# Patient Record
Sex: Female | Born: 1955 | Race: White | Hispanic: No | Marital: Married | Smoking: Never smoker
Health system: Southern US, Community
[De-identification: ages and names within clinical notes are randomized; demographics above are authoritative.]

## PROBLEM LIST (undated history)

## (undated) DIAGNOSIS — R209 Unspecified disturbances of skin sensation: Principal | ICD-10-CM

## (undated) DIAGNOSIS — M25552 Pain in left hip: Secondary | ICD-10-CM

## (undated) DIAGNOSIS — M1990 Unspecified osteoarthritis, unspecified site: Secondary | ICD-10-CM

## (undated) DIAGNOSIS — T8859XA Other complications of anesthesia, initial encounter: Secondary | ICD-10-CM

## (undated) DIAGNOSIS — T4145XA Adverse effect of unspecified anesthetic, initial encounter: Secondary | ICD-10-CM

## (undated) DIAGNOSIS — F419 Anxiety disorder, unspecified: Secondary | ICD-10-CM

## (undated) DIAGNOSIS — E785 Hyperlipidemia, unspecified: Secondary | ICD-10-CM

## (undated) DIAGNOSIS — N200 Calculus of kidney: Secondary | ICD-10-CM

## (undated) DIAGNOSIS — I10 Essential (primary) hypertension: Secondary | ICD-10-CM

## (undated) DIAGNOSIS — R112 Nausea with vomiting, unspecified: Secondary | ICD-10-CM

## (undated) DIAGNOSIS — F32A Depression, unspecified: Secondary | ICD-10-CM

## (undated) DIAGNOSIS — Z87442 Personal history of urinary calculi: Secondary | ICD-10-CM

## (undated) DIAGNOSIS — Z9889 Other specified postprocedural states: Secondary | ICD-10-CM

## (undated) HISTORY — DX: Anxiety disorder, unspecified: F41.9

## (undated) HISTORY — PX: HIP SURGERY: SHX245

## (undated) HISTORY — PX: OVARIAN CYST REMOVAL: SHX89

## (undated) HISTORY — PX: TONSILLECTOMY: SUR1361

## (undated) HISTORY — PX: ABDOMINAL HYSTERECTOMY: SHX81

## (undated) HISTORY — DX: Unspecified disturbances of skin sensation: R20.9

---

## 1998-04-29 ENCOUNTER — Observation Stay (HOSPITAL_COMMUNITY): Admission: RE | Admit: 1998-04-29 | Discharge: 1998-04-30 | Payer: Self-pay | Admitting: Obstetrics & Gynecology

## 2000-05-17 ENCOUNTER — Other Ambulatory Visit: Admission: RE | Admit: 2000-05-17 | Discharge: 2000-05-17 | Payer: Self-pay | Admitting: Obstetrics & Gynecology

## 2001-11-21 ENCOUNTER — Other Ambulatory Visit: Admission: RE | Admit: 2001-11-21 | Discharge: 2001-11-21 | Payer: Self-pay | Admitting: Obstetrics & Gynecology

## 2002-11-27 ENCOUNTER — Other Ambulatory Visit: Admission: RE | Admit: 2002-11-27 | Discharge: 2002-11-27 | Payer: Self-pay | Admitting: Obstetrics & Gynecology

## 2003-12-24 ENCOUNTER — Other Ambulatory Visit: Admission: RE | Admit: 2003-12-24 | Discharge: 2003-12-24 | Payer: Self-pay | Admitting: Obstetrics & Gynecology

## 2005-03-17 ENCOUNTER — Other Ambulatory Visit: Admission: RE | Admit: 2005-03-17 | Discharge: 2005-03-17 | Payer: Self-pay | Admitting: Obstetrics & Gynecology

## 2006-05-04 ENCOUNTER — Encounter: Admission: RE | Admit: 2006-05-04 | Discharge: 2006-05-04 | Payer: Self-pay | Admitting: Family Medicine

## 2006-07-12 ENCOUNTER — Encounter: Admission: RE | Admit: 2006-07-12 | Discharge: 2006-07-12 | Payer: Self-pay | Admitting: Interventional Radiology

## 2006-07-21 ENCOUNTER — Encounter: Admission: RE | Admit: 2006-07-21 | Discharge: 2006-07-21 | Payer: Self-pay | Admitting: Interventional Radiology

## 2007-03-14 ENCOUNTER — Encounter: Admission: RE | Admit: 2007-03-14 | Discharge: 2007-03-14 | Payer: Self-pay | Admitting: Interventional Radiology

## 2010-07-26 ENCOUNTER — Encounter: Payer: Self-pay | Admitting: Interventional Radiology

## 2011-07-12 ENCOUNTER — Encounter (HOSPITAL_COMMUNITY): Payer: Self-pay | Admitting: Pharmacy Technician

## 2011-07-13 NOTE — H&P (Signed)
  Sheila Allen DOB: 1956-03-12  Chief Complaint: Right hip and leg pain   History of Present Illness The patient is a 56 year old female who presents today for follow up of their hip. The patient is being followed for their left hip pain and trochanteric bursitis. They are 7 week(s) out from since last injection. Symptoms reported today include: pain and pain at night. The patient feels that they are doing poorly and report their pain level to be moderate. Current treatment includes: physical therapy (only went to sessions). The patient has reported improvement of their symptoms with: Cortisone injections (helped for 2weeks). Patient states now having buttock pain with no radiation down the left leg  Problem List/Past Medical Pain in joint, pelvis/thigh (719.45). 04/16/2005 Enthesopathy, hip (726.5). 04/16/2005 Enthesopathy, hip (726.5). 11/02/2010   Allergies No Known Drug Allergies. 03/03/2011  Family History None. 03/03/2011  Social History Tobacco use. Never smoker. Alcohol use. Occasional alcohol use.  Medication History hormone patch Active. Hydrochlorothiazide (12.5MG  Capsule, Oral) Active. Aleve (220MG  Capsule, 1 Oral) Active.  Review of Systems General:Not Present- Chills, Fever, Night Sweats, Fatigue, Weight Gain, Weight Loss and Memory Loss. Skin:Not Present- Hives, Itching, Rash, Eczema and Lesions. HEENT:Not Present- Tinnitus, Headache, Double Vision, Visual Loss, Hearing Loss and Dentures. Respiratory:Not Present- Shortness of breath with exertion, Shortness of breath at rest, Allergies, Coughing up blood and Chronic Cough. Cardiovascular:Not Present- Chest Pain, Racing/skipping heartbeats, Difficulty Breathing Lying Down, Murmur, Swelling and Palpitations. Gastrointestinal:Not Present- Bloody Stool, Heartburn, Abdominal Pain, Vomiting, Nausea, Constipation, Diarrhea, Difficulty Swallowing, Jaundice and Loss of appetitie. Female  Genitourinary:Not Present- Blood in Urine, Urinary frequency, Weak urinary stream, Discharge, Flank Pain, Incontinence, Painful Urination, Urgency, Urinary Retention and Urinating at Night. Musculoskeletal:Present- Muscle Pain and Spasms. Not Present- Muscle Weakness, Joint Swelling, Joint Pain, Back Pain and Morning Stiffness. Neurological:Not Present- Tremor, Dizziness, Blackout spells, Paralysis, Difficulty with balance and Weakness. Psychiatric:Not Present- Insomnia.  Physical Exam  She is a well-developed, well-nourished 55 year old female. She is alert and oriented x 3. No acute distress. She is still having severe pain with palpation over that greater trochanteric bursa. She has mild pain with abduction of the left hip. No pain with flexion, internal or external rotation. Sensation and circulation intact. No motor deficits. Calves are soft and nontender.  Lungs clear to auscultation. Heart sounds normal. Regular rate and rhythm. No murmurs. Abdomen soft and nontender. Bowel sounds active. Neurologically grossly intact. Neck supple. No carotid bruit. EOM intact.   RADIOGRAPHS:  Dr. Darrelyn Hillock put a BB marker on it, took an x-ray. The pain is well below the greater trochanter where the insertion of the gluteus medius is. She had a previous MRI that shows a partial gluteus medius tear there.  Assessment & Plan Enthesopathy, hip (726.5)  Plans  She is really tired of the injections, they are not lasting long. The only other procedure would be surgery and we discussed that. Dr. Darrelyn Hillock told her it is a 50/50 chance and also there is a possibility of infection which is rare. We would have to put her in overnight observation. We will go ahead and schedule her for iliotibial band release and repair of the gluteus medius left hip. We will do it under general anesthesia.   Dimitri Ped, PA-C

## 2011-07-15 ENCOUNTER — Encounter (HOSPITAL_COMMUNITY): Payer: Self-pay

## 2011-07-15 ENCOUNTER — Ambulatory Visit (HOSPITAL_COMMUNITY)
Admission: RE | Admit: 2011-07-15 | Discharge: 2011-07-15 | Disposition: A | Discharge status: Home / Self Care | Payer: Federal, State, Local not specified - PPO | Source: Ambulatory Visit | Attending: Orthopedic Surgery | Admitting: Orthopedic Surgery

## 2011-07-15 ENCOUNTER — Encounter (HOSPITAL_COMMUNITY)
Admission: RE | Admit: 2011-07-15 | Discharge: 2011-07-15 | Disposition: A | Discharge status: Home / Self Care | Payer: Federal, State, Local not specified - PPO | Source: Ambulatory Visit | Attending: Orthopedic Surgery | Admitting: Orthopedic Surgery

## 2011-07-15 DIAGNOSIS — Z01818 Encounter for other preprocedural examination: Secondary | ICD-10-CM | POA: Insufficient documentation

## 2011-07-15 DIAGNOSIS — Z01812 Encounter for preprocedural laboratory examination: Secondary | ICD-10-CM | POA: Insufficient documentation

## 2011-07-15 HISTORY — DX: Pain in left hip: M25.552

## 2011-07-15 HISTORY — DX: Essential (primary) hypertension: I10

## 2011-07-15 LAB — DIFFERENTIAL
Basophils Absolute: 0 10*3/uL (ref 0.0–0.1)
Basophils Relative: 1 % (ref 0–1)
Eosinophils Absolute: 0.3 10*3/uL (ref 0.0–0.7)
Eosinophils Relative: 5 % (ref 0–5)
Lymphocytes Relative: 26 % (ref 12–46)
Lymphs Abs: 1.7 10*3/uL (ref 0.7–4.0)
Monocytes Absolute: 0.6 10*3/uL (ref 0.1–1.0)
Monocytes Relative: 9 % (ref 3–12)
Neutro Abs: 3.7 10*3/uL (ref 1.7–7.7)
Neutrophils Relative %: 59 % (ref 43–77)

## 2011-07-15 LAB — URINALYSIS, ROUTINE W REFLEX MICROSCOPIC
Bilirubin Urine: NEGATIVE
Glucose, UA: NEGATIVE mg/dL
Hgb urine dipstick: NEGATIVE
Ketones, ur: NEGATIVE mg/dL
Leukocytes, UA: NEGATIVE
Nitrite: NEGATIVE
Protein, ur: NEGATIVE mg/dL
Specific Gravity, Urine: 1.023 (ref 1.005–1.030)
Urobilinogen, UA: 1 mg/dL (ref 0.0–1.0)
pH: 7 (ref 5.0–8.0)

## 2011-07-15 LAB — CBC
HCT: 37.3 % (ref 36.0–46.0)
Hemoglobin: 12.6 g/dL (ref 12.0–15.0)
MCH: 29.1 pg (ref 26.0–34.0)
MCHC: 33.8 g/dL (ref 30.0–36.0)
MCV: 86.1 fL (ref 78.0–100.0)
Platelets: 396 10*3/uL (ref 150–400)
RBC: 4.33 MIL/uL (ref 3.87–5.11)
RDW: 12.7 % (ref 11.5–15.5)
WBC: 6.4 10*3/uL (ref 4.0–10.5)

## 2011-07-15 LAB — APTT: aPTT: 38 seconds — ABNORMAL HIGH (ref 24–37)

## 2011-07-15 LAB — COMPREHENSIVE METABOLIC PANEL
ALT: 25 U/L (ref 0–35)
AST: 23 U/L (ref 0–37)
Albumin: 3.7 g/dL (ref 3.5–5.2)
Alkaline Phosphatase: 63 U/L (ref 39–117)
BUN: 16 mg/dL (ref 6–23)
CO2: 32 mEq/L (ref 19–32)
Calcium: 9.5 mg/dL (ref 8.4–10.5)
Chloride: 102 mEq/L (ref 96–112)
Creatinine, Ser: 0.88 mg/dL (ref 0.50–1.10)
GFR calc Af Amer: 84 mL/min — ABNORMAL LOW (ref >90)
GFR calc non Af Amer: 72 mL/min — ABNORMAL LOW (ref >90)
Glucose, Bld: 96 mg/dL (ref 70–99)
Potassium: 3.5 mEq/L (ref 3.5–5.1)
Sodium: 141 mEq/L (ref 135–145)
Total Bilirubin: 0.6 mg/dL (ref 0.3–1.2)
Total Protein: 7.1 g/dL (ref 6.0–8.3)

## 2011-07-15 LAB — PROTIME-INR
INR: 0.97 (ref 0.00–1.49)
Prothrombin Time: 13.1 seconds (ref 11.6–15.2)

## 2011-07-15 NOTE — Patient Instructions (Addendum)
20 Sheila Allen  07/15/2011   Your procedure is scheduled on: 07/20/11  Report to [facility] at 10:00 AM.  Call this number if you have problems the morning of surgery: [phone]   Remember:   Do not eat food:After Midnight.  May have clear liquids: up to 4 Hours before arrival.(6:00 AM)  Clear liquids include soda, tea, black coffee, apple or grape juice, broth.  Take these medicines the morning of surgery with A SIP OF WATER: NONE   Do not wear jewelry, make-up or nail polish.  Do not wear lotions, powders, or perfumes. You may wear deodorant.  Do not shave 48 hours prior to surgery.  Do not bring valuables to the hospital.  Contacts, dentures or bridgework may not be worn into surgery.  Leave suitcase in the car. After surgery it may be brought to your room.  For patients admitted to the hospital, checkout time is 11:00 AM the day of discharge.   Patients discharged the day of surgery will not be allowed to drive home.  Name and phone number of your driver:   Special Instructions: CHG Shower Use Special Wash: 1/2 bottle night before surgery and 1/2 bottle morning of surgery.   Please read over the following fact sheets that you were given: MRSA Information

## 2011-07-20 ENCOUNTER — Encounter (HOSPITAL_COMMUNITY): Payer: Self-pay | Admitting: *Deleted

## 2011-07-20 ENCOUNTER — Encounter (HOSPITAL_COMMUNITY)
Admission: RE | Disposition: A | Discharge status: Home / Self Care | Payer: Self-pay | Source: Ambulatory Visit | Attending: Orthopedic Surgery

## 2011-07-20 ENCOUNTER — Ambulatory Visit (HOSPITAL_COMMUNITY): Payer: Federal, State, Local not specified - PPO | Admitting: *Deleted

## 2011-07-20 ENCOUNTER — Ambulatory Visit (HOSPITAL_COMMUNITY)
Admission: RE | Admit: 2011-07-20 | Discharge: 2011-07-21 | Disposition: A | Discharge status: Home / Self Care | Payer: Federal, State, Local not specified - PPO | Source: Ambulatory Visit | Attending: Orthopedic Surgery | Admitting: Orthopedic Surgery

## 2011-07-20 DIAGNOSIS — X58XXXA Exposure to other specified factors, initial encounter: Secondary | ICD-10-CM | POA: Insufficient documentation

## 2011-07-20 DIAGNOSIS — M25559 Pain in unspecified hip: Secondary | ICD-10-CM | POA: Insufficient documentation

## 2011-07-20 DIAGNOSIS — Z9889 Other specified postprocedural states: Secondary | ICD-10-CM

## 2011-07-20 DIAGNOSIS — Z90710 Acquired absence of both cervix and uterus: Secondary | ICD-10-CM | POA: Insufficient documentation

## 2011-07-20 DIAGNOSIS — M7072 Other bursitis of hip, left hip: Secondary | ICD-10-CM | POA: Diagnosis present

## 2011-07-20 DIAGNOSIS — R112 Nausea with vomiting, unspecified: Secondary | ICD-10-CM | POA: Insufficient documentation

## 2011-07-20 DIAGNOSIS — IMO0002 Reserved for concepts with insufficient information to code with codable children: Secondary | ICD-10-CM | POA: Insufficient documentation

## 2011-07-20 DIAGNOSIS — M76899 Other specified enthesopathies of unspecified lower limb, excluding foot: Secondary | ICD-10-CM | POA: Insufficient documentation

## 2011-07-20 LAB — TYPE AND SCREEN
ABO/RH(D): A NEG
Antibody Screen: NEGATIVE

## 2011-07-20 LAB — ABO/RH: ABO/RH(D): A NEG

## 2011-07-20 SURGERY — EXCISION BURSA WITH ILIAL-TIBIAL BAND RELEASE
Anesthesia: General | Site: Hip | Laterality: Left | Wound class: Clean

## 2011-07-20 MED ORDER — HYDROMORPHONE HCL PF 1 MG/ML IJ SOLN
0.2500 mg | INTRAMUSCULAR | Status: DC | PRN
Start: 2011-07-20 — End: 2011-07-20
  Administered 2011-07-20 (×4): 0.5 mg via INTRAVENOUS

## 2011-07-20 MED ORDER — PROPOFOL 10 MG/ML IV EMUL
INTRAVENOUS | Status: DC | PRN
  Administered 2011-07-20: 170 mg via INTRAVENOUS

## 2011-07-20 MED ORDER — CEFAZOLIN SODIUM 1-5 GM-% IV SOLN
1.0000 g | Freq: Four times a day (QID) | INTRAVENOUS | Status: AC
  Administered 2011-07-20 – 2011-07-21 (×3): 1 g via INTRAVENOUS
  Filled 2011-07-20 (×3): qty 50

## 2011-07-20 MED ORDER — METHOCARBAMOL 100 MG/ML IJ SOLN
500.0000 mg | Freq: Four times a day (QID) | INTRAVENOUS | Status: DC | PRN
  Administered 2011-07-20: 500 mg via INTRAVENOUS
  Filled 2011-07-20: qty 5

## 2011-07-20 MED ORDER — LACTATED RINGERS IV SOLN
INTRAVENOUS | Status: DC
  Administered 2011-07-20: 1000 mL via INTRAVENOUS
  Administered 2011-07-20: 14:00:00 via INTRAVENOUS

## 2011-07-20 MED ORDER — HYDROCODONE-ACETAMINOPHEN 5-325 MG PO TABS: 1.0000 | ORAL_TABLET | ORAL | Status: DC | PRN

## 2011-07-20 MED ORDER — PROMETHAZINE HCL 25 MG/ML IJ SOLN
12.5000 mg | Freq: Four times a day (QID) | INTRAMUSCULAR | Status: DC | PRN
  Administered 2011-07-20: 12.5 mg via INTRAVENOUS
  Filled 2011-07-20: qty 1

## 2011-07-20 MED ORDER — POLYETHYLENE GLYCOL 3350 17 G PO PACK
17.0000 g | PACK | Freq: Every day | ORAL | Status: DC | PRN
  Filled 2011-07-20: qty 1

## 2011-07-20 MED ORDER — FENTANYL CITRATE 0.05 MG/ML IJ SOLN
INTRAMUSCULAR | Status: DC | PRN
  Administered 2011-07-20: 50 ug via INTRAVENOUS
  Administered 2011-07-20: 100 ug via INTRAVENOUS
  Administered 2011-07-20: 25 ug via INTRAVENOUS

## 2011-07-20 MED ORDER — PHENOL 1.4 % MT LIQD
1.0000 | OROMUCOSAL | Status: DC | PRN
  Filled 2011-07-20: qty 177

## 2011-07-20 MED ORDER — ONDANSETRON HCL 4 MG PO TABS: 4.0000 mg | ORAL_TABLET | Freq: Four times a day (QID) | ORAL | Status: DC | PRN

## 2011-07-20 MED ORDER — HYDROCHLOROTHIAZIDE 25 MG PO TABS
25.0000 mg | ORAL_TABLET | Freq: Every day | ORAL | Status: DC
  Administered 2011-07-21: 25 mg via ORAL
  Filled 2011-07-20 (×3): qty 1

## 2011-07-20 MED ORDER — HYDROMORPHONE HCL PF 1 MG/ML IJ SOLN
0.5000 mg | INTRAMUSCULAR | Status: DC | PRN
  Administered 2011-07-20 – 2011-07-21 (×4): 1 mg via INTRAVENOUS
  Filled 2011-07-20 (×4): qty 1

## 2011-07-20 MED ORDER — CEFAZOLIN SODIUM 1-5 GM-% IV SOLN
1.0000 g | INTRAVENOUS | Status: AC
  Administered 2011-07-20: 1 g via INTRAVENOUS

## 2011-07-20 MED ORDER — METHOCARBAMOL 500 MG PO TABS: 500.0000 mg | ORAL_TABLET | Freq: Four times a day (QID) | ORAL | Status: DC | PRN

## 2011-07-20 MED ORDER — PROMETHAZINE HCL 25 MG/ML IJ SOLN: 6.2500 mg | INTRAMUSCULAR | Status: DC | PRN

## 2011-07-20 MED ORDER — ACETAMINOPHEN 10 MG/ML IV SOLN
INTRAVENOUS | Status: DC | PRN
  Administered 2011-07-20: 1000 mg via INTRAVENOUS

## 2011-07-20 MED ORDER — SODIUM CHLORIDE 0.9 % IR SOLN
Status: DC | PRN
  Administered 2011-07-20: 13:00:00

## 2011-07-20 MED ORDER — MENTHOL 3 MG MT LOZG
1.0000 | LOZENGE | OROMUCOSAL | Status: DC | PRN
  Filled 2011-07-20: qty 9

## 2011-07-20 MED ORDER — HYDROMORPHONE HCL PF 1 MG/ML IJ SOLN
INTRAMUSCULAR | Status: AC
  Filled 2011-07-20: qty 1

## 2011-07-20 MED ORDER — LACTATED RINGERS IV SOLN
INTRAVENOUS | Status: DC
  Administered 2011-07-20 – 2011-07-21 (×3): via INTRAVENOUS

## 2011-07-20 MED ORDER — SUCCINYLCHOLINE CHLORIDE 20 MG/ML IJ SOLN
INTRAMUSCULAR | Status: DC | PRN
  Administered 2011-07-20: 100 mg via INTRAVENOUS

## 2011-07-20 MED ORDER — MIDAZOLAM HCL 5 MG/5ML IJ SOLN
INTRAMUSCULAR | Status: DC | PRN
  Administered 2011-07-20: 2 mg via INTRAVENOUS

## 2011-07-20 MED ORDER — BISACODYL 10 MG RE SUPP: 10.0000 mg | Freq: Every day | RECTAL | Status: DC | PRN

## 2011-07-20 MED ORDER — FLEET ENEMA 7-19 GM/118ML RE ENEM: 1.0000 | ENEMA | Freq: Once | RECTAL | Status: AC | PRN

## 2011-07-20 MED ORDER — ASPIRIN 325 MG PO TABS
325.0000 mg | ORAL_TABLET | Freq: Two times a day (BID) | ORAL | Status: DC
  Administered 2011-07-20: 325 mg via ORAL
  Filled 2011-07-20 (×5): qty 1

## 2011-07-20 MED ORDER — OXYCODONE-ACETAMINOPHEN 5-325 MG PO TABS
1.0000 | ORAL_TABLET | ORAL | Status: DC | PRN
  Administered 2011-07-21: 2 via ORAL
  Filled 2011-07-20: qty 2

## 2011-07-20 MED ORDER — ONDANSETRON HCL 4 MG/2ML IJ SOLN
4.0000 mg | Freq: Four times a day (QID) | INTRAMUSCULAR | Status: DC | PRN
  Administered 2011-07-20 – 2011-07-21 (×2): 4 mg via INTRAVENOUS
  Filled 2011-07-20 (×2): qty 2

## 2011-07-20 SURGICAL SUPPLY — 28 items
BAG SPEC THK2 15X12 ZIP CLS (MISCELLANEOUS) ×1
BAG ZIPLOCK 12X15 (MISCELLANEOUS) ×2 IMPLANT
CLOSURE STERI STRIP 1/2 X4 (GAUZE/BANDAGES/DRESSINGS) ×2 IMPLANT
CLOTH BEACON ORANGE TIMEOUT ST (SAFETY) ×2 IMPLANT
DRAPE ORTHO SPLIT 77X108 STRL (DRAPES) ×2
DRAPE SURG ORHT 6 SPLT 77X108 (DRAPES) ×1 IMPLANT
DRAPE U-SHAPE 47X51 STRL (DRAPES) ×1 IMPLANT
DRSG MEPILEX BORDER 4X8 (GAUZE/BANDAGES/DRESSINGS) ×2 IMPLANT
DURAPREP 26ML APPLICATOR (WOUND CARE) ×2 IMPLANT
ELECT BLADE TIP CTD 4 INCH (ELECTRODE) ×1 IMPLANT
ELECT REM PT RETURN 9FT ADLT (ELECTROSURGICAL) ×2
ELECTRODE REM PT RTRN 9FT ADLT (ELECTROSURGICAL) ×1 IMPLANT
GLOVE BIOGEL PI IND STRL 8.5 (GLOVE) ×1 IMPLANT
GLOVE BIOGEL PI INDICATOR 8.5 (GLOVE) ×1
GLOVE ECLIPSE 8.0 STRL XLNG CF (GLOVE) ×2 IMPLANT
GOWN PREVENTION PLUS LG XLONG (DISPOSABLE) ×3 IMPLANT
GOWN STRL REIN XL XLG (GOWN DISPOSABLE) ×3 IMPLANT
KIT BASIN OR (CUSTOM PROCEDURE TRAY) ×2 IMPLANT
MANIFOLD NEPTUNE II (INSTRUMENTS) ×2 IMPLANT
NS IRRIG 1000ML POUR BTL (IV SOLUTION) IMPLANT
PACK TOTAL JOINT (CUSTOM PROCEDURE TRAY) ×2 IMPLANT
POSITIONER SURGICAL ARM (MISCELLANEOUS) ×2 IMPLANT
SPONGE GAUZE 4X4 12PLY (GAUZE/BANDAGES/DRESSINGS) ×2 IMPLANT
SUT VIC AB 0 CT1 36 (SUTURE) ×2 IMPLANT
SUT VIC AB 1 CT1 27 (SUTURE) ×6
SUT VIC AB 1 CT1 27XBRD ANTBC (SUTURE) ×3 IMPLANT
TOWEL OR 17X26 10 PK STRL BLUE (TOWEL DISPOSABLE) ×4 IMPLANT
WATER STERILE IRR 1500ML POUR (IV SOLUTION) IMPLANT

## 2011-07-20 NOTE — Op Note (Signed)
Sheila Allen, Sheila Allen NO.:  [id number]  MEDICAL RECORD NO.:  [id number]  LOCATION:  1435                         FACILITY:  [facility]  PHYSICIAN:  Georges Lynch. Lynard Postlewait, M.D.DATE OF BIRTH:  1956-04-10  DATE OF PROCEDURE:  07/20/2011 DATE OF DISCHARGE:                              OPERATIVE REPORT   SURGEON:  Georges Lynch. Darrelyn Hillock, MD  ASSISTANT:  Dimitri Ped, PA  PREOPERATIVE DIAGNOSES: 1. Chronic greater trochanteric bursitis, left hip. 2. Partial tear of the gluteus medius, left hip.  POSTOPERATIVE DIAGNOSES: 1. Chronic greater trochanteric bursitis, left hip. 2. Partial tear of the gluteus medius, left hip.  OPERATION DONE: 1. Repair of a small tear of the gluteus medius, left hip. 2. An elliptical excision of a portion of the iliotibial band. 3. Bursectomy of the greater trochanteric bursa.  PROCEDURE IN DETAIL:  Under general anesthesia, routine orthopedic prepping and draping of the left hip was carried out with the right hip down and left side up.  The appropriate time-out was carried out prior to the surgery.  I also marked the appropriate left hip in the holding area.  The patient had 1 g of IV Ancef.  At this time, an incision was made over the lateral aspect of the left hip.  Bleeders identified and cauterized.  I carefully dissected the subcutaneous tissue down off the iliotibial band.  I then took an elliptical excision of a portion of the iliotibial band.  Note, this was under a severe amount of tension.  Once this was done, I went down and identified the erythematous bursa, I removed the erythematous greater trochanteric bursa.  There was a small tear in the gluteus medius that I repaired it well.  I thoroughly irrigated out the area.  I left the remaining part of the iliotibial band open.  I then closed the subcutaneous with 0 Vicryl and continued to close the remaining part of the wound in usual fashion.     ______________________________ Georges Lynch Darrelyn Hillock, M.D.     RAG/MEDQ  D:  07/20/2011  T:  07/20/2011  Job:  [id number]

## 2011-07-20 NOTE — Interval H&P Note (Signed)
History and Physical Interval Note:  07/20/2011 12:45 PM  Sheila Allen  has presented today for surgery, with the diagnosis of left hip pain  The various methods of treatment have been discussed with the patient and family. After consideration of risks, benefits and other options for treatment, the patient has consented to  Procedure(s): EXCISION BURSA WITH ILIAL-TIBIAL BAND RELEASE as a surgical intervention .  The patients' history has been reviewed, patient examined, no change in status, stable for surgery.  I have reviewed the patients' chart and labs.  Questions were answered to the patient's satisfaction.     Severa Jeremiah A

## 2011-07-20 NOTE — Progress Notes (Signed)
Pt c/o n/v. zofran not due until 1045. Pa shufford called orders received pt informed.

## 2011-07-20 NOTE — Brief Op Note (Signed)
07/20/2011  1:43 PM  PATIENT:  Sheila Allen  56 y.o. female  PRE-OPERATIVE DIAGNOSIS:  left hip pain  POST-OPERATIVE DIAGNOSIS:  left hip pain  PROCEDURE:  Procedure(s): EXCISION BURSA WITH ILIAL-TIBIAL BAND RELEASE  SURGEON:  Surgeon(s): Chanti Golubski A Consuelo Thayne  PHYSICIAN ASSISTANT:   ASSISTANTS: Pharmacist, community PA   ANESTHESIA:   general  EBL:   10cc  BLOOD ADMINISTERED:none  DRAINS: none   LOCAL MEDICATIONS USED:  NONE  SPECIMEN:  No Specimen  DISPOSITION OF SPECIMEN:  N/A  COUNTS:  YES  TOURNIQUET:  * No tourniquets in log *  DICTATION: .Other Dictation: Dictation Number [id number]  PLAN OF CARE: Admit for overnight observation

## 2011-07-20 NOTE — Anesthesia Preprocedure Evaluation (Addendum)
Anesthesia Evaluation  Patient identified by MRN, date of birth, ID band Patient awake    Reviewed: Allergy & Precautions, H&P , NPO status , Patient's Chart, lab work & pertinent test results, reviewed documented beta blocker date and time   Airway Mallampati: II  Neck ROM: Full    Dental  (+) Teeth Intact   Pulmonary neg pulmonary ROS,  clear to auscultation        Cardiovascular hypertension, Pt. on medications Regular Normal Denies cardiac dz   Neuro/Psych Negative Neurological ROS  Negative Psych ROS   GI/Hepatic negative GI ROS, Neg liver ROS,   Endo/Other  Negative Endocrine ROS  Renal/GU negative Renal ROS  Genitourinary negative   Musculoskeletal negative musculoskeletal ROS (+)   Abdominal   Peds negative pediatric ROS (+)  Hematology negative hematology ROS (+)   Anesthesia Other Findings   Reproductive/Obstetrics negative OB ROS                           Anesthesia Physical Anesthesia Plan  ASA: II  Anesthesia Plan: General   Post-op Pain Management:    Induction: Intravenous  Airway Management Planned: Oral ETT  Additional Equipment:   Intra-op Plan:   Post-operative Plan: Extubation in OR  Informed Consent: I have reviewed the patients History and Physical, chart, labs and discussed the procedure including the risks, benefits and alternatives for the proposed anesthesia with the patient or authorized representative who has indicated his/her understanding and acceptance.     Plan Discussed with: CRNA and Surgeon  Anesthesia Plan Comments:        Anesthesia Quick Evaluation

## 2011-07-20 NOTE — Transfer of Care (Signed)
Immediate Anesthesia Transfer of Care Note  Patient: Sheila Allen  Procedure(s) Performed:  EXCISION BURSA WITH ILIAL-TIBIAL BAND RELEASE - Release of Iliotibial band, left hip, bursectomy  Patient Location: PACU  Anesthesia Type: General  Level of Consciousness: sedated and patient cooperative  Airway & Oxygen Therapy: Patient Spontanous Breathing and Patient connected to face mask oxygen  Post-op Assessment: Report given to PACU RN and Post -op Vital signs reviewed and stable  Post vital signs: Reviewed and stable Filed Vitals:   07/20/11 0958  BP: 131/96  Pulse: 86  Temp: 36.4 C  Resp: 20    Complications: No apparent anesthesia complications

## 2011-07-20 NOTE — Anesthesia Postprocedure Evaluation (Signed)
  Anesthesia Post-op Note  Patient: Sheila Allen  Procedure(s) Performed:  EXCISION BURSA WITH ILIAL-TIBIAL BAND RELEASE - Release of Iliotibial band, left hip, bursectomy  Patient Location: PACU  Anesthesia Type: General  Level of Consciousness: oriented and sedated  Airway and Oxygen Therapy: Patient Spontanous Breathing and Patient connected to nasal cannula oxygen  Post-op Pain: mild  Post-op Assessment: Post-op Vital signs reviewed, Patient's Cardiovascular Status Stable, Respiratory Function Stable and Patent Airway  Post-op Vital Signs: stable  Complications: No apparent anesthesia complications

## 2011-07-21 MED ORDER — ASPIRIN EC 325 MG PO TBEC
325.0000 mg | DELAYED_RELEASE_TABLET | Freq: Two times a day (BID) | ORAL | Status: DC
  Administered 2011-07-21: 325 mg via ORAL
  Filled 2011-07-21 (×4): qty 1

## 2011-07-21 NOTE — Progress Notes (Signed)
Discussed with the patient and all questioned fully answered. Pt out of unit in w/c with family and tech to transport to car. Pt w/o any pain or distress.

## 2011-07-21 NOTE — Discharge Summary (Signed)
Physician Discharge Summary   Patient ID: Sheila Allen MRN: [id number] DOB/AGE: 1955/12/14 56 y.o.  Admit date: 07/20/2011 Discharge date: 07/21/2011  Primary Diagnosis: S/P Left Hip Bursectomy    Admission Diagnoses: Past Medical History  Diagnosis Date  . Hypertension   . Hip pain, left     Discharge Diagnoses:  Active Problems:  Bursitis of left hip S/P Left Hip Bursectomy   Procedure: Procedure(s) (LRB): EXCISION BURSA WITH ILIAL-TIBIAL BAND RELEASE (Left)   Consults: none  HPI:  Hip Bursitis Pain Patient has a history of left hip bursitis pain. Onset of the symptoms was several years ago. She had relief with cortisone injections for many years, but over the last several months, the patient has not been able to have relief with the injections. She was admitted for a left hip bursectomy with IT band release on Tuesday 07/20/2011.     Laboratory Data: Hospital Outpatient Visit on 07/15/2011  Component Date Value Range Status  . MRSA, PCR  07/15/2011 NEGATIVE  NEGATIVE Final  . Staphylococcus aureus  07/15/2011 NEGATIVE  NEGATIVE Final   Comment:                                 The Xpert SA Assay (FDA                          approved for NASAL specimens                          only), is one component of                          a comprehensive surveillance                          program.  It is not intended                          to diagnose infection nor to                          guide or monitor treatment.  Marland Kitchen aPTT (seconds) 07/15/2011 38* 24-37 Final   Comment:                                 IF BASELINE aPTT IS ELEVATED,                          SUGGEST PATIENT RISK ASSESSMENT                          BE USED TO DETERMINE APPROPRIATE                          ANTICOAGULANT THERAPY.  . WBC (K/uL) 07/15/2011 6.4  4.0-10.5 Final  . RBC (MIL/uL) 07/15/2011 4.33  3.87-5.11 Final  . Hemoglobin (g/dL) 95/62/1308 65.7  84.6-96.2 Final  . HCT (%) 07/15/2011  37.3  36.0-46.0 Final  . MCV (fL) 07/15/2011 86.1  78.0-100.0 Final  . MCH (pg) 07/15/2011 29.1  26.0-34.0 Final  . MCHC (g/dL) 95/28/4132 44.0  10.2-72.0  Final  . RDW (%) 07/15/2011 12.7  11.5-15.5 Final  . Platelets (K/uL) 07/15/2011 396  150-400 Final  . Sodium (mEq/L) 07/15/2011 141  135-145 Final  . Potassium (mEq/L) 07/15/2011 3.5  3.5-5.1 Final  . Chloride (mEq/L) 07/15/2011 102  96-112 Final  . CO2 (mEq/L) 07/15/2011 32  19-32 Final  . Glucose, Bld (mg/dL) 82/95/6213 96  08-65 Final  . BUN (mg/dL) 78/46/9629 16  5-28 Final  . Creatinine, Ser (mg/dL) 41/32/4401 0.27  2.53-6.64 Final  . Calcium (mg/dL) 40/34/7425 9.5  9.5-63.8 Final  . Total Protein (g/dL) 75/64/3329 7.1  5.1-8.8 Final  . Albumin (g/dL) 41/66/0630 3.7  1.6-0.1 Final  . AST (U/L) 07/15/2011 23  0-37 Final  . ALT (U/L) 07/15/2011 25  0-35 Final  . Alkaline Phosphatase (U/L) 07/15/2011 63  39-117 Final  . Total Bilirubin (mg/dL) 09/32/3557 0.6  3.2-2.0 Final  . GFR calc non Af Amer (mL/min) 07/15/2011 72* >90 Final  . GFR calc Af Amer (mL/min) 07/15/2011 84* >90 Final   Comment:                                 The eGFR has been calculated                          using the CKD EPI equation.                          This calculation has not been                          validated in all clinical                          situations.                          eGFR's persistently                          <90 mL/min signify                          possible Chronic Kidney Disease.  Marland Kitchen Neutrophils Relative (%) 07/15/2011 59  43-77 Final  . Neutro Abs (K/uL) 07/15/2011 3.7  1.7-7.7 Final  . Lymphocytes Relative (%) 07/15/2011 26  12-46 Final  . Lymphs Abs (K/uL) 07/15/2011 1.7  0.7-4.0 Final  . Monocytes Relative (%) 07/15/2011 9  3-12 Final  . Monocytes Absolute (K/uL) 07/15/2011 0.6  0.1-1.0 Final  . Eosinophils Relative (%) 07/15/2011 5  0-5 Final  . Eosinophils Absolute (K/uL) 07/15/2011 0.3  0.0-0.7 Final  .  Basophils Relative (%) 07/15/2011 1  0-1 Final  . Basophils Absolute (K/uL) 07/15/2011 0.0  0.0-0.1 Final  . Prothrombin Time (seconds) 07/15/2011 13.1  11.6-15.2 Final  . INR  07/15/2011 0.97  0.00-1.49 Final  . Color, Urine  07/15/2011 YELLOW  YELLOW Final  . APPearance  07/15/2011 CLOUDY* CLEAR Final  . Specific Gravity, Urine  07/15/2011 1.023  1.005-1.030 Final  . pH  07/15/2011 7.0  5.0-8.0 Final  . Glucose, UA (mg/dL) 25/42/7062 NEGATIVE  NEGATIVE Final  . Hgb urine dipstick  07/15/2011 NEGATIVE  NEGATIVE Final  . Bilirubin Urine  07/15/2011 NEGATIVE  NEGATIVE  Final  . Ketones, ur (mg/dL) 16/04/9603 NEGATIVE  NEGATIVE Final  . Protein, ur (mg/dL) 54/03/8118 NEGATIVE  NEGATIVE Final  . Urobilinogen, UA (mg/dL) 14/78/2956 1.0  2.1-3.0 Final  . Nitrite  07/15/2011 NEGATIVE  NEGATIVE Final  . Leukocytes, UA  07/15/2011 NEGATIVE  NEGATIVE Final   MICROSCOPIC NOT DONE ON URINES WITH NEGATIVE PROTEIN, BLOOD, LEUKOCYTES, NITRITE, OR GLUCOSE <1000 mg/dL.    X-Rays:Dg Chest 2 View  07/15/2011  *RADIOLOGY REPORT*  Clinical Data: Preop.  CHEST - 2 VIEW  Comparison: None.  Findings: Trachea is midline.  Heart size normal.  Lungs are clear. No pleural fluid.  IMPRESSION: Negative.  Original Report Authenticated By: Reyes Ivan, M.D.     Hospital Course: Patient was admitted to [facility] and taken to the OR and underwent the above state procedure without complications.  Patient tolerated the procedure well and was later transferred to the recovery room and then to the  floor for postoperative care.  They were given PO and IV analgesics for pain control following their surgery. Patient had a rough night on the evening of surgery. She had terrible nausea and vomiting.  Dressing was changed and the incision was clean, dry, no drainage, or erythema. Patient began to feel better throughout the day post-op day one. She spoke with Dr. Darrelyn Hillock and stated that she was no longer nauseated and  able to go home. Patient stated that she had hydrocodone at home and did not need another prescription.     Discharge Medications: Prior to Admission medications   Medication Sig Start Date End Date Taking? Authorizing Provider  estradiol (VIVELLE-DOT) 0.075 MG/24HR Place 1 patch onto the skin 2 (two) times a week. Monday and Thursday     Yes Historical Provider, MD  hydrochlorothiazide (HYDRODIURIL) 25 MG tablet Take 25 mg by mouth daily before breakfast.     Yes Historical Provider, MD  ibuprofen (ADVIL,MOTRIN) 200 MG tablet Take 600 mg by mouth every 6 (six) hours as needed. Pain      Historical Provider, MD  naproxen sodium (ANAPROX) 220 MG tablet Take 440 mg by mouth 2 (two) times daily as needed. Pain      Historical Provider, MD    Diet: heart healthy  Activity: WBAT  Follow-up:in 2 days with Dr. Darrelyn Hillock  Disposition: Improved  Discharged Condition: good   Discharge Orders    Future Orders Please Complete By Expires   Diet - low sodium heart healthy      Call MD / Call 911      Comments:   If you experience chest pain or shortness of breath, CALL 911 and be transported to the hospital emergency room.  If you develope a fever above 101 F, pus (white drainage) or increased drainage or redness at the wound, or calf pain, call your surgeon's office.   Constipation Prevention      Comments:   Drink plenty of fluids.  Prune juice may be helpful.  You may use a stool softener, such as Colace (over the counter) 100 mg twice a day.  Use MiraLax (over the counter) for constipation as needed.   Increase activity slowly as tolerated      Weight Bearing as taught in Physical Therapy      Comments:   Use a walker or crutches as instructed.   Discharge instructions      Comments:   Walk with your walker. Full weight bearing as tolerated. Use crutches as needed until balance is improved.  Change your dressing daily after Friday appointment. Shower only, no tub bath. Call if any  temperatures greater than 101 or any wound complications: [phone] during the day and ask for Dr. Jeannetta Ellis nurse, [facility] [place].   Driving restrictions      Comments:   No driving while on narcotic pain medication     Current Discharge Medication List    CONTINUE these medications which have NOT CHANGED   Details  estradiol (VIVELLE-DOT) 0.075 MG/24HR Place 1 patch onto the skin 2 (two) times a week. Monday and Thursday      hydrochlorothiazide (HYDRODIURIL) 25 MG tablet Take 25 mg by mouth daily before breakfast.      ibuprofen (ADVIL,MOTRIN) 200 MG tablet Take 600 mg by mouth every 6 (six) hours as needed. Pain      naproxen sodium (ANAPROX) 220 MG tablet Take 440 mg by mouth 2 (two) times daily as needed. Pain           Signed: Davinity Fanara LAUREN 07/21/2011, 6:45 PM

## 2011-07-21 NOTE — Progress Notes (Signed)
Post-Op Day One Left Hip Bursectomy  Subjective: Sheila Allen had a difficult night last night. She reports that she has been severely nauseated much of the night. She was vomiting this morning during rounds. She reports that she has not yet been able to eat since after surgery. She denies shortness of breath and chest pain. Voiding well. She reports that she is in moderate pain, but well-controlled with meds. The patient was seen in rounds with Dr. Darrelyn Hillock.     Objective: Vital signs in last 24 hours: Temp:  [97 F (36.1 C)-98.7 F (37.1 C)] 98.7 F (37.1 C) (01/16 0503) Pulse Rate:  [71-95] 94  (01/16 0503) Resp:  [10-21] 20  (01/16 0503) BP: (110-156)/(64-96) 110/64 mmHg (01/16 0503) SpO2:  [95 %-100 %] 97 % (01/16 0503) Weight:  [77.16 kg (170 lb 1.7 oz)] 77.16 kg (170 lb 1.7 oz) (01/15 1603)  Intake/Output from previous day: 01/15 0701 - 01/16 0700 In: 2418.3 [I.V.:2318.3; IV Piggyback:100] Out: 550 [Urine:550]  Physical Exam ABD soft Neurovascular intact Sensation intact distally Intact pulses distally Dorsiflexion/Plantar flexion intact No cellulitis present Compartment soft Incision clean, dry, no drainage or erythema  Assessment/Plan: We had planned to discharge Sheila Allen this morning, but due to her severe nausea we will keep her today. She is to continue to receive Zofran for her nausea. We will see her again this afternoon. If she is feeling 100% better in regards to the nausea and vomiting we may be able to let her go home later this evening. Dressing changed today.    Josearmando Kuhnert LAUREN 07/21/2011, 7:47 AM

## 2011-07-21 NOTE — Progress Notes (Signed)
Physical Therapy Evaluation Patient Details Name: Sheila Allen MRN: [id number] DOB: 04/22/1956 Today's Date: 07/21/2011 Time: 147-829 Charge: EVII  Problem List:  Patient Active Problem List  Diagnoses  . Bursitis of left hip    Past Medical History:  Past Medical History  Diagnosis Date  . Hypertension   . Hip pain, left    Past Surgical History:  Past Surgical History  Procedure Date  . Abdominal hysterectomy   . Ovarian cyst removal     PT Assessment/Plan/Recommendation PT Assessment Clinical Impression Statement: Pt s/p left hip bursectomy and glut med repair.  Pt declined gait training with cane or crutch although she does demonstrate antalgic gait pattern.  Pt would benefit from one more PT visit to ensure no DME needs and perform stairs if pt is ready.  Pt declined performing stairs but PT discussed technique with pt and pt verbalized understanding.  May see for one more visit if pt remains in hospital, but feel pt would be safe to d/c home with spouse. PT Recommendation/Assessment: Patient will need skilled PT in the acute care venue PT Problem List: Decreased mobility;Pain;Decreased knowledge of use of DME;Decreased strength PT Therapy Diagnosis : Abnormality of gait;Acute pain PT Plan PT Frequency:  (1 more visit) PT Treatment/Interventions: DME instruction;Gait training;Stair training;Functional mobility training;Therapeutic exercise;Therapeutic activities;Patient/family education PT Recommendation Follow Up Recommendations: No PT follow up Equipment Recommended: None recommended by PT (Pt declines DME at this time.) PT Goals  Acute Rehab PT Goals PT Goal Formulation: With patient Time For Goal Achievement: 3 days Pt will Ambulate: 51 - 150 feet;with supervision;with least restrictive assistive device PT Goal: Ambulate - Progress: Goal set today Pt will Go Up / Down Stairs: 1-2 stairs;with supervision;with rail(s);with least restrictive assistive device PT  Goal: Up/Down Stairs - Progress: Goal set today  PT Evaluation Precautions/Restrictions    Prior Functioning  Home Living Lives With: Spouse Type of Home: House Home Layout: Two level;Able to live on main level with bedroom/bathroom (pt reports she can sleep on couch if needed) Home Access: Stairs to enter Entrance Stairs-Rails: Right Entrance Stairs-Number of Steps: 2 Home Adaptive Equipment: None Prior Function Level of Independence: Independent with basic ADLs;Independent with gait Cognition Cognition Arousal/Alertness: Awake/alert Overall Cognitive Status: Appears within functional limits for tasks assessed Sensation/Coordination   Extremity Assessment RLE Assessment RLE Assessment: Within Functional Limits LLE Assessment LLE Assessment: Within Functional Limits (upon observation, did not test hip 2* surgery) Mobility (including Balance) Bed Mobility Bed Mobility: Yes Supine to Sit: 6: Modified independent (Device/Increase time) Sit to Supine: 6: Modified independent (Device/Increase time) Transfers Transfers: Yes Sit to Stand: 5: Supervision;With upper extremity assist Sit to Stand Details (indicate cue type and reason): verbal cue for hand placement Stand to Sit: 5: Supervision;With upper extremity assist Ambulation/Gait Ambulation/Gait: Yes Ambulation/Gait Assistance: 4: Min assist Ambulation/Gait Assistance Details (indicate cue type and reason): min/guard to supervision, pt pushed IV pole and declined need for cane or crutch to assist with gait Ambulation Distance (Feet): 100 Feet Assistive device: None Gait Pattern: Step-to pattern;Antalgic;Decreased stance time - left    Exercise    End of Session PT - End of Session Activity Tolerance: Patient tolerated treatment well Patient left: in bed;with call bell in reach;with family/visitor present General Behavior During Session: [facility] for tasks performed Cognition: [facility] for tasks performed  Kunaal Walkins,KATHrine  E 07/21/2011, 9:49 AM Pager: [phone]

## 2012-09-14 ENCOUNTER — Ambulatory Visit
Admission: RE | Admit: 2012-09-14 | Discharge: 2012-09-14 | Disposition: A | Discharge status: Home / Self Care | Payer: Federal, State, Local not specified - PPO | Source: Ambulatory Visit | Attending: Family Medicine | Admitting: Family Medicine

## 2012-09-14 ENCOUNTER — Other Ambulatory Visit: Payer: Self-pay | Admitting: Family Medicine

## 2012-09-14 DIAGNOSIS — R1032 Left lower quadrant pain: Secondary | ICD-10-CM

## 2012-09-14 DIAGNOSIS — R319 Hematuria, unspecified: Secondary | ICD-10-CM

## 2012-09-14 MED ORDER — IOHEXOL 300 MG/ML  SOLN
100.0000 mL | Freq: Once | INTRAMUSCULAR | Status: AC | PRN
  Administered 2012-09-14: 100 mL via INTRAVENOUS

## 2012-09-21 ENCOUNTER — Other Ambulatory Visit: Payer: Self-pay | Admitting: Urology

## 2012-09-21 ENCOUNTER — Encounter (HOSPITAL_COMMUNITY): Payer: Self-pay | Admitting: *Deleted

## 2012-09-21 NOTE — Pre-Procedure Instructions (Signed)
Asked to bring blue folder the day of the procedure,insurance card,I.D. driver's license,wear comfortable clothing and have a driver for the day. Asked not to take Advil,Motrin,Ibuprofen,Aleve or any NSAIDS, Aspirin, or Toradol for 72 hours prior to procedure,  No vitamins or herbal medications 7 days prior to procedure. Instructed to take laxative per doctor's office instructions and eat a light dinner the evening before procedure.   To arrive at 1030 for lithotripsy procedure.  

## 2012-09-22 ENCOUNTER — Encounter (HOSPITAL_COMMUNITY): Payer: Self-pay | Admitting: Pharmacy Technician

## 2012-09-25 ENCOUNTER — Ambulatory Visit (HOSPITAL_COMMUNITY)
Admission: RE | Admit: 2012-09-25 | Discharge: 2012-09-25 | Disposition: A | Discharge status: Home / Self Care | Payer: Federal, State, Local not specified - PPO | Source: Ambulatory Visit | Attending: Urology | Admitting: Urology

## 2012-09-25 ENCOUNTER — Encounter (HOSPITAL_COMMUNITY)
Admission: RE | Disposition: A | Discharge status: Home / Self Care | Payer: Self-pay | Source: Ambulatory Visit | Attending: Urology

## 2012-09-25 ENCOUNTER — Ambulatory Visit (HOSPITAL_COMMUNITY): Payer: Federal, State, Local not specified - PPO

## 2012-09-25 ENCOUNTER — Encounter (HOSPITAL_COMMUNITY): Payer: Self-pay | Admitting: *Deleted

## 2012-09-25 DIAGNOSIS — N200 Calculus of kidney: Secondary | ICD-10-CM | POA: Insufficient documentation

## 2012-09-25 DIAGNOSIS — I10 Essential (primary) hypertension: Secondary | ICD-10-CM | POA: Insufficient documentation

## 2012-09-25 DIAGNOSIS — R82998 Other abnormal findings in urine: Secondary | ICD-10-CM | POA: Insufficient documentation

## 2012-09-25 SURGERY — LITHOTRIPSY, ESWL
Anesthesia: LOCAL | Laterality: Left

## 2012-09-25 MED ORDER — CIPROFLOXACIN HCL 500 MG PO TABS
500.0000 mg | ORAL_TABLET | ORAL | Status: AC
  Administered 2012-09-25: 500 mg via ORAL
  Filled 2012-09-25: qty 1

## 2012-09-25 MED ORDER — CIPROFLOXACIN HCL 250 MG PO TABS: 250.0000 mg | ORAL_TABLET | Freq: Two times a day (BID) | ORAL | Status: DC

## 2012-09-25 MED ORDER — OXYCODONE-ACETAMINOPHEN 5-325 MG PO TABS: 1.0000 | ORAL_TABLET | ORAL | Status: DC | PRN

## 2012-09-25 MED ORDER — DIPHENHYDRAMINE HCL 25 MG PO CAPS
25.0000 mg | ORAL_CAPSULE | ORAL | Status: AC
  Administered 2012-09-25: 25 mg via ORAL
  Filled 2012-09-25: qty 1

## 2012-09-25 MED ORDER — DIAZEPAM 5 MG PO TABS
10.0000 mg | ORAL_TABLET | ORAL | Status: AC
  Administered 2012-09-25: 10 mg via ORAL
  Filled 2012-09-25: qty 2

## 2012-09-25 MED ORDER — SODIUM CHLORIDE 0.9 % IV SOLN
INTRAVENOUS | Status: DC
  Administered 2012-09-25: 11:00:00 via INTRAVENOUS

## 2012-09-25 NOTE — H&P (Signed)
History of Present Illness           This is a new patient seen in consultation for nephrolithiasis referred by Dr. Ihor Dow March 2013. For more than a week the patient has had some left lower quadrant pain. She was treated with Flagyl and Cipro for presumed diverticulitis. She continued to have pain and some mild left flank pain. She also had hematuria. A CT scan of the abdomen and pelvis was done September 15, 2011 which revealed a 6 mm stone in the left renal pelvis with no obstruction. The stone was visible on the scout image. The There were no other stones. The mucosa of the left renal pelvis looks slightly prominent possible inflammation but the collecting system and renal pelvis was normal on delayed imaging.  Today, she has not passed a stone. She is having no dysuria frequency or urgency today.  This is her first stone episode. She has had no Urologic surgery.    Past Medical History Problems  1. History of  Arthritis V13.4 2. History of  Hypertension 401.9  Surgical History Problems  1. History of  Excision Of Ischial Bursa 2. History of  Hysterectomy V45.77  Current Meds 1. Hydrochlorothiazide 25 MG Oral Tablet; Therapy: (Recorded:19Mar2014) to 2. Vivelle-Dot 0.075 MG/24HR Transdermal Patch Biweekly; Therapy: (Recorded:19Mar2014) to  Allergies Medication  1. No Known Drug Allergies  Family History Problems  1. Maternal history of  Bladder Cancer V16.52 2. Family history of  Death In The Family Father 3. Family history of  Death In The Family Mother 4. Family history of  Family Health Status Children ___ Living Sons 2 5. Family history of  Heart Disease V17.49 6. Family history of  Nephrolithiasis 7. Family history of  Prostate Cancer V16.42  Social History Problems    Alcohol Use 1 glass of wine   Caffeine Use 1 glass of tea   Marital History - Currently Married   Never A Smoker   Retired From Work  Review of Systems Genitourinary, constitutional, skin,  eye, otolaryngeal, hematologic/lymphatic, cardiovascular, pulmonary, endocrine, musculoskeletal, gastrointestinal, neurological and psychiatric system(s) were reviewed and pertinent findings if present are noted.  Gastrointestinal: nausea and diarrhea.  Constitutional: feeling tired (fatigue).  Musculoskeletal: back pain.    Vitals Vital Signs [Data Includes: Last 1 Day]  19Mar2014 01:33PM  BMI Calculated: 25.41 BSA Calculated: 1.83 Height: 5 ft 6.5 in Weight: 160 lb  Blood Pressure: 151 / 94 Temperature: 97.4 F Heart Rate: 89  Physical Exam Constitutional: Well nourished and well developed . No acute distress.  ENT:. The ears and nose are normal in appearance.  Neck: The appearance of the neck is normal and no neck mass is present.  Pulmonary: No respiratory distress and normal respiratory rhythm and effort.  Cardiovascular: Heart rate and rhythm are normal . No peripheral edema.  Skin: Normal skin turgor, no visible rash and no visible skin lesions.  Neuro/Psych:. Mood and affect are appropriate.    Results/Data Urine [Data Includes: Last 1 Day]   19Mar2014  COLOR YELLOW   APPEARANCE CLOUDY   SPECIFIC GRAVITY 1.010   pH 6.5   GLUCOSE NEG mg/dL  BILIRUBIN NEG   KETONE NEG mg/dL  BLOOD TRACE   PROTEIN TRACE mg/dL  UROBILINOGEN 0.2 mg/dL  NITRITE NEG   LEUKOCYTE ESTERASE NEG   SQUAMOUS EPITHELIAL/HPF RARE   WBC 3-6 WBC/hpf  RBC 3-6 RBC/hpf  BACTERIA MODERATE   CRYSTALS NONE SEEN   CASTS NONE SEEN   Other MUCUS  Assessment Assessed  1. Asymptomatic Bacteruria 791.9 2. Nephrolithiasis 592.0  Plan Health Maintenance (V70.0)  1. UA With REFLEX  Done: 19Mar2014 01:23PM Nephrolithiasis (592.0)  2. Follow-up Schedule Surgery Office  Follow-up  Requested for: 19Mar2014  Discussion/Summary     We discussed the CT findings and I drew her a picture of the anatomy. We went over the understanding kidney stones booklet and the nature risks and benefits of continued  surveillance, endoscopic management plus or minus a ureteral stent, shockwave lithotripsy. All questions answered she would like to proceed with shockwave lithotripsy. We discussed risk of kidney bleeding, infection and failure to fragment the stone among others. We discussed on occasion the stone cannot be located the day of surgery.     Signatures Electronically signed by : Jerilee Field, M.D.; Sep 20 2012  2:23PM

## 2012-09-25 NOTE — Interval H&P Note (Signed)
History and Physical Interval Note:  09/25/2012 12:54 PM  Sheila Allen  has presented today for surgery, with the diagnosis of LEFT NEPHROLITHIASIS  The various methods of treatment have been discussed with the patient and family. After consideration of risks, benefits and other options for treatment, the patient has consented to  Procedure(s): EXTRACORPOREAL SHOCK WAVE LITHOTRIPSY (ESWL) (Left) as a surgical intervention .  The patient's history has been reviewed, patient examined, no change in status, stable for surgery.  I have reviewed the patient's chart and labs.  Questions were answered to the patient's satisfaction.  Stone lines up with a TP on KUB, but we can see it well on scout flouro on the truck moving with resp. Motion in the expected location of proximal ureter renal pelvis.    Antony Haste

## 2012-09-25 NOTE — Brief Op Note (Signed)
09/25/2012  1:20 PM  PATIENT:  Sheila Allen  57 y.o. female  PRE-OPERATIVE DIAGNOSIS:  LEFT NEPHROLITHIASIS  POST-OPERATIVE DIAGNOSIS: same   PROCEDURE:  Procedure(s): EXTRACORPOREAL SHOCK WAVE LITHOTRIPSY (ESWL) (Left)  SURGEON:  Surgeon(s) and Role:    * Antony Haste, MD - Primary   PLAN OF CARE: Discharge to home after PACU  PATIENT DISPOSITION:  PACU - hemodynamically stable.   Delay start of Pharmacological VTE agent (>24hrs) due to surgical blood loss or risk of bleeding: yes

## 2013-06-13 ENCOUNTER — Other Ambulatory Visit: Payer: Self-pay | Admitting: Obstetrics & Gynecology

## 2013-06-13 DIAGNOSIS — R928 Other abnormal and inconclusive findings on diagnostic imaging of breast: Secondary | ICD-10-CM

## 2013-06-19 ENCOUNTER — Ambulatory Visit
Admission: RE | Admit: 2013-06-19 | Discharge: 2013-06-19 | Disposition: A | Discharge status: Home / Self Care | Payer: Federal, State, Local not specified - PPO | Source: Ambulatory Visit | Attending: Obstetrics & Gynecology | Admitting: Obstetrics & Gynecology

## 2013-06-19 DIAGNOSIS — R928 Other abnormal and inconclusive findings on diagnostic imaging of breast: Secondary | ICD-10-CM

## 2013-07-07 ENCOUNTER — Encounter (HOSPITAL_COMMUNITY): Payer: Self-pay | Admitting: Emergency Medicine

## 2013-07-07 ENCOUNTER — Emergency Department (HOSPITAL_COMMUNITY): Payer: Federal, State, Local not specified - PPO

## 2013-07-07 ENCOUNTER — Observation Stay (HOSPITAL_COMMUNITY)
Admission: EM | Admit: 2013-07-07 | Discharge: 2013-07-07 | Disposition: A | Discharge status: Home / Self Care | Payer: Federal, State, Local not specified - PPO | Attending: Internal Medicine | Admitting: Internal Medicine

## 2013-07-07 DIAGNOSIS — N189 Chronic kidney disease, unspecified: Secondary | ICD-10-CM | POA: Insufficient documentation

## 2013-07-07 DIAGNOSIS — Z7982 Long term (current) use of aspirin: Secondary | ICD-10-CM | POA: Insufficient documentation

## 2013-07-07 DIAGNOSIS — I129 Hypertensive chronic kidney disease with stage 1 through stage 4 chronic kidney disease, or unspecified chronic kidney disease: Secondary | ICD-10-CM | POA: Insufficient documentation

## 2013-07-07 DIAGNOSIS — Z79899 Other long term (current) drug therapy: Secondary | ICD-10-CM | POA: Insufficient documentation

## 2013-07-07 DIAGNOSIS — R110 Nausea: Secondary | ICD-10-CM | POA: Insufficient documentation

## 2013-07-07 DIAGNOSIS — R079 Chest pain, unspecified: Principal | ICD-10-CM | POA: Diagnosis present

## 2013-07-07 DIAGNOSIS — K5732 Diverticulitis of large intestine without perforation or abscess without bleeding: Secondary | ICD-10-CM | POA: Insufficient documentation

## 2013-07-07 DIAGNOSIS — R0602 Shortness of breath: Secondary | ICD-10-CM | POA: Insufficient documentation

## 2013-07-07 DIAGNOSIS — M25559 Pain in unspecified hip: Secondary | ICD-10-CM | POA: Insufficient documentation

## 2013-07-07 DIAGNOSIS — I10 Essential (primary) hypertension: Secondary | ICD-10-CM | POA: Diagnosis present

## 2013-07-07 DIAGNOSIS — K5900 Constipation, unspecified: Secondary | ICD-10-CM | POA: Insufficient documentation

## 2013-07-07 DIAGNOSIS — R209 Unspecified disturbances of skin sensation: Secondary | ICD-10-CM | POA: Insufficient documentation

## 2013-07-07 DIAGNOSIS — R197 Diarrhea, unspecified: Secondary | ICD-10-CM | POA: Insufficient documentation

## 2013-07-07 DIAGNOSIS — M7072 Other bursitis of hip, left hip: Secondary | ICD-10-CM

## 2013-07-07 DIAGNOSIS — E876 Hypokalemia: Secondary | ICD-10-CM | POA: Diagnosis present

## 2013-07-07 DIAGNOSIS — R0789 Other chest pain: Secondary | ICD-10-CM | POA: Insufficient documentation

## 2013-07-07 HISTORY — DX: Calculus of kidney: N20.0

## 2013-07-07 LAB — TROPONIN I: Troponin I: <0.3 ng/mL (ref <0.30)

## 2013-07-07 LAB — LIPID PANEL
CHOL/HDL RATIO: 3.8 ratio
Cholesterol: 184 mg/dL (ref 0–200)
HDL: 49 mg/dL (ref >39)
LDL Cholesterol: 110 mg/dL — ABNORMAL HIGH (ref 0–99)
Triglycerides: 126 mg/dL (ref <150)
VLDL: 25 mg/dL (ref 0–40)

## 2013-07-07 LAB — CBC
HEMATOCRIT: 38.7 % (ref 36.0–46.0)
Hemoglobin: 12.8 g/dL (ref 12.0–15.0)
MCH: 29 pg (ref 26.0–34.0)
MCHC: 33.1 g/dL (ref 30.0–36.0)
MCV: 87.6 fL (ref 78.0–100.0)
Platelets: 366 10*3/uL (ref 150–400)
RBC: 4.42 MIL/uL (ref 3.87–5.11)
RDW: 13.1 % (ref 11.5–15.5)
WBC: 9.2 10*3/uL (ref 4.0–10.5)

## 2013-07-07 LAB — BASIC METABOLIC PANEL
BUN: 17 mg/dL (ref 6–23)
CHLORIDE: 99 meq/L (ref 96–112)
CO2: 30 mEq/L (ref 19–32)
Calcium: 9.1 mg/dL (ref 8.4–10.5)
Creatinine, Ser: 0.79 mg/dL (ref 0.50–1.10)
GFR calc Af Amer: >90 mL/min (ref >90)
GLUCOSE: 121 mg/dL — AB (ref 70–99)
Potassium: 3.1 mEq/L — ABNORMAL LOW (ref 3.7–5.3)
Sodium: 140 mEq/L (ref 137–147)

## 2013-07-07 LAB — CREATININE, SERUM
Creatinine, Ser: 0.65 mg/dL (ref 0.50–1.10)
GFR calc Af Amer: >90 mL/min (ref >90)
GFR calc non Af Amer: >90 mL/min (ref >90)

## 2013-07-07 LAB — PRO B NATRIURETIC PEPTIDE: Pro B Natriuretic peptide (BNP): 8.8 pg/mL (ref 0–125)

## 2013-07-07 LAB — POCT I-STAT TROPONIN I: Troponin i, poc: 0 ng/mL (ref 0.00–0.08)

## 2013-07-07 MED ORDER — METOPROLOL TARTRATE 12.5 MG HALF TABLET: 12.5000 mg | ORAL_TABLET | Freq: Two times a day (BID) | ORAL | Status: DC

## 2013-07-07 MED ORDER — ATORVASTATIN CALCIUM 20 MG PO TABS
20.0000 mg | ORAL_TABLET | Freq: Every day | ORAL | Status: DC
  Filled 2013-07-07: qty 1

## 2013-07-07 MED ORDER — ENOXAPARIN SODIUM 40 MG/0.4ML ~~LOC~~ SOLN
40.0000 mg | SUBCUTANEOUS | Status: DC
  Administered 2013-07-07: 40 mg via SUBCUTANEOUS
  Filled 2013-07-07 (×2): qty 0.4

## 2013-07-07 MED ORDER — PANTOPRAZOLE SODIUM 40 MG PO TBEC
40.0000 mg | DELAYED_RELEASE_TABLET | Freq: Every day | ORAL | Status: DC
  Administered 2013-07-07: 40 mg via ORAL
  Filled 2013-07-07: qty 1

## 2013-07-07 MED ORDER — ACETAMINOPHEN 650 MG RE SUPP: 650.0000 mg | Freq: Four times a day (QID) | RECTAL | Status: DC | PRN

## 2013-07-07 MED ORDER — ASPIRIN 81 MG PO TBEC: 81.0000 mg | DELAYED_RELEASE_TABLET | Freq: Every day | ORAL | Status: DC

## 2013-07-07 MED ORDER — SODIUM CHLORIDE 0.9 % IJ SOLN: 3.0000 mL | INTRAMUSCULAR | Status: DC | PRN

## 2013-07-07 MED ORDER — ASPIRIN 81 MG PO CHEW
324.0000 mg | CHEWABLE_TABLET | Freq: Once | ORAL | Status: AC
  Administered 2013-07-07: 324 mg via ORAL
  Filled 2013-07-07: qty 4

## 2013-07-07 MED ORDER — ONDANSETRON HCL 4 MG/2ML IJ SOLN
4.0000 mg | Freq: Four times a day (QID) | INTRAMUSCULAR | Status: DC | PRN
Start: 2013-07-07 — End: 2013-07-07

## 2013-07-07 MED ORDER — SODIUM CHLORIDE 0.9 % IJ SOLN
3.0000 mL | Freq: Two times a day (BID) | INTRAMUSCULAR | Status: DC
  Administered 2013-07-07: 3 mL via INTRAVENOUS

## 2013-07-07 MED ORDER — POTASSIUM CHLORIDE CRYS ER 20 MEQ PO TBCR
40.0000 meq | EXTENDED_RELEASE_TABLET | Freq: Once | ORAL | Status: AC
  Administered 2013-07-07: 40 meq via ORAL
  Filled 2013-07-07: qty 2

## 2013-07-07 MED ORDER — SODIUM CHLORIDE 0.9 % IJ SOLN: 3.0000 mL | Freq: Two times a day (BID) | INTRAMUSCULAR | Status: DC

## 2013-07-07 MED ORDER — HYDROCODONE-ACETAMINOPHEN 5-325 MG PO TABS: 1.0000 | ORAL_TABLET | ORAL | Status: DC | PRN

## 2013-07-07 MED ORDER — SODIUM CHLORIDE 0.9 % IV SOLN: 250.0000 mL | INTRAVENOUS | Status: DC | PRN

## 2013-07-07 MED ORDER — ATORVASTATIN CALCIUM 20 MG PO TABS: 20.0000 mg | ORAL_TABLET | Freq: Every day | ORAL | Status: DC

## 2013-07-07 MED ORDER — POTASSIUM CHLORIDE 10 MEQ/100ML IV SOLN
10.0000 meq | INTRAVENOUS | Status: AC
Start: 2013-07-07 — End: 2013-07-07
  Administered 2013-07-07 (×2): 10 meq via INTRAVENOUS
  Filled 2013-07-07 (×3): qty 100

## 2013-07-07 MED ORDER — ONDANSETRON HCL 4 MG PO TABS: 4.0000 mg | ORAL_TABLET | Freq: Four times a day (QID) | ORAL | Status: DC | PRN

## 2013-07-07 MED ORDER — MORPHINE SULFATE 2 MG/ML IJ SOLN: 2.0000 mg | INTRAMUSCULAR | Status: DC | PRN

## 2013-07-07 MED ORDER — ZOLPIDEM TARTRATE 5 MG PO TABS: 5.0000 mg | ORAL_TABLET | Freq: Every evening | ORAL | Status: DC | PRN

## 2013-07-07 MED ORDER — DOCUSATE SODIUM 100 MG PO CAPS
100.0000 mg | ORAL_CAPSULE | Freq: Two times a day (BID) | ORAL | Status: DC
  Administered 2013-07-07: 100 mg via ORAL
  Filled 2013-07-07 (×2): qty 1

## 2013-07-07 MED ORDER — NITROGLYCERIN 0.4 MG SL SUBL: 0.4000 mg | SUBLINGUAL_TABLET | SUBLINGUAL | Status: DC | PRN

## 2013-07-07 MED ORDER — METOPROLOL TARTRATE 12.5 MG HALF TABLET
12.5000 mg | ORAL_TABLET | Freq: Two times a day (BID) | ORAL | Status: DC
  Administered 2013-07-07: 12.5 mg via ORAL
  Filled 2013-07-07 (×2): qty 1

## 2013-07-07 MED ORDER — ZOLPIDEM TARTRATE 10 MG PO TABS: 10.0000 mg | ORAL_TABLET | Freq: Every evening | ORAL | Status: DC | PRN

## 2013-07-07 MED ORDER — ACETAMINOPHEN 325 MG PO TABS: 650.0000 mg | ORAL_TABLET | Freq: Four times a day (QID) | ORAL | Status: DC | PRN

## 2013-07-07 MED ORDER — ASPIRIN EC 81 MG PO TBEC
81.0000 mg | DELAYED_RELEASE_TABLET | Freq: Every day | ORAL | Status: DC
  Administered 2013-07-07: 81 mg via ORAL
  Filled 2013-07-07 (×2): qty 1

## 2013-07-07 NOTE — ED Provider Notes (Signed)
CSN: [id number]     Arrival date & time 07/07/13  4098 History   First MD Initiated Contact with Patient 07/07/13 0327     Chief Complaint  Patient presents with  . Chest Pain  . Shortness of Breath   (Consider location/radiation/quality/duration/timing/severity/associated sxs/prior Treatment) HPI Comments: Patient is a 58 year old female with a past medical history of hypertension who presents with chest pain that started around 2:00am. Patient reports laying in bed and having sudden onset of central chest pain that was severe and did not radiate. The pain is described at pressure. She reports associated SOB and left arm tingling. Symptoms lasted about 45 minutes before spontaneously resolving. Patient is not a smoker. She reports strong family history of heart disease and has seen Cardiologist Dr. Anne Fu in the past for follow up due to family history. Patient denies any other symptoms. She is currently pain free. She denies any previous episodes of these symptoms.   Patient is a 58 y.o. female presenting with chest pain and shortness of breath.  Chest Pain Associated symptoms: shortness of breath   Associated symptoms: no abdominal pain, no dizziness, no dysphagia, no fatigue, no fever, no nausea, no palpitations, not vomiting and no weakness   Shortness of Breath Associated symptoms: chest pain   Associated symptoms: no abdominal pain, no fever, no neck pain and no vomiting     Past Medical History  Diagnosis Date  . Hypertension   . Hip pain, left   . Chronic kidney disease     kidney stone   Past Surgical History  Procedure Laterality Date  . Abdominal hysterectomy    . Ovarian cyst removal     No family history on file. History  Substance Use Topics  . Smoking status: Never Smoker   . Smokeless tobacco: Never Used  . Alcohol Use: Yes     Comment: OCASIONAL   OB History   Grav Para Term Preterm Abortions TAB SAB Ect Mult Living                 Review of Systems   Constitutional: Negative for fever, chills and fatigue.  HENT: Negative for trouble swallowing.   Eyes: Negative for visual disturbance.  Respiratory: Positive for shortness of breath.   Cardiovascular: Positive for chest pain. Negative for palpitations.  Gastrointestinal: Negative for nausea, vomiting, abdominal pain and diarrhea.  Genitourinary: Negative for dysuria and difficulty urinating.  Musculoskeletal: Negative for arthralgias and neck pain.  Skin: Negative for color change.  Neurological: Negative for dizziness and weakness.  Psychiatric/Behavioral: Negative for dysphoric mood.    Allergies  Review of patient's allergies indicates no known allergies.  Home Medications   Current Outpatient Rx  Name  Route  Sig  Dispense  Refill  . estradiol (VIVELLE-DOT) 0.075 MG/24HR   Transdermal   Place 1 patch onto the skin 2 (two) times a week. Monday and Thursday           . hydrochlorothiazide (HYDRODIURIL) 25 MG tablet   Oral   Take 25 mg by mouth daily before breakfast.           . ibuprofen (ADVIL,MOTRIN) 200 MG tablet   Oral   Take 600 mg by mouth every 6 (six) hours as needed for moderate pain. Pain          . oxyCODONE-acetaminophen (PERCOCET/ROXICET) 5-325 MG per tablet   Oral   Take 1 tablet by mouth every 4 (four) hours as needed for moderate pain.  BP 162/85  Pulse 72  Temp(Src) 97.4 F (36.3 C) (Oral)  Resp 13  SpO2 99% Physical Exam  Nursing note and vitals reviewed. Constitutional: She is oriented to person, place, and time. She appears well-developed and well-nourished. No distress.  HENT:  Head: Normocephalic and atraumatic.  Eyes: Conjunctivae and EOM are normal.  Neck: Normal range of motion.  Cardiovascular: Normal rate and regular rhythm.  Exam reveals no gallop and no friction rub.   No murmur heard. Pulmonary/Chest: Effort normal and breath sounds normal. She has no wheezes. She has no rales. She exhibits no tenderness.   Abdominal: Soft. She exhibits no distension. There is no tenderness. There is no rebound and no guarding.  Musculoskeletal: Normal range of motion.  Neurological: She is alert and oriented to person, place, and time. Coordination normal.  Speech is goal-oriented. Moves limbs without ataxia.   Skin: Skin is warm and dry.  Psychiatric: She has a normal mood and affect. Her behavior is normal.    ED Course  Procedures (including critical care time) Labs Review Labs Reviewed  BASIC METABOLIC PANEL - Abnormal; Notable for the following:    Potassium 3.1 (*)    Glucose, Bld 121 (*)    All other components within normal limits  CBC  PRO B NATRIURETIC PEPTIDE  POCT I-STAT TROPONIN I   Imaging Review Dg Chest 2 View  07/07/2013   CLINICAL DATA:  Chest pain and shortness of breath  EXAM: CHEST  2 VIEW  COMPARISON:  07/15/2011  FINDINGS: The heart size and mediastinal contours are within normal limits. Both lungs are clear. The visualized skeletal structures are unremarkable.  IMPRESSION: No active cardiopulmonary disease.   Electronically Signed   By: Tiburcio PeaJonathan  Watts M.D.   On: 07/07/2013 04:28    EKG Interpretation    Date/Time:  Saturday July 07 2013 03:25:40 EST Ventricular Rate:  76 PR Interval:  174 QRS Duration: 89 QT Interval:  426 QTC Calculation: 479 R Axis:   48 Text Interpretation:  Sinus rhythm Baseline wander in lead(s) II No previous ECGs available Confirmed by CAMPOS  MD, KEVIN ([id number]) on 07/07/2013 5:13:41 AM            MDM   1. Chest pain     4:31 AM Patient given 324mg  ASA. Vitals stable and patient afebrile. Patient is pain free at this time. EKG and troponin negative. Patient has a strong family history of heart disease. I will consult hospitalist for admission for serial troponin and observation.   5:12 AM Dr. Adela Glimpseoutova will admit the patient.   Emilia BeckKaitlyn Mehul Rudin, [place] JerseyPA-C 07/07/13 [id number]

## 2013-07-07 NOTE — ED Notes (Signed)
Sudden onset of SOB and chest tightness onset 0200 at rest. SOB worst lying flat, Sat 98% RA, lungs are clear, pt did appear somewhat anxious. Chest tightness onset at the same time with tingling sensation on left arm. Denied nausea, dizziness, headache. NSR on monitor, S1 S2 heard. Pt alert, oriented, doesn't appear in distress.

## 2013-07-07 NOTE — ED Notes (Signed)
Attempted to call report to floor 

## 2013-07-07 NOTE — H&P (Signed)
PCP: [facility] Cardiology: Donato SchultzMark Skains  Chief Complaint:  Chest pain  HPI: Sheila AbbeRetta R Allen is a 58 y.o. female   has a past medical history of Hypertension; Hip pain, left; and Chronic kidney disease.   Presented with  Patient went to bed feeling well at 1:30 she developed sudden onset of chest pain, shortness of breath, left arm tingling and tightness in her chest. The shortness of breath would come and go. She have had a burning sensation in the center of her chest feels different from heart burn. The chest tightness now have resolved. She had some nausea associated with this. She never had this in the past. She have had a stress test done 2 years ago and was told she had no ischemia. She denies any hx of chest pain or tightness with activity. She did have hot flush when she was having this attack but no chills or fever. Denies any pleuritic chest pain. Currently chest pain free. CE neagtive so far, ECG non ischemic.Hospitalist called for admission.  Of note patient states she had a recent episode of diverticulitis he G. Hanson diarrhea intermittent followed by constipation. She is doing better from that standpoint. Her potassium was thought to be down to 3.1 in emerge department  Review of Systems:    Pertinent positives include:  chest pain, nausea, shortness of breath at rest.  Constitutional:  No weight loss, night sweats, Fevers, chills, fatigue, weight loss  HEENT:  No headaches, Difficulty swallowing,Tooth/dental problems,Sore throat,  No sneezing, itching, ear ache, nasal congestion, post nasal drip,  Cardio-vascular:  No Orthopnea, PND, anasarca, dizziness, palpitations.no Bilateral lower extremity swelling  GI:  No heartburn, indigestion, abdominal pain,  vomiting, diarrhea, change in bowel habits, loss of appetite, melena, blood in stool, hematemesis Resp:  no  No dyspnea on exertion, No excess mucus, no productive cough, No non-productive cough, No coughing up of blood.No change  in color of mucus.No wheezing. Skin:  no rash or lesions. No jaundice GU:  no dysuria, change in color of urine, no urgency or frequency. No straining to urinate.  No flank pain.  Musculoskeletal:  No joint pain or no joint swelling. No decreased range of motion. No back pain.  Psych:  No change in mood or affect. No depression or anxiety. No memory loss.  Neuro: no localizing neurological complaints, no tingling, no weakness, no double vision, no gait abnormality, no slurred speech, no confusion  Otherwise ROS are negative except for above, 10 systems were reviewed  Past Medical History: Past Medical History  Diagnosis Date  . Hypertension   . Hip pain, left   . Chronic kidney disease     kidney stone   Past Surgical History  Procedure Laterality Date  . Abdominal hysterectomy    . Ovarian cyst removal    . Hip surgery Left      Medications: Prior to Admission medications   Medication Sig Start Date End Date Taking? Authorizing Provider  estradiol (VIVELLE-DOT) 0.075 MG/24HR Place 1 patch onto the skin 2 (two) times a week. Monday and Thursday     Yes Historical Provider, MD  hydrochlorothiazide (HYDRODIURIL) 25 MG tablet Take 25 mg by mouth daily before breakfast.     Yes Historical Provider, MD  ibuprofen (ADVIL,MOTRIN) 200 MG tablet Take 600 mg by mouth every 6 (six) hours as needed for moderate pain. Pain    Yes Historical Provider, MD    Allergies:  No Known Allergies  Social History:  Ambulatory  independently  Lives at   Home with family   reports that she has never smoked. She has never used smokeless tobacco. She reports that she drinks alcohol. She reports that she does not use illicit drugs.   Family History: family history includes Bladder Cancer in her mother; Heart disease in her father and mother; Hypertension in her father and mother; Stroke in her father and mother.    Physical Exam: Patient Vitals for the past 24 hrs:  BP Temp Temp src Pulse  Resp SpO2  07/07/13 0326 162/85 mmHg 97.4 F (36.3 C) Oral 72 13 99 %    1. General:  in No Acute distress 2. Psychological: Alert and  Oriented 3. Head/ENT:   Moist  Mucous Membranes                          Head Non traumatic, neck supple                          Normal   Dentition 4. SKIN: normal Skin turgor,  Skin clean Dry and intact no rash 5. Heart: Regular rate and rhythm no Murmur, Rub or gallop 6. Lungs: Clear to auscultation bilaterally, no wheezes or crackles   7. Abdomen: Soft, non-tender, Non distended 8. Lower extremities: no clubbing, cyanosis, or edema 9. Neurologically Grossly intact, moving all 4 extremities equally 10. MSK: Normal range of motion  body mass index is unknown because there is no weight on file.   Labs on Admission:   Recent Labs  07/07/13 0337  NA 140  K 3.1*  CL 99  CO2 30  GLUCOSE 121*  BUN 17  CREATININE 0.79  CALCIUM 9.1   No results found for this basename: AST, ALT, ALKPHOS, BILITOT, PROT, ALBUMIN,  in the last 72 hours No results found for this basename: LIPASE, AMYLASE,  in the last 72 hours  Recent Labs  07/07/13 0337  WBC 9.2  HGB 12.8  HCT 38.7  MCV 87.6  PLT 366   No results found for this basename: CKTOTAL, CKMB, CKMBINDEX, TROPONINI,  in the last 72 hours No results found for this basename: TSH, T4TOTAL, FREET3, T3FREE, THYROIDAB,  in the last 72 hours No results found for this basename: VITAMINB12, FOLATE, FERRITIN, TIBC, IRON, RETICCTPCT,  in the last 72 hours No results found for this basename: HGBA1C    The CrCl is unknown because both a height and weight (above a minimum accepted value) are required for this calculation. ABG No results found for this basename: phart, pco2, po2, hco3, tco2, acidbasedef, o2sat     No results found for this basename: DDIMER     Other results:  I have pearsonaly reviewed this: ECG REPORT  Rate: 76  Rhythm: SR ST&T Change: no ischemic changes   Cultures: No results  found for this basename: sdes, specrequest, cult, reptstatus       Radiological Exams on Admission: Dg Chest 2 View  07/07/2013   CLINICAL DATA:  Chest pain and shortness of breath  EXAM: CHEST  2 VIEW  COMPARISON:  07/15/2011  FINDINGS: The heart size and mediastinal contours are within normal limits. Both lungs are clear. The visualized skeletal structures are unremarkable.  IMPRESSION: No active cardiopulmonary disease.   Electronically Signed   By: Tiburcio Pea M.D.   On: 07/07/2013 04:28    Chart has been reviewed  Assessment/Plan 58 year old female with history of hypertension and family history  of coronary artery disease presents with chest pain with onset while asleep  Present on Admission:  . Chest pain - somewhat worrisome features given associated shortness of breath and some nausea. So far cardiac enzymes are negative EKG is unremarkable. We'll admit for father observation to telemetry cycle cardiac enzymes obtain fasting lipid panel hemoglobin A1c. Make sure patient is on beta blocker and aspirin. Make n.p.o. in case she needs a procedure.  Patient had been seen by [facility] cardiology in the past . Hypokalemia - will replace and check magnesium  . Hypertension hold hydrochlorothiazide instead to switch to the metoprol   Prophylaxis:  Lovenox, Protonix  CODE STATUS: FULL CODE  Other plan as per orders.  I have spent a total of 55 min on this admission  Malea Swilling 07/07/2013, 5:44 AM

## 2013-07-07 NOTE — Discharge Summary (Signed)
Physician Discharge Summary  Sheila Allen VHQ:[id number] DOB: 06-Oct-1955 DOA: 07/07/2013  PCP: No primary provider on file.  Admit date: 07/07/2013 Discharge date: 07/07/2013  Recommendations for Outpatient Follow-up:  1. Follow up with cardiology within 1 week of discharge for further evaluation 2. Follow up with PCP as needed for diverticulitis. Repeat BMP to ensure potassium normal again.    Discharge Diagnoses:  Active Problems:   Chest pain   Hypokalemia   Hypertension   Chest pain at rest   Discharge Condition: stable, improved  Diet recommendation: healthy heart  Wt Readings from Last 3 Encounters:  07/07/13 73.891 kg (162 lb 14.4 oz)  07/20/11 77.16 kg (170 lb 1.7 oz)  07/20/11 77.16 kg (170 lb 1.7 oz)    History of present illness:  Sheila Allen is a 58 y.o. female  has a past medical history of Hypertension; Hip pain, left; and kidney stone Presented with  Patient went to bed feeling well at 1:30 she developed sudden onset of chest pain, shortness of breath, left arm tingling and tightness in her chest. The shortness of breath would come and go. She have had a burning sensation in the center of her chest feels different from heart burn. The chest tightness now have resolved. She had some nausea associated with this. She never had this in the past. She have had a stress test done 2 years ago and was told she had no ischemia. She denies any hx of chest pain or tightness with activity. She did have hot flush when she was having this attack but no chills or fever. Denies any pleuritic chest pain. Currently chest pain free. CE neagtive so far, ECG non ischemic.Hospitalist called for admission.  Of note patient states she had a recent episode of diverticulitis with diarrhea intermittent followed by constipation. She is doing better from that standpoint. Her potassium was thought to be down to 3.1 in [facility] department  Hospital Course:   Chest pain, unclear etiology:  Cardiac  risk score of 3 with moderately suspicious story, 1 risk factor (HTN), and age (31).  She had a negative stress test 2 years ago.  Her chest pain resolved and has not recurred.  Telemetry demonstrated NSR.  Troponins were negative x 3.  Her LDL is 110, HDL 49 and she was started on statin.  She was also started on ASA and beta blocker and given NTG prn pain.  Hemoglobin a1c is pending.  Patient is asking to go home because she feels better but she is advised that although the tests which were completed here for normal, that she may have had heart-related pain.  She should follow up with cardiology within 1 week for further evaluation.  ECHO has not been completed during this hospitalization.    HTN, HCTZ changed to metoprolol  Hypokalemia, treated with oral potassium.  Repeat BMP in 1 week to verify potassium has remained wnl.  Was likely low due to HCTZ which has been stopped.    Procedures:  CXR  Consultations:  None  Discharge Exam: Filed Vitals:   07/07/13 0847  BP: 138/73  Pulse: 65  Temp: 98 F (36.7 C)  Resp: 16   Filed Vitals:   07/07/13 0326 07/07/13 0711 07/07/13 0828 07/07/13 0847  BP: 162/85 145/78  138/73  Pulse: 72 71  65  Temp: 97.4 F (36.3 C) 97.4 F (36.3 C)  98 F (36.7 C)  TempSrc: Oral Oral  Oral  Resp: 13 12  16   Height:  5\' 6"  (1.676 m)  Weight:    73.891 kg (162 lb 14.4 oz)  SpO2: 99% 99% 97% 97%    General: CF, NAD HEENT:  NCAT, MMM Cardiovascular: RRR, no mrg, 2+ pulses Respiratory: CTAB, no increased WOB ABD:  NABS, soft, NT/ND MSK:  No LEE  Discharge Instructions      Discharge Orders   Future Orders Complete By Expires   Call MD for:  difficulty breathing, headache or visual disturbances  As directed    Call MD for:  extreme fatigue  As directed    Call MD for:  hives  As directed    Call MD for:  persistant dizziness or light-headedness  As directed    Call MD for:  persistant nausea and vomiting  As directed    Call MD for:   severe uncontrolled pain  As directed    Call MD for:  temperature >100.4  As directed    Diet - low sodium heart healthy  As directed    Discharge instructions  As directed    Comments:     You were hospitalized with chest pain.  You have not had any serious heart rhythm problems and your heart attack blood tests (troponins) have been repeatedly negative.  Your chest x-ray was normal.  Just because the tests completed today were normal does not exclude heart-related pain.  For this reason, please take the following medications:  Aspirin 81mg  daily, atorvastatin 20mg  in the evening, metoprolol 12.5mg  twice a day, and use nitroglycerin if you develop sudden chest tightness. Please make sure you follow up with Dr. Anne Fu in cardiology clinic next week.   Increase activity slowly  As directed        Medication List    STOP taking these medications       hydrochlorothiazide 25 MG tablet  Commonly known as:  HYDRODIURIL      TAKE these medications       aspirin 81 MG EC tablet  Take 1 tablet (81 mg total) by mouth daily.     atorvastatin 20 MG tablet  Commonly known as:  LIPITOR  Take 1 tablet (20 mg total) by mouth daily at 6 PM.  Start taking on:  07/08/2013     estradiol 0.075 MG/24HR  Commonly known as:  VIVELLE-DOT  - Place 1 patch onto the skin 2 (two) times a week. Monday and Thursday  -      ibuprofen 200 MG tablet  Commonly known as:  ADVIL,MOTRIN  - Take 600 mg by mouth every 6 (six) hours as needed for moderate pain. Pain  -      metoprolol tartrate 12.5 mg Tabs tablet  Commonly known as:  LOPRESSOR  Take 0.5 tablets (12.5 mg total) by mouth 2 (two) times daily.     nitroGLYCERIN 0.4 MG SL tablet  Commonly known as:  NITROSTAT  Place 1 tablet (0.4 mg total) under the tongue every 5 (five) minutes as needed for chest pain.     zolpidem 10 MG tablet  Commonly known as:  AMBIEN  Take 10 mg by mouth at bedtime as needed for sleep.       Follow-up Information    Follow up with Donato Schultz, MD. Schedule an appointment as soon as possible for a visit in 1 week.   Specialty:  Cardiology   Contact information:   [address] [place] [address] [phone]        The results of significant  diagnostics from this hospitalization (including imaging, microbiology, ancillary and laboratory) are listed below for reference.    Significant Diagnostic Studies: Dg Chest 2 View  07/07/2013   CLINICAL DATA:  Chest pain and shortness of breath  EXAM: CHEST  2 VIEW  COMPARISON:  07/15/2011  FINDINGS: The heart size and mediastinal contours are within normal limits. Both lungs are clear. The visualized skeletal structures are unremarkable.  IMPRESSION: No active cardiopulmonary disease.   Electronically Signed   By: Tiburcio PeaJonathan  Watts M.D.   On: 07/07/2013 04:28   [place] Breast Right  06/19/2013   CLINICAL DATA:  Screening recall for a right breast mass.  EXAM: DIGITAL DIAGNOSTIC  RIGHT MAMMOGRAM  ULTRASOUND RIGHT BREAST  COMPARISON:  Previous mammograms.  ACR Breast Density Category c: The breasts are heterogeneously dense, which may obscure small masses.  FINDINGS: There is a mass in the inner right breast with lobulated margins measuring approximately 8 mm, confirmed on the additional CC and MLO spot compression views.  Physical examination of the central and slightly inner right breast does not reveal any palpable abnormalities.  Targeted ultrasound of the right breast was performed demonstrating an oval hypoechoic mass at 3 o'clock 3 cm from the nipple measuring 0.5 x 0.2 x 0.5 cm, possibly a small cyst or intramammary lymph node. This may correspond with mammography findings. No suspicious masses or abnormalities are seen in the central or medial right breast. .  IMPRESSION: Probably benign right breast mass which may represent a small cyst or intramammary lymph node, and may correspond with mammography findings.  RECOMMENDATION: A six-month followup  right breast mammogram and ultrasound is recommended to demonstrate stability of the probably benign right breast mass.  I have discussed the findings and recommendations with the patient. Results were also provided in writing at the conclusion of the visit. If applicable, a reminder letter will be sent to the patient regarding the next appointment.  BI-RADS CATEGORY  3: Probably benign finding(s) - Nahum Sherrer interval follow-up suggested.   Electronically Signed   By: Edwin CapJennifer  Jarosz M.D.   On: 06/19/2013 14:50   Mm Digital Diagnostic Unilat R  06/19/2013   CLINICAL DATA:  Screening recall for a right breast mass.  EXAM: DIGITAL DIAGNOSTIC  RIGHT MAMMOGRAM  ULTRASOUND RIGHT BREAST  COMPARISON:  Previous mammograms.  ACR Breast Density Category c: The breasts are heterogeneously dense, which may obscure small masses.  FINDINGS: There is a mass in the inner right breast with lobulated margins measuring approximately 8 mm, confirmed on the additional CC and MLO spot compression views.  Physical examination of the central and slightly inner right breast does not reveal any palpable abnormalities.  Targeted ultrasound of the right breast was performed demonstrating an oval hypoechoic mass at 3 o'clock 3 cm from the nipple measuring 0.5 x 0.2 x 0.5 cm, possibly a small cyst or intramammary lymph node. This may correspond with mammography findings. No suspicious masses or abnormalities are seen in the central or medial right breast. .  IMPRESSION: Probably benign right breast mass which may represent a small cyst or intramammary lymph node, and may correspond with mammography findings.  RECOMMENDATION: A six-month followup right breast mammogram and ultrasound is recommended to demonstrate stability of the probably benign right breast mass.  I have discussed the findings and recommendations with the patient. Results were also provided in writing at the conclusion of the visit. If applicable, a reminder letter will be sent  to the patient regarding the next appointment.  BI-RADS CATEGORY  3: Probably benign finding(s) - Francella Barnett interval follow-up suggested.   Electronically Signed   By: Edwin Cap M.D.   On: 06/19/2013 14:50    Microbiology: No results found for this or any previous visit (from the past 240 hour(s)).   Labs: Basic Metabolic Panel:  Recent Labs Lab 07/07/13 0337 07/07/13 0941  NA 140  --   K 3.1*  --   CL 99  --   CO2 30  --   GLUCOSE 121*  --   BUN 17  --   CREATININE 0.79 0.65  CALCIUM 9.1  --    Liver Function Tests: No results found for this basename: AST, ALT, ALKPHOS, BILITOT, PROT, ALBUMIN,  in the last 168 hours No results found for this basename: LIPASE, AMYLASE,  in the last 168 hours No results found for this basename: AMMONIA,  in the last 168 hours CBC:  Recent Labs Lab 07/07/13 0337  WBC 9.2  HGB 12.8  HCT 38.7  MCV 87.6  PLT 366   Cardiac Enzymes:  Recent Labs Lab 07/07/13 0941 07/07/13 1441  TROPONINI <0.30 <0.30   BNP: BNP (last 3 results)  Recent Labs  07/07/13 0337  PROBNP 8.8   CBG: No results found for this basename: GLUCAP,  in the last 168 hours  Time coordinating discharge: 45 minutes  Signed:  Renae Fickle  [facility] 07/07/2013, 6:23 PM

## 2013-07-07 NOTE — Progress Notes (Signed)
Utilization Review completed.  

## 2013-07-07 NOTE — Progress Notes (Signed)
Pt discharged to home. DC instructions given. No concerns voiced. Pt encouraged to stop by pharmacy and pick up prescription. Voiced understanding. Left in good condition ambulating to checkout accompanied by spouse. Vwilliams,rn.

## 2013-07-07 NOTE — ED Provider Notes (Signed)
Medical screening examination/treatment/procedure(s) were performed by non-physician practitioner and as supervising physician I was immediately available for consultation/collaboration.  EKG Interpretation    Date/Time:  Saturday July 07 2013 03:25:40 EST Ventricular Rate:  76 PR Interval:  174 QRS Duration: 89 QT Interval:  426 QTC Calculation: 479 R Axis:   48 Text Interpretation:  Sinus rhythm Baseline wander in lead(s) II No previous ECGs available Confirmed by Tamsin Nader  MD, Cameshia Cressman ([id number]) on 07/07/2013 5:13:41 AM              Lyanne CoKevin M Darline Faith, MD 07/07/13 [phone]

## 2013-07-08 LAB — HEMOGLOBIN A1C
Hgb A1c MFr Bld: 5.6 % (ref <5.7)
Mean Plasma Glucose: 114 mg/dL (ref <117)

## 2013-07-09 NOTE — Progress Notes (Signed)
Discharge summary sent to payer through [facility]  

## 2013-07-10 ENCOUNTER — Encounter: Payer: Self-pay | Admitting: Cardiology

## 2013-07-10 ENCOUNTER — Ambulatory Visit (INDEPENDENT_AMBULATORY_CARE_PROVIDER_SITE_OTHER): Payer: Federal, State, Local not specified - PPO | Admitting: Cardiology

## 2013-07-10 VITALS — BP 128/76 | HR 70 | Ht 66.5 in | Wt 165.0 lb

## 2013-07-10 DIAGNOSIS — R0789 Other chest pain: Secondary | ICD-10-CM

## 2013-07-10 DIAGNOSIS — E785 Hyperlipidemia, unspecified: Secondary | ICD-10-CM

## 2013-07-10 DIAGNOSIS — I10 Essential (primary) hypertension: Secondary | ICD-10-CM

## 2013-07-10 DIAGNOSIS — E876 Hypokalemia: Secondary | ICD-10-CM

## 2013-07-10 DIAGNOSIS — R079 Chest pain, unspecified: Secondary | ICD-10-CM

## 2013-07-10 NOTE — Progress Notes (Signed)
[address] [place], [place]  [address] Phone: [phone] Fax:  [phone]  Date:  07/10/2013   ID:  Sheila AbbeRetta R Helbing, DOB 05-Sep-1955, MRN [id number]  PCP:  Gretel AcreNNODI, ADAKU, MD   History of Present Illness: Sheila Allen is a 58 y.o. female who was recently observed in the hospital with chest pain, discharge on 07/07/13 mean followup for diverticulitis by primary physician here for chest pain followup. She developed sudden onset of chest pain, shortness of breath, left arm tingling and tightness in her chest that was a burning sensation in the center but felt different from her heartburn. It had resolved. She did not feel this in the past. Heart flashes well. Cardiac biomarkers negative. EKG unremarkable. She's had recent episode of diverticulitis with diarrhea followed by intermittent constipation. Potassium slightly low at 3.1. Cardiac per square was 3. 1 respective hypertension and age of 58. Exercise treadmill test was low risk 2 years ago. LDL 110. Statin was started.  She's not had any further discomfort.  HCTZ was stopped, hypokalemia.    Wt Readings from Last 3 Encounters:  07/10/13 165 lb (74.844 kg)  07/07/13 162 lb 14.4 oz (73.891 kg)  07/20/11 170 lb 1.7 oz (77.16 kg)     Past Medical History  Diagnosis Date  . Hypertension   . Hip pain, left   . Kidney stone     Past Surgical History  Procedure Laterality Date  . Abdominal hysterectomy    . Ovarian cyst removal    . Hip surgery Left     Current Outpatient Prescriptions  Medication Sig Dispense Refill  . aspirin EC 81 MG EC tablet Take 1 tablet (81 mg total) by mouth daily.      Marland Kitchen. atorvastatin (LIPITOR) 20 MG tablet Take 1 tablet (20 mg total) by mouth daily at 6 PM.  30 tablet  0  . estradiol (VIVELLE-DOT) 0.075 MG/24HR Place 1 patch onto the skin 2 (two) times a week. Monday and Thursday        . ibuprofen (ADVIL,MOTRIN) 200 MG tablet Take 600 mg by mouth every 6 (six) hours as needed for  moderate pain. Pain       . metoprolol tartrate (LOPRESSOR) 12.5 mg TABS tablet Take 0.5 tablets (12.5 mg total) by mouth 2 (two) times daily.  60 tablet  0  . nitroGLYCERIN (NITROSTAT) 0.4 MG SL tablet Place 1 tablet (0.4 mg total) under the tongue every 5 (five) minutes as needed for chest pain.  30 tablet  0  . zolpidem (AMBIEN) 10 MG tablet Take 10 mg by mouth at bedtime as needed for sleep.       No current facility-administered medications for this visit.    Allergies:   No Known Allergies FHX: MI in 80's. Mother and Father.   Social History:  The patient  reports that she has never smoked. She has never used smokeless tobacco. She reports that she drinks alcohol. She reports that she does not use illicit drugs.   ROS:  Please see the history of present illness.   No syncope, no bleeding, no orthopnea.   PHYSICAL EXAM: VS:  BP 128/76  Pulse 70  Ht 5' 6.5" (1.689 m)  Wt 165 lb (74.844 kg)  BMI 26.24 kg/m2 Well nourished, well developed, in no acute distress HEENT: normal Neck: no JVD Cardiac:  normal S1, S2; RRR; no murmur Lungs:  clear to auscultation bilaterally, no wheezing, rhonchi or rales Abd:  soft, nontender, no hepatomegaly Ext: no edema Skin: warm and dry Neuro: no focal abnormalities noted  EKG:  Sinus rhythm, no ST segment changes 07/07/13  ASSESSMENT AND PLAN:  1. Atypical chest pain-no early family history of CAD, LDL was 110 not significantly elevated. We will go ahead and place her on treadmill test for further evaluation of potential ischemia. My suspicion is low. Continue with primary prevention strategies. Exercise. Aspirin is reasonable. 2. Hypertension-her hydrochlorothiazide was discontinued. Hypokalemia. 3. Hyperlipidemia-LDL greater than 100. It is not unreasonable to continue with this statin regimen for the time being. We will recheck lipid profile in 6 months. 4. Hypokalemia-hopefully this has resolved since discontinuation of hydrochlorothiazide.  This will be rechecked today by her primary physician Dr. Ihor Dow.  Signed, Donato Schultz, MD [facility]  07/10/2013 8:52 AM

## 2013-07-10 NOTE — Patient Instructions (Signed)
Your physician recommends that you continue on your current medications as directed. Please refer to the Current Medication list given to you today.  Your physician has requested that you have an exercise tolerance test. For further information please visit [email or web address]. Please also follow instruction sheet, as given.  Your physician recommends that you return for lab work in: 6 months , Lipid, ALT  Your physician wants you to follow-up in: 6 months follow-up with Dr. Anne FuSkains. You will receive a reminder letter in the mail two months in advance. If you don't receive a letter, please call our office to schedule the follow-up appointment.

## 2013-07-13 ENCOUNTER — Encounter (HOSPITAL_COMMUNITY): Payer: Federal, State, Local not specified - PPO

## 2013-07-19 ENCOUNTER — Telehealth: Payer: Self-pay | Admitting: Cardiology

## 2013-07-19 NOTE — Telephone Encounter (Signed)
New message   Micah FlesherWent to hospital for about 2 weeks ago for chest pain .   Had follow up with Dr. Anne FuSkains   Put on Lipitor by the hospital .   C/O tongue / face / arm   tingling.  Patient discontinue taken last night. Please advise.

## 2013-07-25 ENCOUNTER — Other Ambulatory Visit: Payer: Self-pay | Admitting: Cardiology

## 2013-07-25 MED ORDER — METOPROLOL TARTRATE 12.5 MG HALF TABLET: 12.5000 mg | ORAL_TABLET | Freq: Two times a day (BID) | ORAL | Status: DC

## 2013-07-25 NOTE — Telephone Encounter (Signed)
Please advised of alternative.

## 2013-07-25 NOTE — Telephone Encounter (Signed)
Dr. Anne FuSkains please recommend a alternative medications

## 2013-07-26 NOTE — Telephone Encounter (Signed)
Have her hold atorvastatin for 2 weeks. If symptoms go away restart atorvastatin at 10mg  PO QD (half prior dose) and take with Co-Q 10 100mg  PO QD suppliment.

## 2013-07-27 ENCOUNTER — Telehealth: Payer: Self-pay | Admitting: Cardiology

## 2013-07-27 MED ORDER — ATORVASTATIN CALCIUM 20 MG PO TABS: 10.0000 mg | ORAL_TABLET | Freq: Every day | ORAL | Status: DC

## 2013-07-27 NOTE — Telephone Encounter (Signed)
Spoke with patient advised to hold atorvastatin for 2 weeks. If symptoms go away restart atorvastatin at 10mg  PO QD (half prior dose) and take with Co-Q 10 100mg  PO QD suppliment.

## 2013-07-27 NOTE — Telephone Encounter (Signed)
Spoke with patient advised of medication changes. Patient verbalized understanding.Cut atorvastatin in half 20 mg once daily. Add Co-Q 10 100mg  once daily.

## 2013-08-07 ENCOUNTER — Other Ambulatory Visit (HOSPITAL_COMMUNITY): Payer: Self-pay | Admitting: Cardiology

## 2013-08-08 ENCOUNTER — Other Ambulatory Visit: Payer: Self-pay | Admitting: *Deleted

## 2013-08-08 MED ORDER — METOPROLOL TARTRATE 25 MG PO TABS: 12.5000 mg | ORAL_TABLET | Freq: Two times a day (BID) | ORAL | Status: DC

## 2013-08-14 ENCOUNTER — Ambulatory Visit (INDEPENDENT_AMBULATORY_CARE_PROVIDER_SITE_OTHER): Payer: Federal, State, Local not specified - PPO | Admitting: Cardiology

## 2013-08-14 DIAGNOSIS — R0789 Other chest pain: Secondary | ICD-10-CM

## 2013-08-14 NOTE — Progress Notes (Signed)
Exercise Treadmill Test  Pre-Exercise Testing Evaluation Rhythm: normal sinus  Rate: 66 bpm     Test  Exercise Tolerance Test Ordering MD: Donato SchultzMark Skains. MD  Interpreting MD: Donato SchultzMark Skains, MD  Unique Test No: 1  Treadmill:  1  Indication for ETT: chest pain - rule out ischemia  Contraindication to ETT: No   Stress Modality: exercise - treadmill  Cardiac Imaging Performed: non   Protocol: standard Bruce - maximal  Max BP:  197/87  Max MPHR (bpm):  162 85% MPR (bpm):  138  MPHR obtained (bpm):  164 % MPHR obtained:  101%  Reached 85% MPHR (min:sec):  6:30 Total Exercise Time (min-sec):  9 min  Workload in METS:  10.1 Borg Scale: 16  Reason ETT Terminated:  patient's desire to stop    ST Segment Analysis At Rest: normal ST segments - no evidence of significant ST depression With Exercise: no evidence of significant ST depression  Other Information Arrhythmia:  No Angina during ETT:  absent (0) Quality of ETT:  diagnostic  ETT Interpretation:  normal - no evidence of ischemia by ST analysis  Comments: Low risk study. Reassuring. Continue with aggressive risk factor modification.  Recommendations: She did state that she had some tingling on her tongue and her mouth after taking statin medication. She has been off of it for about a 2 weeks and feels better. I asked her to try once again at half the dose. She will let me know.  Donato SchultzSKAINS, MARK, MD

## 2013-10-01 ENCOUNTER — Other Ambulatory Visit: Payer: Self-pay | Admitting: Obstetrics & Gynecology

## 2013-10-01 DIAGNOSIS — N6310 Unspecified lump in the right breast, unspecified quadrant: Secondary | ICD-10-CM

## 2013-10-17 ENCOUNTER — Ambulatory Visit (INDEPENDENT_AMBULATORY_CARE_PROVIDER_SITE_OTHER): Payer: Federal, State, Local not specified - PPO | Admitting: Cardiology

## 2013-10-17 ENCOUNTER — Encounter: Payer: Self-pay | Admitting: Cardiology

## 2013-10-17 VITALS — BP 143/84 | HR 92 | Ht 63.0 in | Wt 163.1 lb

## 2013-10-17 DIAGNOSIS — R079 Chest pain, unspecified: Secondary | ICD-10-CM

## 2013-10-17 DIAGNOSIS — R202 Paresthesia of skin: Secondary | ICD-10-CM

## 2013-10-17 DIAGNOSIS — E7800 Pure hypercholesterolemia, unspecified: Secondary | ICD-10-CM

## 2013-10-17 DIAGNOSIS — R209 Unspecified disturbances of skin sensation: Secondary | ICD-10-CM

## 2013-10-17 MED ORDER — METOPROLOL TARTRATE 25 MG PO TABS: 25.0000 mg | ORAL_TABLET | Freq: Two times a day (BID) | ORAL | Status: DC

## 2013-10-17 NOTE — Patient Instructions (Addendum)
Your physician has recommended you make the following change in your medication:  1) STOP Atorvastatin 2) STOP CoQ10   Your physician wants you to follow-up in: 3 months You will receive a reminder letter in the mail two months in advance. If you don't receive a letter, please call our office to schedule the follow-up appointment.

## 2013-10-17 NOTE — Progress Notes (Signed)
[address] [place], [place]  [address] Phone: [phone] Fax:  [phone]  Date:  10/17/2013   ID:  Sheila AbbeRetta R Velie, DOB [birth year], MRN [id number]  PCP:  Gretel AcreNNODI, ADAKU, MD   History of Present Illness: Sheila Allen is a 58 y.o. female who was recently observed in the hospital with chest pain, discharge on 07/07/13 here for chest pain followup.   When taking whole pill of atorvastatin after taking 30 min later, face tingles. Took half and felt better. Had recent episode without medication. She developed sudden onset of chest pain, shortness of breath, left arm tingling and tightness in her chest that was a burning sensation in the center but felt different from her heartburn. It had resolved. She did not feel this in the past. Heart flashes well. Cardiac biomarkers negative. EKG unremarkable. She's had recent episode of diverticulitis with diarrhea followed by intermittent constipation. Potassium slightly low at 3.1. Cardiac per square was 3. 1 respective hypertension and age of 58. Exercise treadmill test was low risk.  She's not had any further discomfort.  HCTZ was stopped, hypokalemia.     Wt Readings from Last 3 Encounters:  10/17/13 163 lb 1.9 oz (73.991 kg)  07/10/13 165 lb (74.844 kg)  07/07/13 162 lb 14.4 oz (73.891 kg)     Past Medical History  Diagnosis Date  . Hypertension   . Hip pain, left   . Kidney stone     Past Surgical History  Procedure Laterality Date  . Abdominal hysterectomy    . Ovarian cyst removal    . Hip surgery Left     Current Outpatient Prescriptions  Medication Sig Dispense Refill  . ALPRAZolam (XANAX) 0.25 MG tablet Take 0.25 mg by mouth at bedtime as needed for anxiety.      Marland Kitchen. aspirin EC 81 MG EC tablet Take 1 tablet (81 mg total) by mouth daily.      Marland Kitchen. atorvastatin (LIPITOR) 20 MG tablet Take 0.5 tablets (10 mg total) by mouth daily at 6 PM.  30 tablet  0  . Coenzyme Q10 (CO Q 10) 100 MG CAPS Take 1 capsule by  mouth daily.      Marland Kitchen. estradiol (VIVELLE-DOT) 0.075 MG/24HR Place 1 patch onto the skin 2 (two) times a week. Monday and Thursday        . ibuprofen (ADVIL,MOTRIN) 200 MG tablet Take 600 mg by mouth every 6 (six) hours as needed for moderate pain. Pain       . metoprolol tartrate (LOPRESSOR) 12.5 mg TABS tablet Take 0.5 tablets (12.5 mg total) by mouth 2 (two) times daily.  60 tablet  5  . metoprolol tartrate (LOPRESSOR) 25 MG tablet Take 12.5 mg by mouth 2 (two) times daily.      . nitroGLYCERIN (NITROSTAT) 0.4 MG SL tablet Place 1 tablet (0.4 mg total) under the tongue every 5 (five) minutes as needed for chest pain.  30 tablet  0  . zolpidem (AMBIEN) 10 MG tablet Take 10 mg by mouth at bedtime as needed for sleep.       No current facility-administered medications for this visit.    Allergies:   No Known Allergies FHX: MI in 80's. Mother and Father.   Social History:  The patient  reports that she has never smoked. She has never used smokeless tobacco. She reports that she drinks alcohol. She reports that she does not use illicit drugs.   ROS:  Please see the history of present illness.   No syncope, no bleeding, no orthopnea.   PHYSICAL EXAM: VS:  BP 143/84  Pulse 92  Ht 5\' 3"  (1.6 m)  Wt 163 lb 1.9 oz (73.991 kg)  BMI 28.90 kg/m2 Well nourished, well developed, in no acute distress HEENT: normal Neck: no JVD Cardiac:  normal S1, S2; RRR; no murmur Lungs:  clear to auscultation bilaterally, no wheezing, rhonchi or rales Abd: soft, nontender, no hepatomegaly Ext: no edema Skin: warm and dry Neuro: no focal abnormalities noted, CN's intact  EKG:  Sinus rhythm, no ST segment changes 07/07/13 ETT: 08/14/13 - low risk, no evidence of ischemia.  ASSESSMENT AND PLAN:  1. Atypical chest pain-reassuring ETT. No early family history of CAD, LDL was 110 not significantly elevated.  My suspicion is low. Continue with primary prevention strategies. Exercise.  2. Hypertension-her  hydrochlorothiazide was discontinued. Hypokalemia. 3.   Hyperlipidemia-We tried atorvastatin however she had episodes of facial paresthesias with occasional arm paresthesia as well that she says occurred 30 minutes after taking full dose atorvastatin. Improved with half dose. Once occurred without statin intake. Worried her. My recommendation is so stop atorvastatin and Co-Q 10. This seems like a strange side effect of the medication. No focal weakness or changes in speech/balance during occurrence. If these episodes occur again, she would likely benefit from neurological evaluation. She has had issues with anxiety in the past and hyperventilation can result in paresthesia.   Will see her back in 3 months for update.   Signed, Donato SchultzMark Dorell Gatlin, MD [facility]  10/17/2013 4:53 PM

## 2013-10-24 ENCOUNTER — Telehealth: Payer: Self-pay | Admitting: Cardiology

## 2013-10-24 NOTE — Telephone Encounter (Signed)
New Message  Pt called states that she seen Dr. Anne FuSkains for numbness recently. She states that her numbness has gotten better, however she is still experiencing tingling and numbness in her left hand and left side of her tongue and lips continually. Pt requests a call back to discuss seeing neurologist. Please assist.

## 2013-10-24 NOTE — Telephone Encounter (Signed)
Spoke with her PCP, [facility]. She will be giving her a call and setting up neurology referral. Thanks.

## 2013-10-24 NOTE — Telephone Encounter (Signed)
Patient continues to experience the numbness to left hand, left side of tongue and lips. She is requesting a neurology referral from Dr. Anne FuSkains. Advised her that I would request this of Dr. Anne FuSkains and we would contact her with the name/information. She verbalized understanding and agreement.

## 2013-10-31 ENCOUNTER — Other Ambulatory Visit: Payer: Self-pay | Admitting: Family Medicine

## 2013-10-31 DIAGNOSIS — IMO0002 Reserved for concepts with insufficient information to code with codable children: Secondary | ICD-10-CM

## 2013-11-05 ENCOUNTER — Ambulatory Visit
Admission: RE | Admit: 2013-11-05 | Discharge: 2013-11-05 | Disposition: A | Discharge status: Home / Self Care | Payer: Federal, State, Local not specified - PPO | Source: Ambulatory Visit | Attending: Family Medicine | Admitting: Family Medicine

## 2013-11-05 DIAGNOSIS — IMO0002 Reserved for concepts with insufficient information to code with codable children: Secondary | ICD-10-CM

## 2013-11-07 ENCOUNTER — Ambulatory Visit (INDEPENDENT_AMBULATORY_CARE_PROVIDER_SITE_OTHER): Payer: Federal, State, Local not specified - PPO | Admitting: Neurology

## 2013-11-07 ENCOUNTER — Telehealth: Payer: Self-pay | Admitting: Neurology

## 2013-11-07 ENCOUNTER — Encounter: Payer: Self-pay | Admitting: Neurology

## 2013-11-07 ENCOUNTER — Ambulatory Visit (INDEPENDENT_AMBULATORY_CARE_PROVIDER_SITE_OTHER): Payer: Federal, State, Local not specified - PPO | Admitting: Radiology

## 2013-11-07 VITALS — BP 149/88 | HR 69 | Ht 66.0 in | Wt 162.0 lb

## 2013-11-07 DIAGNOSIS — R209 Unspecified disturbances of skin sensation: Secondary | ICD-10-CM

## 2013-11-07 HISTORY — DX: Unspecified disturbances of skin sensation: R20.9

## 2013-11-07 NOTE — Procedures (Signed)
    History:  [name] is a 58 year old patient with episodes of numbness beginning on the left side of the tongue, going to the left mouth, left face, then to the left hand and foot. The patient is being evaluated for possible sensory type seizure events.  This is a routine EEG. No skull defects are noted. Medications include alprazolam, aspirin, estradiol, ibuprofen, metoprolol, nitroglycerin, and Ambien  EEG classification: Normal awake  Description of the recording: The background rhythms of this recording consists of a fairly well modulated medium amplitude alpha rhythm of 10 Hz that is reactive to eye opening and closure. As the record progresses, the patient appears to remain in the waking state throughout the recording. Photic stimulation was performed, resulting in a bilateral and symmetric photic driving response. Hyperventilation was also performed, resulting in a minimal buildup of the background rhythm activities without significant slowing seen. At no time during the recording does there appear to be evidence of spike or spike wave discharges or evidence of focal slowing. EKG monitor shows no evidence of cardiac rhythm abnormalities with a heart rate of 66.  Impression: This is a normal EEG recording in the waking state. No evidence of ictal or interictal discharges are seen.

## 2013-11-07 NOTE — Telephone Encounter (Signed)
I called the paitent. EEG study was done, was normal. I discussed this with the patient.

## 2013-11-07 NOTE — Progress Notes (Signed)
Reason for visit: Sensation alteration  Sheila Allen is a 58 y.o. female  History of present illness:  Sheila Allen is a 58 year old right-handed white female with a history of hypertension. She indicates that beginning in January of 2015, she was placed on atorvastatin. She would note that within several minutes after taking her dose, she began having tingling and numbness of the lips on the left side, also including the tongue. She indicates that the episodes were occurring daily after dosing with her medication within 15 minutes. She was told to cut the tablet in half, and this seemed to help. Taking one half tablet, she has done fairly well until about 3 weeks ago. The patient began having similar events of left facial and tongue numbness and tingling, sometimes spreading to the face and into the left hand and a very occasionally into the left foot. The episodes would last up to 2 hours, and they would not be associated with medication dosing. The patient was told to go off of her cholesterol medications including her omega-3 oil, and she seems to have improved over the last several days. She's been set up for an evaluation that includes a carotid Doppler study and MRI evaluation of the brain that were unremarkable. The patient indicates that with the sensory events there is a gradual spread over 10 minutes that will begin with the tongue, go to the lips, sometimes to the face and hand, and very occasionally to the left foot. There is no associated weakness, clumsiness, slurred speech, visual changes, or confusion. Occasionally, she may get a mild left occipital headache with the events. She is on low-dose aspirin. She denies any balance problems or troubles controlling the bowels or the bladder. She does have low back pain. She is sent to this office for an evaluation.  Past Medical History  Diagnosis Date  . Hypertension   . Hip pain, left   . Kidney stone     lithotripsy  . Disturbance of  skin sensation 11/07/2013    Past Surgical History  Procedure Laterality Date  . Abdominal hysterectomy    . Ovarian cyst removal    . Hip surgery Left     bursectomy  . Tonsillectomy      Family History  Problem Relation Age of Onset  . Heart disease Mother   . Hypertension Mother   . Bladder Cancer Mother   . Stroke Mother   . Heart disease Father   . Hypertension Father   . Stroke Father   . Hypertension Sister   . Hypertension Son   . Hypertension Brother   . Migraines Neg Hx   . Seizures Neg Hx   . Hypertension Sister   . Hypertension Sister   . Hypertension Brother     Social history:  reports that she has never smoked. She has never used smokeless tobacco. She reports that she drinks alcohol. She reports that she does not use illicit drugs.  Medications:  Current Outpatient Prescriptions on File Prior to Visit  Medication Sig Dispense Refill  . ALPRAZolam (XANAX) 0.25 MG tablet Take 0.25 mg by mouth at bedtime as needed for anxiety.      Marland Kitchen aspirin EC 81 MG EC tablet Take 1 tablet (81 mg total) by mouth daily.      Marland Kitchen estradiol (VIVELLE-DOT) 0.075 MG/24HR Place 1 patch onto the skin 2 (two) times a week. Monday and Thursday        . ibuprofen (ADVIL,MOTRIN) 200 MG  tablet Take 600 mg by mouth every 6 (six) hours as needed for moderate pain. Pain       . metoprolol tartrate (LOPRESSOR) 12.5 mg TABS tablet Take 0.5 tablets (12.5 mg total) by mouth 2 (two) times daily.  60 tablet  5  . nitroGLYCERIN (NITROSTAT) 0.4 MG SL tablet Place 1 tablet (0.4 mg total) under the tongue every 5 (five) minutes as needed for chest pain.  30 tablet  0  . zolpidem (AMBIEN) 10 MG tablet Take 10 mg by mouth at bedtime as needed for sleep.       No current facility-administered medications on file prior to visit.     No Known Allergies  ROS:  Out of a complete 14 system review of symptoms, the patient complains only of the following symptoms, and all other reviewed systems are  negative.  Numbness Low back pain  Blood pressure 149/88, pulse 69, height 5\' 6"  (1.676 m), weight 162 lb (73.483 kg).  Physical Exam  General: The patient is alert and cooperative at the time of the examination.  Eyes: Pupils are equal, round, and reactive to light. Discs are flat bilaterally.  Neck: The neck is supple, no carotid bruits are noted.  Respiratory: The respiratory examination is clear.  Cardiovascular: The cardiovascular examination reveals a regular rate and rhythm, no obvious murmurs or rubs are noted.  Neuromuscular: Range of movement of the cervical spine is full.  Skin: Extremities are without significant edema.  Neurologic Exam  Mental status: The patient is alert and oriented x 3 at the time of the examination. The patient has apparent normal recent and remote memory, with an apparently normal attention span and concentration ability.  Cranial nerves: Facial symmetry is present. There is good sensation of the face to pinprick and soft touch bilaterally. The strength of the facial muscles and the muscles to head turning and shoulder shrug are normal bilaterally. Speech is well enunciated, no aphasia or dysarthria is noted. Extraocular movements are full. Visual fields are full. The tongue is midline, and the patient has symmetric elevation of the soft palate. No obvious hearing deficits are noted.  Motor: The motor testing reveals 5 over 5 strength of all 4 extremities. Good symmetric motor tone is noted throughout.  Sensory: Sensory testing is intact to pinprick, soft touch, vibration sensation, and position sense on all 4 extremities. No evidence of extinction is noted.  Coordination: Cerebellar testing reveals good finger-nose-finger and heel-to-shin bilaterally.  Gait and station: Gait is normal. Tandem gait is normal. Romberg is negative. No drift is seen.  Reflexes: Deep tendon reflexes are symmetric and normal bilaterally. Toes are downgoing  bilaterally.   MRI brain 11/05/2013:  IMPRESSION:  Negative MRI of the brain.   Carotid Doppler study results 11/05/2013:  IMPRESSION:  1. No significant plaque or stenosis.    Assessment/Plan:  1. Left-sided sensory alteration  This patient describes a sensory "Jacksonian march" beginning in the tongue, and then spreading to the lips, sometimes to the face, sometimes to the hand and foot on the left side. This is unlikely to be a medication side effect. The patient will occasionally have a mild headache associated with these events. TIA events are unlikely to be associated with a gradual spread, and are unlikely to occur on a daily basis. We will however, check MRA evaluation of the head. The patient will undergo an EEG study. Sensory seizures and migraine equivalent are within the differential diagnosis for these events. The patient will remain on  aspirin. She will followup through this office if needed. These sensory episodes are likely to be of benign origin.  Marlan Palau. Keith Cedar Ditullio MD 11/07/2013 9:23 AM  [facility] [address] [place], [place] [id number]  Phone [phone] Fax [phone]

## 2013-11-14 ENCOUNTER — Ambulatory Visit (INDEPENDENT_AMBULATORY_CARE_PROVIDER_SITE_OTHER): Payer: Federal, State, Local not specified - PPO

## 2013-11-14 DIAGNOSIS — R209 Unspecified disturbances of skin sensation: Secondary | ICD-10-CM

## 2013-11-15 ENCOUNTER — Telehealth: Payer: Self-pay | Admitting: Neurology

## 2013-11-15 NOTE — Telephone Encounter (Signed)
I called the patient. The MRA of the head was unremarkable. The patient indicates that she already got the results from the carotid Doppler and that was unremarkable. EEG study was normal. If the sensory events, frequent, medications may be indicated.

## 2013-12-04 ENCOUNTER — Telehealth: Payer: Self-pay | Admitting: *Deleted

## 2013-12-04 ENCOUNTER — Telehealth: Payer: Self-pay | Admitting: Cardiology

## 2013-12-04 NOTE — Telephone Encounter (Signed)
Next steps for numbness 1) Please discuss again with primary MD 2) If they feel it is appropriate, they can refer to neurology for further evaluation.  Thanks.  Donato Schultz, MD

## 2013-12-04 NOTE — Telephone Encounter (Signed)
BP at 12:15 pm is 156/90.

## 2013-12-04 NOTE — Telephone Encounter (Signed)
New Message:  Pt is c/o of numbness on her left side. States it is overall the left side of her body.. She states she has seen Dr. Anne Fu in the past for this same reason.. Pt wants to know if she can be worked in tomorrow to see Dr. Anne Fu. Pt is also requesting a call back from his nurse.

## 2013-12-04 NOTE — Telephone Encounter (Signed)
Patient states that Dr. Anne Fu has been trying to determine the cause of the patient's continued left side numbness (body/lips/tongue). She has been worked up and ruled out for CVA/stroke and Neurological causes, per vast testing, including MRI. Patient would like to speak with Dr. Anne Fu to discuss next steps.  Patient's BP this morning is 137/96. Nurse will call back after noon for BP recheck by patient.  Routed to Dr. Anne Fu.

## 2013-12-04 NOTE — Telephone Encounter (Signed)
Spoke to patient again at 2:28 pm. She states BP is continuing to decrease and is much improved over earlier reading today.

## 2013-12-04 NOTE — Telephone Encounter (Signed)
Patient has a call in to Dr. Luiz Ochoa [facility] to be worked in for an appointment as soon as available. She will keep [place] posted on what they find.

## 2013-12-05 NOTE — Telephone Encounter (Signed)
Spoke with patient and she would like an appt. Symptoms still continuing as of today

## 2013-12-05 NOTE — Telephone Encounter (Signed)
I called the patient. The MRI the brain was unremarkable, and the patient describes what appears to be a "jacksonian march" associated with her episodic sensory complaints. This is not consistent with demyelinating disease, but the possibility of sensory seizures or migraine-type events should be considered. We may consider an empiric trial on Topamax, and she is amenable to this, she is to contact our office.  The patient will be seen by NP tomorrow.

## 2013-12-06 ENCOUNTER — Encounter (INDEPENDENT_AMBULATORY_CARE_PROVIDER_SITE_OTHER): Payer: Self-pay

## 2013-12-06 ENCOUNTER — Ambulatory Visit (INDEPENDENT_AMBULATORY_CARE_PROVIDER_SITE_OTHER): Payer: Federal, State, Local not specified - PPO | Admitting: Adult Health

## 2013-12-06 ENCOUNTER — Encounter: Payer: Self-pay | Admitting: Adult Health

## 2013-12-06 VITALS — BP 152/80 | HR 71 | Temp 97.4°F | Ht 66.5 in | Wt 162.0 lb

## 2013-12-06 DIAGNOSIS — R209 Unspecified disturbances of skin sensation: Secondary | ICD-10-CM

## 2013-12-06 MED ORDER — TOPIRAMATE 25 MG PO TABS: ORAL_TABLET | ORAL | Status: DC

## 2013-12-06 NOTE — Patient Instructions (Addendum)
Take Topamax 1 tablet (25 mg) for 1 week, if tolerating then increase to 2 tablets daily at bedtime.     Seizure, Adult A seizure is abnormal electrical activity in the brain. Seizures usually last from 30 seconds to 2 minutes. There are various types of seizures. Before a seizure, you may have a warning sensation (aura) that a seizure is about to occur. An aura may include the following symptoms:   Fear or anxiety.  Nausea.  Feeling like the room is spinning (vertigo).  Vision changes, such as seeing flashing lights or spots. Common symptoms during a seizure include:  A change in attention or behavior (altered mental status).  Convulsions with rhythmic jerking movements.  Drooling.  Rapid eye movements.  Grunting.  Loss of bladder and bowel control.  Bitter taste in the mouth.  Tongue biting. After a seizure, you may feel confused and sleepy. You may also have an injury resulting from convulsions during the seizure. HOME CARE INSTRUCTIONS   If you are given medicines, take them exactly as prescribed by your health care provider.  Keep all follow-up appointments as directed by your health care provider.  Do not swim or drive or engage in risky activity during which a seizure could cause further injury to you or others until your health care provider says it is OK.  Get adequate rest.  Teach friends and family what to do if you have a seizure. They should:  Lay you on the ground to prevent a fall.  Put a cushion under your head.  Loosen any tight clothing around your neck.  Turn you on your side. If vomiting occurs, this helps keep your airway clear.  Stay with you until you recover.  Know whether or not you need emergency care. SEEK IMMEDIATE MEDICAL CARE IF:  The seizure lasts longer than 5 minutes.  The seizure is severe or you do not wake up immediately after the seizure.  You have an altered mental status after the seizure.  You are having more  frequent or worsening seizures. Someone should drive you to the emergency department or call local emergency services (911 in [place] MAKE SURE YOU:  Understand these instructions.  Will watch your condition.  Will get help right away if you are not doing well or get worse. Document Released: 06/18/2000 Document Revised: 04/11/2013 Document Reviewed: 01/31/2013 [facility] Patient Information 2014 [employer], [place]. Topiramate tablets What is this medicine? TOPIRAMATE (toe PYRE a mate) is used to treat seizures in adults or children with epilepsy. It is also used for the prevention of migraine headaches. This medicine may be used for other purposes; ask your health care provider or pharmacist if you have questions. COMMON BRAND NAME(S): Topamax, Topiragen  What should I tell my health care provider before I take this medicine? They need to know if you have any of these conditions: -cirrhosis of the liver or liver disease -diarrhea -glaucoma -kidney stones or kidney disease -lung disease like asthma, obstructive pulmonary disease, emphysema -metabolic acidosis -on a ketogenic diet -schedule for surgery or a procedure -suicidal thoughts, plans, or attempt; a previous suicide attempt by you or a family member -an unusual or allergic reaction to topiramate, other medicines, foods, dyes, or preservatives -pregnant or trying to get pregnant -breast-feeding How should I use this medicine? Take this medicine by mouth with a glass of water. Follow the directions on the prescription label. Do not crush or chew. You may take this medicine with meals. Take your medicine at regular  intervals. Do not take it more often than directed. Talk to your pediatrician regarding the use of this medicine in children. Special care may be needed. While this drug may be prescribed for children as young as 51 years of age for selected conditions, precautions do apply. Overdosage: If you think you have taken too much of  this medicine contact a poison control center or emergency room at once. NOTE: This medicine is only for you. Do not share this medicine with others. What if I miss a dose? If you miss a dose, take it as soon as you can. If your next dose is to be taken in less than 6 hours, then do not take the missed dose. Take the next dose at your regular time. Do not take double or extra doses. What may interact with this medicine? Do not take this medicine with any of the following medications: -probenecid This medicine may also interact with the following medications: -acetazolamide -alcohol -amitriptyline -birth control pills -digoxin -hydrochlorothiazide -lithium -medicines for pain, sleep, or muscle relaxation -metformin -methazolamide -other seizure or epilepsy medicines -pioglitazone -risperidone This list may not describe all possible interactions. Give your health care provider a list of all the medicines, herbs, non-prescription drugs, or dietary supplements you use. Also tell them if you smoke, drink alcohol, or use illegal drugs. Some items may interact with your medicine. What should I watch for while using this medicine? Visit your doctor or health care professional for regular checks on your progress. Do not stop taking this medicine suddenly. This increases the risk of seizures if you are using this medicine to control epilepsy. Wear a medical identification bracelet or chain to say you have epilepsy or seizures, and carry a card that lists all your medicines. This medicine can decrease sweating and increase your body temperature. Watch for signs of deceased sweating or fever, especially in children. Avoid extreme heat, hot baths, and saunas. Be careful about exercising, especially in hot weather. Contact your health care provider right away if you notice a fever or decrease in sweating. You should drink plenty of fluids while taking this medicine. If you have had kidney stones in the  past, this will help to reduce your chances of forming kidney stones. If you have stomach pain, with nausea or vomiting and yellowing of your eyes or skin, call your doctor immediately. You may get drowsy, dizzy, or have blurred vision. Do not drive, use machinery, or do anything that needs mental alertness until you know how this medicine affects you. To reduce dizziness, do not sit or stand up quickly, especially if you are an older patient. Alcohol can increase drowsiness and dizziness. Avoid alcoholic drinks. If you notice blurred vision, eye pain, or other eye problems, seek medical attention at once for an eye exam. The use of this medicine may increase the chance of suicidal thoughts or actions. Pay special attention to how you are responding while on this medicine. Any worsening of mood, or thoughts of suicide or dying should be reported to your health care professional right away. This medicine may increase the chance of developing metabolic acidosis. If left untreated, this can cause kidney stones, bone disease, or slowed growth in children. Symptoms include breathing fast, fatigue, loss of appetite, irregular heartbeat, or loss of consciousness. Call your doctor immediately if you experience any of these side effects. Also, tell your doctor about any surgery you plan on having while taking this medicine since this may increase your risk for metabolic  acidosis. Birth control pills may not work properly while you are taking this medicine. Talk to your doctor about using an extra method of birth control. Women who become pregnant while using this medicine may enroll in the [place] Antiepileptic Drug Pregnancy Registry by calling [phone]. This registry collects information about the safety of antiepileptic drug use during pregnancy. What side effects may I notice from receiving this medicine? Side effects that you should report to your doctor or health care professional as soon as  possible: -allergic reactions like skin rash, itching or hives, swelling of the face, lips, or tongue -decreased sweating and/or rise in body temperature -depression -difficulty breathing, fast or irregular breathing patterns -difficulty speaking -difficulty walking or controlling muscle movements -hearing impairment -redness, blistering, peeling or loosening of the skin, including inside the mouth -tingling, pain or numbness in the hands or feet -unusually weak or tired -worsening of mood, thoughts or actions of suicide or dying Side effects that usually do not require medical attention (report to your doctor or health care professional if they continue or are bothersome): -altered taste -back pain, joint or muscle aches and pains -diarrhea, or constipation -headache -loss of appetite -nausea -stomach upset, indigestion -tremors This list may not describe all possible side effects. Call your doctor for medical advice about side effects. You may report side effects to FDA at 1-800-FDA-1088. Where should I keep my medicine? Keep out of the reach of children. Store at room temperature between 15 and 30 degrees C (59 and 86 degrees F) in a tightly closed container. Protect from moisture. Throw away any unused medicine after the expiration date. NOTE: This sheet is a summary. It may not cover all possible information. If you have questions about this medicine, talk to your doctor, pharmacist, or health care provider.  2014, Elsevier/Gold Standard. (2012-10-05 14:39:51)

## 2013-12-06 NOTE — Progress Notes (Signed)
I have read the note, and I agree with the clinical assessment and plan.  Charles K Willis   

## 2013-12-06 NOTE — Progress Notes (Signed)
PATIENT: Sheila Allen DOB: 10/21/1955  REASON FOR VISIT: follow up HISTORY FROM: patient  HISTORY OF PRESENT ILLNESS: Sheila Allen is a 58 year old female with a history of sensory skin disturbance. The patient continues to have daily sensory changes that starts in her tongue usually but can start in other places.States that it normally is only on the left side. The numbness can be in her lip, the side of her face, the left arm, the left feet on the bottom and it usually spreads. She has started having numbness on the bottom of the right foot as well. No changes in her taste. Denies burning just tingling. Denies weakness and slurred speech. Work-up has been unremarkable up to this point. It is thought that these events could be sensory seizure or migraine. No new medical issues since the last visit.     REVIEW OF SYSTEMS: Full 14 system review of systems performed and notable only for:  Constitutional: N/A  Eyes: N/A Ear/Nose/Throat: N/A  Skin: N/A  Cardiovascular: N/A  Respiratory: N/A  Gastrointestinal: N/A  Genitourinary: N/A Hematology/Lymphatic: N/A  Endocrine: N/A Musculoskeletal:N/A  Allergy/Immunology: N/A  Neurological: numbness Psychiatric: N/A Sleep: N/A   ALLERGIES: No Known Allergies  HOME MEDICATIONS: Outpatient Prescriptions Prior to Visit  Medication Sig Dispense Refill  . ALPRAZolam (XANAX) 0.25 MG tablet Take 0.25 mg by mouth at bedtime as needed for anxiety.      Marland Kitchen aspirin EC 81 MG EC tablet Take 1 tablet (81 mg total) by mouth daily.      Marland Kitchen estradiol (VIVELLE-DOT) 0.075 MG/24HR Place 1 patch onto the skin 2 (two) times a week. Monday and Thursday        . ibuprofen (ADVIL,MOTRIN) 200 MG tablet Take 600 mg by mouth every 6 (six) hours as needed for moderate pain. Pain       . metoprolol tartrate (LOPRESSOR) 12.5 mg TABS tablet Take 0.5 tablets (12.5 mg total) by mouth 2 (two) times daily.  60 tablet  5  . nitroGLYCERIN (NITROSTAT) 0.4 MG SL tablet  Place 1 tablet (0.4 mg total) under the tongue every 5 (five) minutes as needed for chest pain.  30 tablet  0  . zolpidem (AMBIEN) 10 MG tablet Take 10 mg by mouth at bedtime as needed for sleep.       No facility-administered medications prior to visit.    PAST MEDICAL HISTORY: Past Medical History  Diagnosis Date  . Hypertension   . Hip pain, left   . Kidney stone     lithotripsy  . Disturbance of skin sensation 11/07/2013    PAST SURGICAL HISTORY: Past Surgical History  Procedure Laterality Date  . Abdominal hysterectomy    . Ovarian cyst removal    . Hip surgery Left     bursectomy  . Tonsillectomy      FAMILY HISTORY: Family History  Problem Relation Age of Onset  . Heart disease Mother   . Hypertension Mother   . Bladder Cancer Mother   . Stroke Mother   . Heart disease Father   . Hypertension Father   . Stroke Father   . Hypertension Sister   . Hypertension Son   . Hypertension Brother   . Migraines Neg Hx   . Seizures Neg Hx   . Hypertension Sister   . Hypertension Sister   . Hypertension Brother     SOCIAL HISTORY: History   Social History  . Marital Status: Married    Spouse Name: N/A  Number of Children: 2  . Years of Education: 12   Occupational History  . Retired     Comptroller  . PT substitute teacher     [employer]   Social History Main Topics  . Smoking status: Never Smoker   . Smokeless tobacco: Never Used  . Alcohol Use: Yes     Comment: OCASIONAL  . Drug Use: No  . Sexual Activity: Not on file   Other Topics Concern  . Not on file   Social History Narrative  . No narrative on file      PHYSICAL EXAM  Filed Vitals:   12/06/13 1424  Temp: 97.4 F (36.3 C)  TempSrc: Oral  Height: 5' 6.5" (1.689 m)  Weight: 162 lb (73.483 kg)   Body mass index is 25.76 kg/(m^2).  Generalized: Well developed, in no acute distress   Neurological examination  Mentation: Alert oriented to time, place, history  taking. Follows all commands speech and language fluent Cranial nerve II-XII: Pupils were equal round reactive to light. Extraocular movements were full, visual field were full on confrontational test.  Motor: The motor testing reveals 5 over 5 strength of all 4 extremities. Good symmetric motor tone is noted throughout.  Sensory: Sensory testing is intact to soft touch on all 4 extremities. No evidence of extinction is noted.  Coordination: Cerebellar testing reveals good finger-nose-finger and heel-to-shin bilaterally.  Gait and station: Gait is normal. Tandem gait is normal. Romberg is negative. No drift is seen.  Reflexes: Deep tendon reflexes are symmetric and normal bilaterally.    DIAGNOSTIC DATA (LABS, IMAGING, TESTING) - I reviewed patient records, labs, notes, testing and imaging myself where available.  MRI of Brain 11/05/2013: IMPRESSION: Negative MRI of the brain. MRA of Brain 11/15/2013: IMPRESSION: Normal MRA head (without). US Carotid Bilateral 11/05/2013: 1. No significant plaque or stenosis.   Lab Results  Component Value Date   WBC 9.2 07/07/2013   HGB 12.8 07/07/2013   HCT 38.7 07/07/2013   MCV 87.6 07/07/2013   PLT 366 07/07/2013      Component Value Date/Time   NA 140 07/07/2013 0337   K 3.1* 07/07/2013 0337   CL 99 07/07/2013 0337   CO2 30 07/07/2013 0337   GLUCOSE 121* 07/07/2013 0337   BUN 17 07/07/2013 0337   CREATININE 0.65 07/07/2013 0941   CALCIUM 9.1 07/07/2013 0337   PROT 7.1 07/15/2011 1410   ALBUMIN 3.7 07/15/2011 1410   AST 23 07/15/2011 1410   ALT 25 07/15/2011 1410   ALKPHOS 63 07/15/2011 1410   BILITOT 0.6 07/15/2011 1410   GFRNONAA >90 07/07/2013 0941   GFRAA >90 07/07/2013 0941   Lab Results  Component Value Date   CHOL 184 07/07/2013   HDL 49 07/07/2013   LDLCALC 567* 07/07/2013   TRIG 126 07/07/2013   CHOLHDL 3.8 07/07/2013   Lab Results  Component Value Date   HGBA1C 5.6 07/07/2013    ASSESSMENT AND PLAN 58 y.o. year old female  has a past medical history of  Hypertension; Hip pain, left; Kidney stone; and Disturbance of skin sensation (11/07/2013). here with:  1. Disturbance of skin sensation  Patient continues to have numbness and tingling. Denies any discomfort. It was thought that these episodes could be sensory seizures or migraines. I will start Topamax 25 mg for 1 week then increase to 50 mg daily at bedtime. Patient is to let us know if she is unable to tolerate this medication and if it is  beneficial. MRI, Carotid Dopplers, EEG and lab work has all been unremarkable. Patient made mention that her blood pressure has been running a little higher than normal. She thinks its related to her stressing about these episodes of numbness. I advised the patient to continue checking her blood pressure and if it continues to run higher than normal she should contact her PCP. Patient takes a daily aspirin. Patient should follow up in 4 months.    Butch PennyMegan Davien Malone, MSN, NP-C 12/06/2013, 2:29 PM [facility] [address], [address] [place], [place] [address] [phone]  Note: This document was prepared with digital dictation and possible smart phrase technology. Any transcriptional errors that result from this process are unintentional.

## 2013-12-19 ENCOUNTER — Ambulatory Visit
Admission: RE | Admit: 2013-12-19 | Discharge: 2013-12-19 | Disposition: A | Discharge status: Home / Self Care | Payer: Federal, State, Local not specified - PPO | Source: Ambulatory Visit | Attending: Obstetrics & Gynecology | Admitting: Obstetrics & Gynecology

## 2013-12-19 DIAGNOSIS — N6310 Unspecified lump in the right breast, unspecified quadrant: Secondary | ICD-10-CM

## 2013-12-31 ENCOUNTER — Other Ambulatory Visit: Payer: Federal, State, Local not specified - PPO

## 2014-01-01 ENCOUNTER — Other Ambulatory Visit (INDEPENDENT_AMBULATORY_CARE_PROVIDER_SITE_OTHER): Payer: Federal, State, Local not specified - PPO

## 2014-01-01 DIAGNOSIS — E785 Hyperlipidemia, unspecified: Secondary | ICD-10-CM

## 2014-01-01 DIAGNOSIS — I10 Essential (primary) hypertension: Secondary | ICD-10-CM

## 2014-01-01 LAB — LIPID PANEL
CHOL/HDL RATIO: 4
CHOLESTEROL: 216 mg/dL — AB (ref 0–200)
HDL: 50 mg/dL (ref >39.00)
LDL CALC: 138 mg/dL — AB (ref 0–99)
NonHDL: 166
TRIGLYCERIDES: 142 mg/dL (ref 0.0–149.0)
VLDL: 28.4 mg/dL (ref 0.0–40.0)

## 2014-01-01 LAB — ALT: ALT: 16 U/L (ref 0–35)

## 2014-01-17 ENCOUNTER — Ambulatory Visit (INDEPENDENT_AMBULATORY_CARE_PROVIDER_SITE_OTHER): Payer: Federal, State, Local not specified - PPO | Admitting: Cardiology

## 2014-01-17 ENCOUNTER — Encounter: Payer: Self-pay | Admitting: Cardiology

## 2014-01-17 VITALS — BP 130/81 | HR 73 | Ht 66.0 in | Wt 154.0 lb

## 2014-01-17 DIAGNOSIS — R202 Paresthesia of skin: Secondary | ICD-10-CM

## 2014-01-17 DIAGNOSIS — I10 Essential (primary) hypertension: Secondary | ICD-10-CM

## 2014-01-17 DIAGNOSIS — R209 Unspecified disturbances of skin sensation: Secondary | ICD-10-CM

## 2014-01-17 DIAGNOSIS — E7800 Pure hypercholesterolemia, unspecified: Secondary | ICD-10-CM

## 2014-01-17 MED ORDER — METOPROLOL SUCCINATE ER 25 MG PO TB24: 25.0000 mg | ORAL_TABLET | Freq: Every day | ORAL | Status: DC

## 2014-01-17 NOTE — Progress Notes (Signed)
[address] [place], [place]  [address] Phone: [phone] Fax:  [phone]  Date:  01/17/2014   ID:  Sheila AbbeRetta R Sagan, DOB [birth year], MRN [id number]  PCP:  Gretel AcreNNODI, ADAKU, MD   History of Present Illness: Sheila Allen is a 58 y.o. female who was recently observed in the hospital with chest pain, discharge on 07/07/13 here for chest pain followup.   When taking whole pill of atorvastatin after taking 30 min later, face tingles. Took half and felt better. Had recent episode without medication. She developed sudden onset of chest pain, shortness of breath, left arm tingling and tightness in her chest that was a burning sensation in the center but felt different from her heartburn. It had resolved. She did not feel this in the past. Heart flashes well. Cardiac biomarkers negative. EKG unremarkable. She's had recent episode of diverticulitis with diarrhea followed by intermittent constipation. Potassium slightly low at 3.1. Cardiac per square was 3. 1 respective hypertension and age of 58. Exercise treadmill test was low risk.  She's not had any further discomfort.  HCTZ was stopped, hypokalemia.     Wt Readings from Last 3 Encounters:  01/17/14 154 lb (69.854 kg)  12/06/13 162 lb (73.483 kg)  11/07/13 162 lb (73.483 kg)     Past Medical History  Diagnosis Date  . Hypertension   . Hip pain, left   . Kidney stone     lithotripsy  . Disturbance of skin sensation 11/07/2013    Past Surgical History  Procedure Laterality Date  . Abdominal hysterectomy    . Ovarian cyst removal    . Hip surgery Left     bursectomy  . Tonsillectomy      Current Outpatient Prescriptions  Medication Sig Dispense Refill  . ALPRAZolam (XANAX) 0.25 MG tablet Take 0.25 mg by mouth at bedtime as needed for anxiety.      Marland Kitchen. aspirin EC 81 MG EC tablet Take 1 tablet (81 mg total) by mouth daily.      Marland Kitchen. estradiol (VIVELLE-DOT) 0.075 MG/24HR Place 1 patch onto the skin 2 (two) times a  week. Monday and Thursday        . ibuprofen (ADVIL,MOTRIN) 200 MG tablet Take 600 mg by mouth every 6 (six) hours as needed for moderate pain. Pain       . metoprolol succinate (TOPROL-XL) 25 MG 24 hr tablet Take 25 mg by mouth daily.      . nitroGLYCERIN (NITROSTAT) 0.4 MG SL tablet Place 1 tablet (0.4 mg total) under the tongue every 5 (five) minutes as needed for chest pain.  30 tablet  0  . topiramate (TOPAMAX) 25 MG tablet Take 1 tablet daily for 1 week then increase to 2 tablet daily at night  60 tablet  3  . zolpidem (AMBIEN) 10 MG tablet Take 10 mg by mouth at bedtime as needed for sleep.       No current facility-administered medications for this visit.    Allergies:   No Known Allergies FHX: MI in 80's. Mother and Father.   Social History:  The patient  reports that she has never smoked. She has never used smokeless tobacco. She reports that she drinks alcohol. She reports that she does not use illicit drugs.   ROS:  Please see the history of present illness.   No syncope, no bleeding, no orthopnea.   PHYSICAL EXAM: VS:  BP 130/81  Pulse 73  Ht  5\' 6"  (1.676 m)  Wt 154 lb (69.854 kg)  BMI 24.87 kg/m2 Well nourished, well developed, in no acute distress HEENT: normal Neck: no JVD Cardiac:  normal S1, S2; RRR; no murmur Lungs:  clear to auscultation bilaterally, no wheezing, rhonchi or rales Abd: soft, nontender, no hepatomegaly Ext: no edema Skin: warm and dry Neuro: no focal abnormalities noted, CN's intact  EKG:  Sinus rhythm, no ST segment changes 07/07/13 ETT: 08/14/13 - low risk, no evidence of ischemia.  ASSESSMENT AND PLAN:  1. Atypical chest pain-reassuring ETT. No early family history of CAD, LDL was 110 not significantly elevated.  My suspicion is low. Continue with primary prevention strategies. Exercise.  2. Hypertension-her hydrochlorothiazide was discontinued. Hypokalemia. We will go head and consolidate to metoprolol succinate 25 mg once a day. Dr. Ihor Dow  may continue with refills.  3.   Hyperlipidemia-quite mild. She is now off of statin therapy because of paresthesias. Her paresthesias have improved slightly however they were not completely alleviated by discontinuation of this class of medication. She is working closely with neurology on the problem. From a cardiac standpoint, it is not absolutely essential that she is on statin however it may be beneficial in the long term. I do not feel strongly that she absolutely needs this medication at this point. Continue with lifestyle modification.   We will see her back on as-needed basis.  Signed, Donato Schultz, MD [facility]  01/17/2014 9:18 AM

## 2014-01-17 NOTE — Patient Instructions (Addendum)
Please change from metoprolol tartrate to metoprolol succinate.  A new prescription has been sent to your pharmacy.  Further refills will need to come from your primary care doctor. Continue all other medications as listed.  Follow up as needed.

## 2014-02-18 ENCOUNTER — Telehealth: Payer: Self-pay | Admitting: Neurology

## 2014-02-18 NOTE — Telephone Encounter (Signed)
Patient calling to state that she would like sooner appointment than the one scheduled in October, wants to come in this week, states she is still having numbness and tingling on the left side and that it's getting worse. Please call patient and advise.

## 2014-02-18 NOTE — Telephone Encounter (Signed)
Spoke with patient she states that she has been having numbness and tingling in her left arm and hand and wanted to come in for an appointment, patient was scheduled with Dr. Anne HahnWillis on 02/20/14 at 2:30 pm.

## 2014-02-20 ENCOUNTER — Encounter: Payer: Self-pay | Admitting: Neurology

## 2014-02-20 ENCOUNTER — Ambulatory Visit (INDEPENDENT_AMBULATORY_CARE_PROVIDER_SITE_OTHER): Payer: Federal, State, Local not specified - PPO | Admitting: Neurology

## 2014-02-20 VITALS — BP 148/83 | HR 69 | Wt 154.0 lb

## 2014-02-20 DIAGNOSIS — R209 Unspecified disturbances of skin sensation: Secondary | ICD-10-CM

## 2014-02-20 NOTE — Progress Notes (Signed)
Reason for visit: Numbness  Sheila Allen is an 58 y.o. female  History of present illness:  [name] is a 58 year old right-handed white female with a history of primarily left-sided sensory alterations. The patient began noting episodes of tingling in the left thumb, going to the left arm and hand, and occasionally affecting the left foot with a "jacksonian march". The episodes initially were intermittent, but more recently, the patient has noted that the sensory symptoms are daily in nature, more persistent, and now affecting intermittently the right hand and right foot. She denies any weakness of the extremities, but she has noted some ongoing problems with soreness and achiness at the base of the left neck. The patient has undergone MRI evaluation of the brain that was unremarkable, and an EEG study was normal. She is on Topamax without benefit. She returns to this office for an evaluation.  Past Medical History  Diagnosis Date  . Hypertension   . Hip pain, left   . Kidney stone     lithotripsy  . Disturbance of skin sensation 11/07/2013    Past Surgical History  Procedure Laterality Date  . Abdominal hysterectomy    . Ovarian cyst removal    . Hip surgery Left     bursectomy  . Tonsillectomy      Family History  Problem Relation Age of Onset  . Heart disease Mother   . Hypertension Mother   . Bladder Cancer Mother   . Stroke Mother   . Heart disease Father   . Hypertension Father   . Stroke Father   . Hypertension Sister   . Hypertension Son   . Hypertension Brother   . Migraines Neg Hx   . Seizures Neg Hx   . Hypertension Sister   . Hypertension Sister   . Hypertension Brother     Social history:  reports that she has never smoked. She has never used smokeless tobacco. She reports that she drinks alcohol. She reports that she does not use illicit drugs.   No Known Allergies  Medications:  Current Outpatient Prescriptions on File Prior to Visit    Medication Sig Dispense Refill  . ALPRAZolam (XANAX) 0.25 MG tablet Take 0.25 mg by mouth at bedtime as needed for anxiety.      Marland Kitchen aspirin EC 81 MG EC tablet Take 1 tablet (81 mg total) by mouth daily.      Marland Kitchen estradiol (VIVELLE-DOT) 0.075 MG/24HR Place 1 patch onto the skin 2 (two) times a week. Monday and Thursday        . topiramate (TOPAMAX) 25 MG tablet Take 1 tablet daily for 1 week then increase to 2 tablet daily at night  60 tablet  3  . zolpidem (AMBIEN) 10 MG tablet Take 10 mg by mouth at bedtime as needed for sleep.       No current facility-administered medications on file prior to visit.    ROS:  Out of a complete 14 system review of symptoms, the patient complains only of the following symptoms, and all other reviewed systems are negative.  Numbness  Blood pressure 148/83, pulse 69, weight 154 lb (69.854 kg).  Physical Exam  General: The patient is alert and cooperative at the time of the examination.  Neuromuscular: Range of movement of the cervical spine is full.  Skin: No significant peripheral edema is noted.   Neurologic Exam  Mental status: The patient is oriented x 3.  Cranial nerves: Facial symmetry is present. Speech  is normal, no aphasia or dysarthria is noted. Extraocular movements are full. Visual fields are full.  Motor: The patient has good strength in all 4 extremities.  Sensory examination: Soft touch sensation is symmetric on the face, arms, and legs.  Coordination: The patient has good finger-nose-finger and heel-to-shin bilaterally.  Gait and station: The patient has a normal gait. Tandem gait is normal. Romberg is negative. No drift is seen.  Reflexes: Deep tendon reflexes are symmetric.   Assessment/Plan:  1. Sensory alteration, etiology unclear  The patient continues to have ongoing sensory changes primarily involving the left tongue and left body, but she is now having issues on the right side as well. The patient has left-sided  neck discomfort, and we will set her up for MRI evaluation of the cervical spine, and check blood work today. She will followup in October 2015.  Sheila Allen. Keith Willis MD 02/20/2014 8:20 PM  [facility] [address] [place], [place] [id number]  Phone [phone] Fax [phone]

## 2014-02-22 LAB — ANGIOTENSIN CONVERTING ENZYME: ANGIO CONVERT ENZYME: 31 U/L (ref 14–82)

## 2014-02-22 LAB — RHEUMATOID FACTOR: RHEUMATOID FACTOR: 8.5 [IU]/mL (ref 0.0–13.9)

## 2014-02-22 LAB — COPPER, SERUM: Copper: 106 ug/dL (ref 72–166)

## 2014-02-22 LAB — ANA W/REFLEX: Anti Nuclear Antibody(ANA): NEGATIVE

## 2014-02-22 LAB — SEDIMENTATION RATE: Sed Rate: 4 mm/hr (ref 0–40)

## 2014-02-22 LAB — VITAMIN B12: Vitamin B-12: 454 pg/mL (ref 211–946)

## 2014-02-22 LAB — LYME, TOTAL AB TEST/REFLEX: Lyme IgG/IgM Ab: <0.91 {ISR} (ref 0.00–0.90)

## 2014-02-27 ENCOUNTER — Ambulatory Visit (INDEPENDENT_AMBULATORY_CARE_PROVIDER_SITE_OTHER): Payer: Federal, State, Local not specified - PPO

## 2014-02-27 DIAGNOSIS — R209 Unspecified disturbances of skin sensation: Secondary | ICD-10-CM

## 2014-02-27 MED ORDER — GADOPENTETATE DIMEGLUMINE 469.01 MG/ML IV SOLN: 15.0000 mL | Freq: Once | INTRAVENOUS | Status: AC | PRN

## 2014-02-28 ENCOUNTER — Telehealth: Payer: Self-pay | Admitting: Neurology

## 2014-02-28 NOTE — Telephone Encounter (Signed)
I called patient. The MRI the cervical spine does not show spinal cord compression, but he does show multilevel spondylosis, and multilevel bilateral neuroforaminal stenosis. This could be an etiology of some left shoulder discomfort and some tingling down the left hand. The patient is seen by Dr. Ethelene Hal, and she is getting epidural steroid injections of the low back. The patient wonders whether or not this could be also done on her cervical spine. This may be of some help. It is of no benefit, we may consider EMG and nerve conduction study of the left arm and leg in the future.   MRI cervical spine 02/27/2014:  Impression   Abnormal MRI cervical spine (with and without) demonstrating: 1. At C5-6: uncovertebral joint hypertrophy and facet hypertrophy with  mild spinal stenosis and severe biforaminal stenosis; no cord signal  abnormalities.  2. At C6-7: uncovertebral joint hypertrophy and facet hypertrophy with  mild spinal stenosis, severe right and moderate-severe left foraminal  stenosis; no cord signal abnormalities. 3. At C4-5: uncovertebral joint hypertrophy and facet hypertrophy with  severe biforaminal stenosis. 4. At C3-4: uncovertebral joint hypertrophy and facet hypertrophy with  severe right and moderate-severe left foraminal stenosis. 5. At C7-T1: disc bulging with moderate right foraminal stenosis. 6. No intrinsic or abnormal enhancing spinal cord lesions.

## 2014-03-07 ENCOUNTER — Telehealth: Payer: Self-pay | Admitting: Neurology

## 2014-03-07 NOTE — Telephone Encounter (Signed)
I called patient. She is to contact Dr. Ethelene Hal to see if he will do the epidural steroid injections in the neck, if not, we can set this up through [facility] imaging.

## 2014-03-07 NOTE — Telephone Encounter (Signed)
Patient calling to state that she never received a call about setting up her neck injections at Dr. Ramos' office, [facility]. Please call and advise.  

## 2014-03-07 NOTE — Telephone Encounter (Signed)
Patient calling to state that she never received a call about setting up her neck injections at Dr. Ethelene Hal' office, [facility]. Please call and advise.

## 2014-04-10 ENCOUNTER — Ambulatory Visit (INDEPENDENT_AMBULATORY_CARE_PROVIDER_SITE_OTHER): Payer: Federal, State, Local not specified - PPO | Admitting: Adult Health

## 2014-04-10 ENCOUNTER — Encounter: Payer: Self-pay | Admitting: Adult Health

## 2014-04-10 VITALS — BP 109/68 | HR 66 | Ht 65.5 in | Wt 147.0 lb

## 2014-04-10 DIAGNOSIS — R209 Unspecified disturbances of skin sensation: Secondary | ICD-10-CM

## 2014-04-10 MED ORDER — DULOXETINE HCL 30 MG PO CPEP: ORAL_CAPSULE | ORAL | Status: DC

## 2014-04-10 MED ORDER — TOPIRAMATE 25 MG PO TABS: 25.0000 mg | ORAL_TABLET | Freq: Every day | ORAL | Status: DC

## 2014-04-10 NOTE — Patient Instructions (Signed)
Duloxetine delayed-release capsules What is this medicine? DULOXETINE (doo LOX e teen) is used to treat depression, anxiety, and different types of chronic pain. This medicine may be used for other purposes; ask your health care provider or pharmacist if you have questions. COMMON BRAND NAME(S): Cymbalta What should I tell my health care provider before I take this medicine? They need to know if you have any of these conditions: -bipolar disorder or a family history of bipolar disorder -glaucoma -kidney disease -liver disease -suicidal thoughts or a previous suicide attempt -taken medicines called MAOIs like Carbex, Eldepryl, Marplan, Nardil, and Parnate within 14 days -an unusual reaction to duloxetine, other medicines, foods, dyes, or preservatives -pregnant or trying to get pregnant -breast-feeding How should I use this medicine? Take this medicine by mouth with a glass of water. Follow the directions on the prescription label. Do not cut, crush or chew this medicine. You can take this medicine with or without food. Take your medicine at regular intervals. Do not take your medicine more often than directed. Do not stop taking this medicine suddenly except upon the advice of your doctor. Stopping this medicine too quickly may cause serious side effects or your condition may worsen. A special MedGuide will be given to you by the pharmacist with each prescription and refill. Be sure to read this information carefully each time. Talk to your pediatrician regarding the use of this medicine in children. While this drug may be prescribed for children as young as 7 years of age for selected conditions, precautions do apply. Overdosage: If you think you have taken too much of this medicine contact a poison control center or emergency room at once. NOTE: This medicine is only for you. Do not share this medicine with others. What if I miss a dose? If you miss a dose, take it as soon as you can. If it  is almost time for your next dose, take only that dose. Do not take double or extra doses. What may interact with this medicine? Do not take this medicine with any of the following medications: -certain diet drugs like dexfenfluramine, fenfluramine -desvenlafaxine -linezolid -MAOIs like Azilect, Carbex, Eldepryl, Marplan, Nardil, and Parnate -methylene blue (intravenous) -milnacipran -thioridazine -venlafaxine This medicine may also interact with the following medications: -alcohol -aspirin and aspirin-like medicines -certain antibiotics like ciprofloxacin and enoxacin -certain medicines for blood pressure, heart disease, irregular heart beat -certain medicines for depression, anxiety, or psychotic disturbances -certain medicines for migraine headache like almotriptan, eletriptan, frovatriptan, naratriptan, rizatriptan, sumatriptan, zolmitriptan -certain medicines that treat or prevent blood clots like warfarin, enoxaparin, and dalteparin -cimetidine -fentanyl -lithium -NSAIDS, medicines for pain and inflammation, like ibuprofen or naproxen -phentermine -procarbazine -sibutramine -St. John's wort -theophylline -tramadol -tryptophan This list may not describe all possible interactions. Give your health care provider a list of all the medicines, herbs, non-prescription drugs, or dietary supplements you use. Also tell them if you smoke, drink alcohol, or use illegal drugs. Some items may interact with your medicine. What should I watch for while using this medicine? Tell your doctor if your symptoms do not get better or if they get worse. Visit your doctor or health care professional for regular checks on your progress. Because it may take several weeks to see the full effects of this medicine, it is important to continue your treatment as prescribed by your doctor. Patients and their families should watch out for new or worsening thoughts of suicide or depression. Also watch out for  sudden changes in   feelings such as feeling anxious, agitated, panicky, irritable, hostile, aggressive, impulsive, severely restless, overly excited and hyperactive, or not being able to sleep. If this happens, especially at the beginning of treatment or after a change in dose, call your health care professional. You may get drowsy or dizzy. Do not drive, use machinery, or do anything that needs mental alertness until you know how this medicine affects you. Do not stand or sit up quickly, especially if you are an older patient. This reduces the risk of dizzy or fainting spells. Alcohol may interfere with the effect of this medicine. Avoid alcoholic drinks. This medicine can cause an increase in blood pressure. This medicine can also cause a sudden drop in your blood pressure, which may make you feel faint and increase the chance of a fall. These effects are most common when you first start the medicine or when the dose is increased, or during use of other medicines that can cause a sudden drop in blood pressure. Check with your doctor for instructions on monitoring your blood pressure while taking this medicine. Your mouth may get dry. Chewing sugarless gum or sucking hard candy, and drinking plenty of water may help. Contact your doctor if the problem does not go away or is severe. What side effects may I notice from receiving this medicine? Side effects that you should report to your doctor or health care professional as soon as possible: -allergic reactions like skin rash, itching or hives, swelling of the face, lips, or tongue -changes in blood pressure -confusion -dark urine -dizziness -fast talking and excited feelings or actions that are out of control -fast, irregular heartbeat -fever -general ill feeling or flu-like symptoms -hallucination, loss of contact with reality -light-colored stools -loss of balance or coordination -redness, blistering, peeling or loosening of the skin, including  inside the mouth -right upper belly pain -seizures -suicidal thoughts or other mood changes -trouble concentrating -trouble passing urine or change in the amount of urine -unusual bleeding or bruising -unusually weak or tired -yellowing of the eyes or skin Side effects that usually do not require medical attention (report to your doctor or health care professional if they continue or are bothersome): -blurred vision -change in appetite -change in sex drive or performance -headache -increased sweating -nausea This list may not describe all possible side effects. Call your doctor for medical advice about side effects. You may report side effects to FDA at 1-800-FDA-1088. Where should I keep my medicine? Keep out of the reach of children. Store at room temperature between 15 and 30 degrees C (59 and 86 degrees F). Throw away any unused medicine after the expiration date. NOTE: This sheet is a summary. It may not cover all possible information. If you have questions about this medicine, talk to your doctor, pharmacist, or health care provider.  2015, Elsevier/Gold Standard. (2013-06-12 14:20:31)  

## 2014-04-10 NOTE — Progress Notes (Signed)
I have read the note, and I agree with the clinical assessment and plan.  Sheila Allen   

## 2014-04-10 NOTE — Progress Notes (Signed)
PATIENT: Sheila AbbeRetta R Allen DOB: 1956/01/29  REASON FOR VISIT: follow up HISTORY FROM: patient  HISTORY OF PRESENT ILLNESS: Sheila Allen is a 58 year old female with a history of left-sided sensory alterations that has evolved to the right side and neck pain. She returns today for follow-up. She continues to have the tingling on the left arm and face. She has had some tingling in the right hand and face but not as frequently as on the left side. She states that the tingling occurs daily but intermittent on the left side of the face and arm. She states that the numbness and tingling is not traveling up or down her arm. For the last month she has had pain in the back of her leg that occurs mostly at night. It does not get worse with walking. She states there is not a lesion or knot on the back of the knee. She had a good friend that died suddenly in March and she wonders if that may have something to do with all these new symptoms.   HISTORY (CW): 58 year old right-handed white female with a history of primarily left-sided sensory alterations. The patient began noting episodes of tingling in the left thumb, going to the left arm and hand, and occasionally affecting the left foot with a "jacksonian march". The episodes initially were intermittent, but more recently, the patient has noted that the sensory symptoms are daily in nature, more persistent, and now affecting intermittently the right hand and right foot. She denies any weakness of the extremities, but she has noted some ongoing problems with soreness and achiness at the base of the left neck. The patient has undergone MRI evaluation of the brain that was unremarkable, and an EEG study was normal. She is on Topamax without benefit. She returns to this office for an evaluation.  REVIEW OF SYSTEMS: Full 14 system review of systems performed and notable only for:  Constitutional: N/A  Eyes: N/A Ear/Nose/Throat: N/A  Skin: N/A  Cardiovascular: N/A    Respiratory: N/A  Gastrointestinal: N/A  Genitourinary: N/A Hematology/Lymphatic: N/A  Endocrine: N/A Musculoskeletal:N/A  Allergy/Immunology: N/A  Neurological: Numbness and tingling in the left arm and and face Psychiatric: N/A Sleep: N/A   ALLERGIES: No Known Allergies  HOME MEDICATIONS: Outpatient Prescriptions Prior to Visit  Medication Sig Dispense Refill  . ALPRAZolam (XANAX) 0.25 MG tablet Take 0.25 mg by mouth at bedtime as needed for anxiety.      Marland Kitchen. aspirin EC 81 MG EC tablet Take 1 tablet (81 mg total) by mouth daily.      Marland Kitchen. estradiol (VIVELLE-DOT) 0.075 MG/24HR Place 1 patch onto the skin 2 (two) times a week. Monday and Thursday        . metoprolol succinate (TOPROL-XL) 50 MG 24 hr tablet Take 50 mg by mouth daily. Take with or immediately following a meal.      . topiramate (TOPAMAX) 25 MG tablet Take 1 tablet daily for 1 week then increase to 2 tablet daily at night  60 tablet  3  . zolpidem (AMBIEN) 10 MG tablet Take 10 mg by mouth at bedtime as needed for sleep.       No facility-administered medications prior to visit.    PAST MEDICAL HISTORY: Past Medical History  Diagnosis Date  . Hypertension   . Hip pain, left   . Kidney stone     lithotripsy  . Disturbance of skin sensation 11/07/2013    PAST SURGICAL HISTORY: Past Surgical History  Procedure Laterality Date  . Abdominal hysterectomy    . Ovarian cyst removal    . Hip surgery Left     bursectomy  . Tonsillectomy      FAMILY HISTORY: Family History  Problem Relation Age of Onset  . Heart disease Mother   . Hypertension Mother   . Bladder Cancer Mother   . Stroke Mother   . Heart disease Father   . Hypertension Father   . Stroke Father   . Hypertension Sister   . Hypertension Son   . Hypertension Brother   . Migraines Neg Hx   . Seizures Neg Hx   . Hypertension Sister   . Hypertension Sister   . Hypertension Brother     SOCIAL HISTORY: History   Social History  . Marital  Status: Married    Spouse Name: Perlie Gold    Number of Children: 2  . Years of Education: 12   Occupational History  . Retired     Comptroller  . PT substitute teacher     [employer]   Social History Main Topics  . Smoking status: Never Smoker   . Smokeless tobacco: Never Used  . Alcohol Use: Yes     Comment: OCASIONAL  . Drug Use: No  . Sexual Activity: Not on file   Other Topics Concern  . Not on file   Social History Narrative   Patient lives at home with family.   Caffeine Use: 1 soda daily      PHYSICAL EXAM  Filed Vitals:   04/10/14 1131  BP: 109/68  Pulse: 66  Height: 5' 5.5" (1.664 m)  Weight: 147 lb (66.679 kg)   Body mass index is 24.08 kg/(m^2).  Generalized: Well developed, in no acute distress   Neurological examination  Mentation: Alert oriented to time, place, history taking. Follows all commands speech and language fluent Cranial nerve II-XII: Pupils were equal round reactive to light. Extraocular movements were full, visual field were full on confrontational test. Facial sensation and strength were normal. Uvula tongue midline. Head turning and shoulder shrug  were normal and symmetric. Motor: The motor testing reveals 5 over 5 strength of all 4 extremities. Good symmetric motor tone is noted throughout.  Sensory: Sensory testing is intact to soft touch and pinprick on all 4 extremities. No evidence of extinction is noted.  Coordination: Cerebellar testing reveals good finger-nose-finger and heel-to-shin bilaterally.  Gait and station: Gait is normal. Tandem gait is normal. Romberg is negative. No drift is seen.  Reflexes: Deep tendon reflexes are symmetric and normal bilaterally.    DIAGNOSTIC DATA (LABS, IMAGING, TESTING) - I reviewed patient records, labs, notes, testing and imaging myself where available.  Lab Results  Component Value Date   WBC 9.2 07/07/2013   HGB 12.8 07/07/2013   HCT 38.7 07/07/2013   MCV 87.6 07/07/2013   PLT  366 07/07/2013      Component Value Date/Time   NA 140 07/07/2013 0337   K 3.1* 07/07/2013 0337   CL 99 07/07/2013 0337   CO2 30 07/07/2013 0337   GLUCOSE 121* 07/07/2013 0337   BUN 17 07/07/2013 0337   CREATININE 0.65 07/07/2013 0941   CALCIUM 9.1 07/07/2013 0337   PROT 7.1 07/15/2011 1410   ALBUMIN 3.7 07/15/2011 1410   AST 23 07/15/2011 1410   ALT 16 01/01/2014 0739   ALKPHOS 63 07/15/2011 1410   BILITOT 0.6 07/15/2011 1410   GFRNONAA >90 07/07/2013 0941   GFRAA >90 07/07/2013  1610   Lab Results  Component Value Date   CHOL 216* 01/01/2014   HDL 50.00 01/01/2014   LDLCALC 138* 01/01/2014   TRIG 142.0 01/01/2014   CHOLHDL 4 01/01/2014   Lab Results  Component Value Date   HGBA1C 5.6 07/07/2013   Lab Results  Component Value Date   VITAMINB12 454 02/20/2014   No results found for this basename: TSH      ASSESSMENT AND PLAN 58 y.o. year old female  has a past medical history of Hypertension; Hip pain, left; Kidney stone; and Disturbance of skin sensation (11/07/2013). here with:  1. Disturbance of skin sensation  The patient continues to have numbness and tingling primarily on the left side. She states that the numbness and tingling does not start and travel up her arm or her face. She is currently on Topamax but has not had any benefit with this medication. We will wean the patient off of the Topamax. She will take 25 mg daily for one week then stop. The patient did have a friend that passed away in Oct 08, 2022 suddenly. She is unsure if that had any effect on her symptoms. I will start the patient on Cymbalta 30 mg daily for one week then increase to 30 mg twice a day. In the past the patient has had a thorough workup that has been unremarkable. If patient's symptoms worsen or she develops new symptoms she should let us know. Otherwise she should followup in 2 months or sooner if needed.   Butch Penny, MSN, NP-C 04/10/2014, 11:29 AM [facility] [address] [place], [place]  [address] [phone]  Note: This document was prepared with digital dictation and possible smart phrase technology. Any transcriptional errors that result from this process are unintentional.

## 2014-04-15 ENCOUNTER — Telehealth: Payer: Self-pay | Admitting: Adult Health

## 2014-04-15 ENCOUNTER — Other Ambulatory Visit: Payer: Self-pay | Admitting: Obstetrics & Gynecology

## 2014-04-15 DIAGNOSIS — N6001 Solitary cyst of right breast: Secondary | ICD-10-CM

## 2014-04-15 NOTE — Telephone Encounter (Signed)
Patient questioning DULoxetine (CYMBALTA) 30 MG capsule instructions for second week.  Was she instructed to take 1 tab in am or 1 tab at pm or both at night?  Please call and advise.

## 2014-04-15 NOTE — Telephone Encounter (Deleted)
Patient stated Sheila MilletMegan, NP wanted her

## 2014-04-15 NOTE — Telephone Encounter (Signed)
Per notes: I will start the patient on Cymbalta 30 mg daily for one week then increase to 30 mg twice a day. I called back.  She is aware and will call us back if anything further is needed.

## 2014-06-10 ENCOUNTER — Ambulatory Visit
Admission: RE | Admit: 2014-06-10 | Discharge: 2014-06-10 | Disposition: A | Discharge status: Home / Self Care | Payer: Federal, State, Local not specified - PPO | Source: Ambulatory Visit | Attending: Obstetrics & Gynecology | Admitting: Obstetrics & Gynecology

## 2014-06-10 DIAGNOSIS — N6001 Solitary cyst of right breast: Secondary | ICD-10-CM

## 2014-06-17 ENCOUNTER — Encounter: Payer: Self-pay | Admitting: Adult Health

## 2014-06-17 ENCOUNTER — Ambulatory Visit (INDEPENDENT_AMBULATORY_CARE_PROVIDER_SITE_OTHER): Payer: Federal, State, Local not specified - PPO | Admitting: Adult Health

## 2014-06-17 VITALS — BP 141/82 | HR 65 | Temp 97.4°F | Ht 65.5 in | Wt 155.0 lb

## 2014-06-17 DIAGNOSIS — R209 Unspecified disturbances of skin sensation: Secondary | ICD-10-CM

## 2014-06-17 MED ORDER — DULOXETINE HCL 30 MG PO CPEP: 30.0000 mg | ORAL_CAPSULE | Freq: Two times a day (BID) | ORAL | Status: DC

## 2014-06-17 NOTE — Progress Notes (Signed)
I have read the note, and I agree with the clinical assessment and plan.  WILLIS,CHARLES KEITH   

## 2014-06-17 NOTE — Patient Instructions (Signed)
Continue taking Cymbalta 30 mg twice a day.  If your symptoms return please let us know.   Duloxetine delayed-release capsules What is this medicine? DULOXETINE (doo LOX e teen) is used to treat depression, anxiety, and different types of chronic pain. This medicine may be used for other purposes; ask your health care provider or pharmacist if you have questions. COMMON BRAND NAME(S): Cymbalta What should I tell my health care provider before I take this medicine? They need to know if you have any of these conditions: -bipolar disorder or a family history of bipolar disorder -glaucoma -kidney disease -liver disease -suicidal thoughts or a previous suicide attempt -taken medicines called MAOIs like Carbex, Eldepryl, Marplan, Nardil, and Parnate within 14 days -an unusual reaction to duloxetine, other medicines, foods, dyes, or preservatives -pregnant or trying to get pregnant -breast-feeding How should I use this medicine? Take this medicine by mouth with a glass of water. Follow the directions on the prescription label. Do not cut, crush or chew this medicine. You can take this medicine with or without food. Take your medicine at regular intervals. Do not take your medicine more often than directed. Do not stop taking this medicine suddenly except upon the advice of your doctor. Stopping this medicine too quickly may cause serious side effects or your condition may worsen. A special MedGuide will be given to you by the pharmacist with each prescription and refill. Be sure to read this information carefully each time. Talk to your pediatrician regarding the use of this medicine in children. While this drug may be prescribed for children as young as 567 years of age for selected conditions, precautions do apply. Overdosage: If you think you have taken too much of this medicine contact a poison control center or emergency room at once. NOTE: This medicine is only for you. Do not share this  medicine with others. What if I miss a dose? If you miss a dose, take it as soon as you can. If it is almost time for your next dose, take only that dose. Do not take double or extra doses. What may interact with this medicine? Do not take this medicine with any of the following medications: -certain diet drugs like dexfenfluramine, fenfluramine -desvenlafaxine -linezolid -MAOIs like Azilect, Carbex, Eldepryl, Marplan, Nardil, and Parnate -methylene blue (intravenous) -milnacipran -thioridazine -venlafaxine This medicine may also interact with the following medications: -alcohol -aspirin and aspirin-like medicines -certain antibiotics like ciprofloxacin and enoxacin -certain medicines for blood pressure, heart disease, irregular heart beat -certain medicines for depression, anxiety, or psychotic disturbances -certain medicines for migraine headache like almotriptan, eletriptan, frovatriptan, naratriptan, rizatriptan, sumatriptan, zolmitriptan -certain medicines that treat or prevent blood clots like warfarin, enoxaparin, and dalteparin -cimetidine -fentanyl -lithium -NSAIDS, medicines for pain and inflammation, like ibuprofen or naproxen -phentermine -procarbazine -sibutramine -St. John's wort -theophylline -tramadol -tryptophan This list may not describe all possible interactions. Give your health care provider a list of all the medicines, herbs, non-prescription drugs, or dietary supplements you use. Also tell them if you smoke, drink alcohol, or use illegal drugs. Some items may interact with your medicine. What should I watch for while using this medicine? Tell your doctor if your symptoms do not get better or if they get worse. Visit your doctor or health care professional for regular checks on your progress. Because it may take several weeks to see the full effects of this medicine, it is important to continue your treatment as prescribed by your doctor. Patients and their  families  should watch out for new or worsening thoughts of suicide or depression. Also watch out for sudden changes in feelings such as feeling anxious, agitated, panicky, irritable, hostile, aggressive, impulsive, severely restless, overly excited and hyperactive, or not being able to sleep. If this happens, especially at the beginning of treatment or after a change in dose, call your health care professional. Dennis Bast may get drowsy or dizzy. Do not drive, use machinery, or do anything that needs mental alertness until you know how this medicine affects you. Do not stand or sit up quickly, especially if you are an older patient. This reduces the risk of dizzy or fainting spells. Alcohol may interfere with the effect of this medicine. Avoid alcoholic drinks. This medicine can cause an increase in blood pressure. This medicine can also cause a sudden drop in your blood pressure, which may make you feel faint and increase the chance of a fall. These effects are most common when you first start the medicine or when the dose is increased, or during use of other medicines that can cause a sudden drop in blood pressure. Check with your doctor for instructions on monitoring your blood pressure while taking this medicine. Your mouth may get dry. Chewing sugarless gum or sucking hard candy, and drinking plenty of water may help. Contact your doctor if the problem does not go away or is severe. What side effects may I notice from receiving this medicine? Side effects that you should report to your doctor or health care professional as soon as possible: -allergic reactions like skin rash, itching or hives, swelling of the face, lips, or tongue -changes in blood pressure -confusion -dark urine -dizziness -fast talking and excited feelings or actions that are out of control -fast, irregular heartbeat -fever -general ill feeling or flu-like symptoms -hallucination, loss of contact with reality -light-colored  stools -loss of balance or coordination -redness, blistering, peeling or loosening of the skin, including inside the mouth -right upper belly pain -seizures -suicidal thoughts or other mood changes -trouble concentrating -trouble passing urine or change in the amount of urine -unusual bleeding or bruising -unusually weak or tired -yellowing of the eyes or skin Side effects that usually do not require medical attention (report to your doctor or health care professional if they continue or are bothersome): -blurred vision -change in appetite -change in sex drive or performance -headache -increased sweating -nausea This list may not describe all possible side effects. Call your doctor for medical advice about side effects. You may report side effects to FDA at 1-800-FDA-1088. Where should I keep my medicine? Keep out of the reach of children. Store at room temperature between 15 and 30 degrees C (59 and 86 degrees F). Throw away any unused medicine after the expiration date. NOTE: This sheet is a summary. It may not cover all possible information. If you have questions about this medicine, talk to your doctor, pharmacist, or health care provider.  2015, Elsevier/Gold Standard. (2013-06-12 14:20:31)

## 2014-06-17 NOTE — Progress Notes (Signed)
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 She returns today for follow-up. She was started on Cymbalta 30 mg twice a day. She reports that since she started the Cymbalta all of her sensory changes have resolved.  She states that things are going well since starting this medication. She does feel that her stress and anxiety over losing a friend has improved as well.    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 She returns today for follow-up. She continues to have the tingling on the left arm and face. She has had some tingling in the right hand and face but not as frequently as on the left side. She states that the tingling occurs daily but intermittent on the left side of the face and arm. She states that the numbness and tingling is not traveling up or down her arm. For the last month she has had pain in the back of her leg that occurs mostly at night. It does not get worse with walking. She states there is not a lesion or knot on the back of the knee. She had a good friend that died suddenly in March and she wonders if that may have something to do with all these new symptoms.    REVIEW OF SYSTEMS: Out of a complete 14 system review of symptoms, the patient complains only of the following symptoms, and all other reviewed systems are negative.  ALLERGIES: No Known Allergies  HOME MEDICATIONS: Outpatient Prescriptions Prior to Visit  Medication Sig Dispense Refill  . ALPRAZolam (XANAX) 0.25 MG tablet Take 0.25 mg by mouth at bedtime as needed for anxiety.    Marland Kitchen. aspirin EC 81 MG EC tablet Take 1 tablet (81 mg total) by mouth daily.    . DULoxetine (CYMBALTA) 30 MG capsule Take 1 capsule by  mouth for 1 week then increase to 1 tablet twice a day thereafter. 60 capsule 3  . estradiol (VIVELLE-DOT) 0.075 MG/24HR Place 1 patch onto the skin 2 (two) times a week. Monday and Thursday      . metoprolol succinate (TOPROL-XL) 50 MG 24 hr tablet Take 50 mg by mouth daily. Take with or immediately following a meal.    . zolpidem (AMBIEN) 10 MG tablet Take 10 mg by mouth at bedtime as needed for sleep.    Marland Kitchen. topiramate (TOPAMAX) 25 MG tablet Take 1 tablet (25 mg total) by mouth daily. 7 tablet 0   No facility-administered medications prior to visit.    PAST MEDICAL HISTORY: Past Medical History  Diagnosis Date  . Hypertension   . Hip pain, left   . Kidney stone     lithotripsy  . Disturbance of skin sensation 11/07/2013    PAST SURGICAL HISTORY: Past Surgical History  Procedure Laterality Date  . Abdominal hysterectomy    . Ovarian cyst removal    . Hip surgery Left     bursectomy  . Tonsillectomy      FAMILY HISTORY: Family History  Problem Relation Age of Onset  . Heart disease Mother   . Hypertension Mother   . Bladder Cancer Mother   . Stroke Mother   . Heart disease Father   . Hypertension Father   .  Stroke Father   . Hypertension Sister   . Hypertension Son   . Hypertension Brother   . Migraines Neg Hx   . Seizures Neg Hx   . Hypertension Sister   . Hypertension Sister   . Hypertension Brother     SOCIAL HISTORY: History   Social History  . Marital Status: Married    Spouse Name: Perlie Gold    Number of Children: 2  . Years of Education: 12   Occupational History  . Retired     Comptroller  . PT substitute teacher     [employer]   Social History Main Topics  . Smoking status: Never Smoker   . Smokeless tobacco: Never Used  . Alcohol Use: Yes     Comment: OCASIONAL  . Drug Use: No  . Sexual Activity: Not on file   Other Topics Concern  . Not on file   Social History Narrative   Patient lives at home with family.   Caffeine  Use: 1 soda daily      PHYSICAL EXAM  Filed Vitals:   06/17/14 1347  BP: 141/82  Pulse: 65  Temp: 97.4 F (36.3 C)  TempSrc: Oral  Height: 5' 5.5" (1.664 m)  Weight: 155 lb (70.308 kg)   Body mass index is 25.39 kg/(m^2).  Generalized: Well developed, in no acute distress   Neurological examination  Mentation: Alert oriented to time, place, history taking. Follows all commands speech and language fluent Cranial nerve II-XII: Pupils were equal round reactive to light. Extraocular movements were full, visual field were full on confrontational test. Facial sensation and strength were normal. Uvula tongue midline. Head turning and shoulder shrug  were normal and symmetric. Motor: The motor testing reveals 5 over 5 strength of all 4 extremities. Good symmetric motor tone is noted throughout.  Sensory: Sensory testing is intact to soft touch on all 4 extremities. No evidence of extinction is noted.  Coordination: Cerebellar testing reveals good finger-nose-finger and heel-to-shin bilaterally.  Gait and station: Gait is normal. Tandem gait is normal. Romberg is negative. No drift is seen.  Reflexes: Deep tendon reflexes are symmetric and normal bilaterally.    DIAGNOSTIC DATA (LABS, IMAGING, TESTING) - I reviewed patient records, labs, notes, testing and imaging myself where available.  Lab Results  Component Value Date   WBC 9.2 07/07/2013   HGB 12.8 07/07/2013   HCT 38.7 07/07/2013   MCV 87.6 07/07/2013   PLT 366 07/07/2013      Component Value Date/Time   NA 140 07/07/2013 0337   K 3.1* 07/07/2013 0337   CL 99 07/07/2013 0337   CO2 30 07/07/2013 0337   GLUCOSE 121* 07/07/2013 0337   BUN 17 07/07/2013 0337   CREATININE 0.65 07/07/2013 0941   CALCIUM 9.1 07/07/2013 0337   PROT 7.1 07/15/2011 1410   ALBUMIN 3.7 07/15/2011 1410   AST 23 07/15/2011 1410   ALT 16 01/01/2014 0739   ALKPHOS 63 07/15/2011 1410   BILITOT 0.6 07/15/2011 1410   GFRNONAA >90 07/07/2013 0941     GFRAA >90 07/07/2013 0941   Lab Results  Component Value Date   CHOL 216* 01/01/2014   HDL 50.00 01/01/2014   LDLCALC 138* 01/01/2014   TRIG 142.0 01/01/2014   CHOLHDL 4 01/01/2014   Lab Results  Component Value Date   HGBA1C 5.6 07/07/2013   Lab Results  Component Value Date   VITAMINB12 454 02/20/2014   No results found for: TSH    ASSESSMENT  AND PLAN 58 y.o. year old female  has a past medical history of Hypertension; Hip pain, left; Kidney stone; and Disturbance of skin sensation (11/07/2013). here with:  1. Disturbance of skin sensation  Overall the patient is doing much better. Her sensory changes have resolved with Cymbalta. She'll continue taking Cymbalta 30 mg twice a day. I will refill today. If her symptoms worsen or she develops new symptoms she should let us know.  Butch PennyMegan Yara Tomkinson, MSN, NP-C 06/17/2014, 2:15 PM [facility] [address], [address] [place], [place] [phone]  Note: This document was prepared with digital dictation and possible smart phrase technology. Any transcriptional errors that result from this process are unintentional.

## 2014-08-14 ENCOUNTER — Other Ambulatory Visit: Payer: Self-pay | Admitting: Adult Health

## 2014-08-27 IMAGING — CR DG CHEST 2V
2 series · 2 of 2 positions shown · non-contrast
Comparison: 07/15/2011

CLINICAL DATA: Chest pain and shortness of breath

EXAM:
CHEST  2 VIEW

[w chest pa]
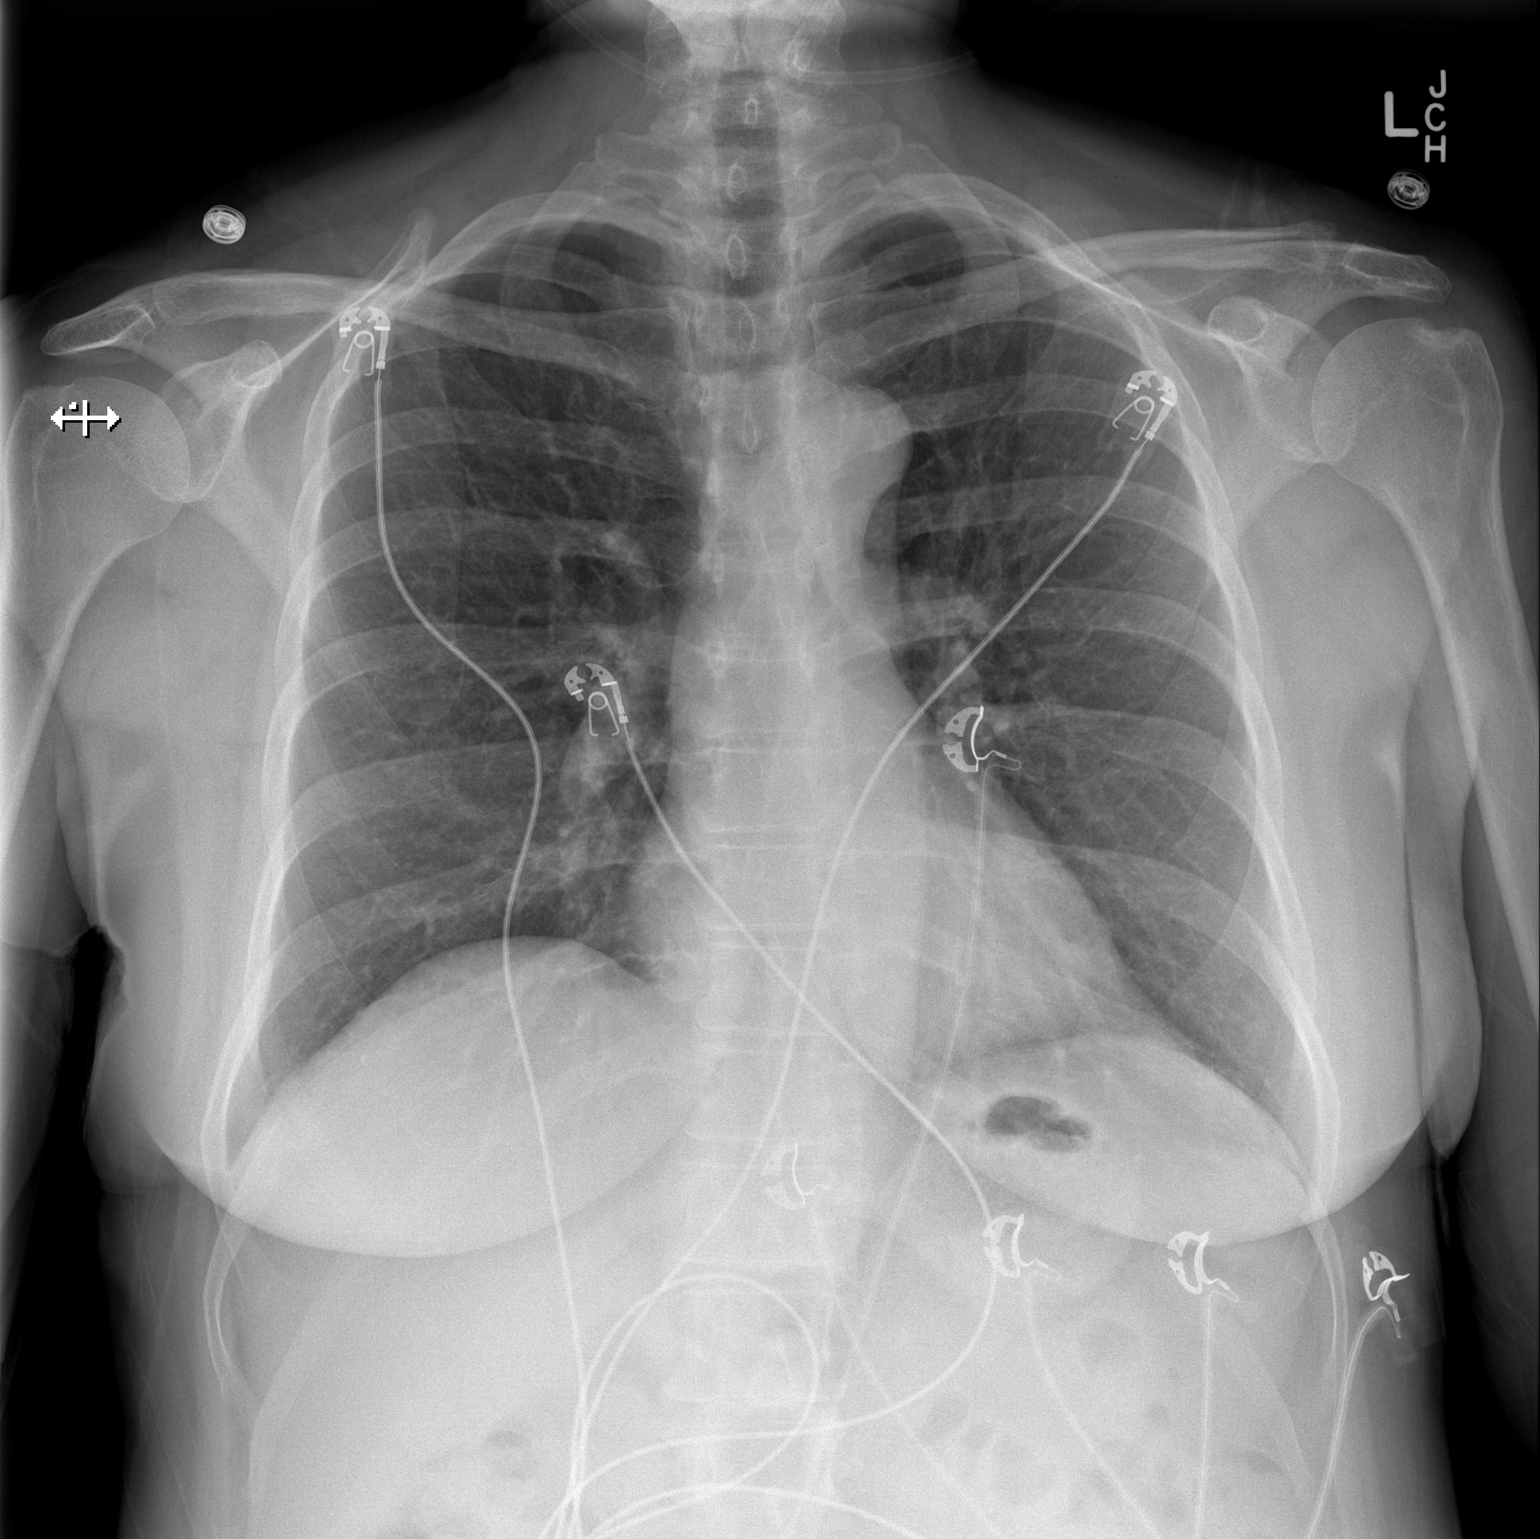

[w chest lat]
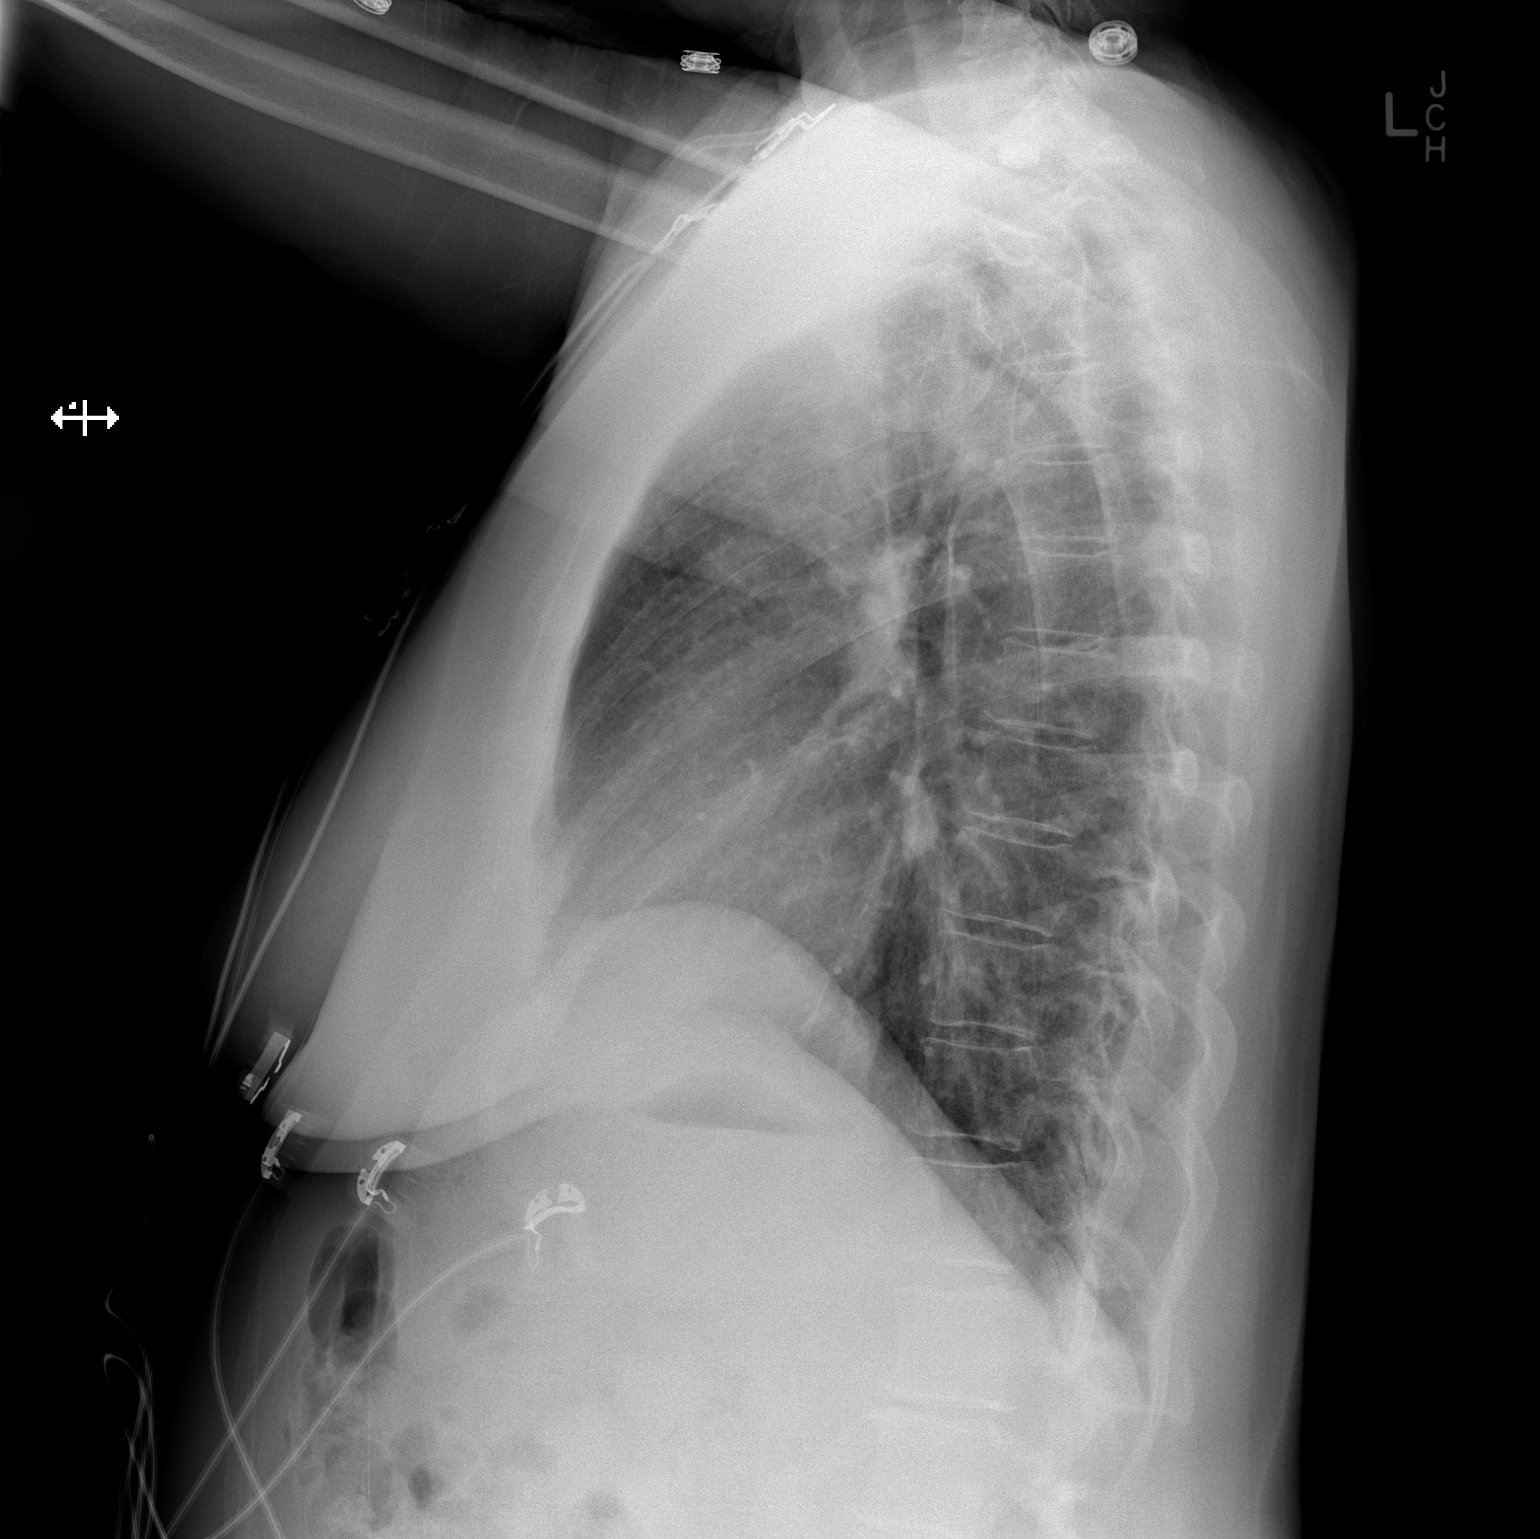

[2 of 2 positions shown; findings below may reference images not displayed]

FINDINGS: The heart size and mediastinal contours are within normal limits.
Both lungs are clear. The visualized skeletal structures are
unremarkable.
IMPRESSION: No active cardiopulmonary disease.

## 2014-12-31 ENCOUNTER — Encounter: Payer: Self-pay | Admitting: Adult Health

## 2014-12-31 ENCOUNTER — Ambulatory Visit (INDEPENDENT_AMBULATORY_CARE_PROVIDER_SITE_OTHER): Payer: Federal, State, Local not specified - PPO | Admitting: Adult Health

## 2014-12-31 VITALS — BP 134/79 | HR 66 | Ht 65.0 in | Wt 167.0 lb

## 2014-12-31 DIAGNOSIS — R209 Unspecified disturbances of skin sensation: Secondary | ICD-10-CM

## 2014-12-31 MED ORDER — DULOXETINE HCL 30 MG PO CPEP: 30.0000 mg | ORAL_CAPSULE | Freq: Two times a day (BID) | ORAL | Status: DC

## 2014-12-31 NOTE — Progress Notes (Signed)
PATIENT: Sheila Allen DOB: Jan 05, 1956  REASON FOR VISIT: follow up- left-sided sensory alterations HISTORY FROM: patient  HISTORY OF PRESENT ILLNESS: [name] is a 59 year old female with a history of left-sided sensory alterations. She returns today for follow-up. She continues to take Cymbalta 30 mg twice a day. She reports that since she started the Cymbalta she no longer has any of her symptoms. She states that this medication is working very well for her. She denies any new medical issues. She returns today for medication refill.  HISTOY 06/17/14: 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 She returns today for follow-up. She was started on Cymbalta 30 mg twice a day. She reports that since she started the Cymbalta all of her sensory changes have resolved. She states that things are going well since starting this medication. She does feel that her stress and anxiety over losing a friend has improved as well.   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 She returns today for follow-up. She continues to have the tingling on the left arm and face. She has had some tingling in the right hand and face but not as frequently as on the left side. She states that the tingling occurs daily but intermittent on the left side of the face and arm. She states that the numbness and tingling is not traveling up or down her arm. For the last month she has had pain in the back of her leg that occurs mostly at night. It does not get worse with walking. She states there is not a lesion or knot on the back of the knee. She had a good friend that died suddenly in March and she wonders if that may have something to do with all these new symptoms.     REVIEW OF SYSTEMS: Out of a complete 14 system review of symptoms, the patient  complains only of the following symptoms, and all other reviewed systems are negative.  See history of present illness  ALLERGIES: No Known Allergies  HOME MEDICATIONS: Outpatient Prescriptions Prior to Visit  Medication Sig Dispense Refill  . ALPRAZolam (XANAX) 0.25 MG tablet Take 0.25 mg by mouth at bedtime as needed for anxiety.    Marland Kitchen. aspirin EC 81 MG EC tablet Take 1 tablet (81 mg total) by mouth daily.    . DULoxetine (CYMBALTA) 30 MG capsule Take 1 capsule (30 mg total) by mouth 2 (two) times daily. 180 capsule 0  . estradiol (VIVELLE-DOT) 0.075 MG/24HR Place 1 patch onto the skin 2 (two) times a week. Monday and Thursday      . metoprolol succinate (TOPROL-XL) 50 MG 24 hr tablet Take 50 mg by mouth daily. Take with or immediately following a meal.    . zolpidem (AMBIEN) 10 MG tablet Take 10 mg by mouth at bedtime as needed for sleep.    . DULoxetine (CYMBALTA) 30 MG capsule Take 1 capsule (30 mg total) by mouth 2 (two) times daily. (Patient not taking: Reported on 12/31/2014) 60 capsule 5   No facility-administered medications prior to visit.    PAST MEDICAL HISTORY: Past Medical History  Diagnosis Date  . Hypertension   . Hip pain, left   . Kidney stone     lithotripsy  . Disturbance of skin sensation 11/07/2013    PAST  SURGICAL HISTORY: Past Surgical History  Procedure Laterality Date  . Abdominal hysterectomy    . Ovarian cyst removal    . Hip surgery Left     bursectomy  . Tonsillectomy      FAMILY HISTORY: Family History  Problem Relation Age of Onset  . Heart disease Mother   . Hypertension Mother   . Bladder Cancer Mother   . Stroke Mother   . Heart disease Father   . Hypertension Father   . Stroke Father   . Hypertension Sister   . Hypertension Son   . Hypertension Brother   . Migraines Neg Hx   . Seizures Neg Hx   . Hypertension Sister   . Hypertension Sister   . Hypertension Brother     SOCIAL HISTORY: History   Social History  . Marital  Status: Married    Spouse Name: Perlie Gold  . Number of Children: 2  . Years of Education: 12   Occupational History  . Retired     Comptroller  . PT substitute teacher     [employer]   Social History Main Topics  . Smoking status: Never Smoker   . Smokeless tobacco: Never Used  . Alcohol Use: 0.0 oz/week    0 Standard drinks or equivalent per week     Comment: OCASIONAL  . Drug Use: No  . Sexual Activity: Not on file   Other Topics Concern  . Not on file   Social History Narrative   Patient lives at home with family.. 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   Right handed.   Education 12 th grade.   Caffeine none          PHYSICAL EXAM  Filed Vitals:   12/31/14 0939  BP: 134/79  Pulse: 66  Height: 5\' 5"  (1.651 m)  Weight: 167 lb (75.751 kg)   Body mass index is 27.79 kg/(m^2).   Generalized: Well developed, in no acute distress   Neurological examination  Mentation: Alert oriented to time, place, history taking. Follows all commands speech and language fluent Cranial nerve II-XII: Pupils were equal round reactive to light. Extraocular movements were full, visual field were full on confrontational test. Facial sensation and strength were normal. Uvula tongue midline. Head turning and shoulder shrug  were normal and symmetric. Motor: The motor testing reveals 5 over 5 strength of all 4 extremities. Good symmetric motor tone is noted throughout.  Sensory: Sensory testing is intact to soft touch on all 4 extremities. No evidence of extinction is noted.  Coordination: Cerebellar testing reveals good finger-nose-finger and heel-to-shin bilaterally.  Gait and station: Gait is normal. Tandem gait is normal. Romberg is negative. No drift is seen.  Reflexes: Deep tendon reflexes are symmetric and normal bilaterally.    DIAGNOSTIC DATA (LABS, IMAGING, TESTING) - I reviewed patient records, labs, notes, testing and  imaging myself where available.   ASSESSMENT AND PLAN 59 y.o. year old female  has a past medical history of Hypertension; Hip pain, left; Kidney stone; and Disturbance of skin sensation (11/07/2013). here with:  1. Sensory alterations on the left side  Patient symptoms have resolved on Cymbalta. She will continue Cymbalta 30 mg twice a day. If her symptoms worsen or she develops new symptoms she should let us know.  follow-up in one year or sooner if needed.     Butch Penny, MSN, NP-C 12/31/2014, 9:55 AM [facility] [address], [address] [place], [place] [address] [phone]  Note: This document was prepared with digital dictation and possible smart phrase technology. Any transcriptional errors that result from this process are unintentional.

## 2014-12-31 NOTE — Patient Instructions (Signed)
Continue Cymbalta. Refill sent. If symptoms worsen please let us know.

## 2014-12-31 NOTE — Progress Notes (Signed)
I have read the note, and I agree with the clinical assessment and plan.  Tanveer Dobberstein KEITH   

## 2015-06-17 ENCOUNTER — Other Ambulatory Visit: Payer: Self-pay | Admitting: Obstetrics & Gynecology

## 2015-06-17 DIAGNOSIS — N6001 Solitary cyst of right breast: Secondary | ICD-10-CM

## 2015-06-23 ENCOUNTER — Ambulatory Visit
Admission: RE | Admit: 2015-06-23 | Discharge: 2015-06-23 | Disposition: A | Discharge status: Home / Self Care | Payer: Federal, State, Local not specified - PPO | Source: Ambulatory Visit | Attending: Obstetrics & Gynecology | Admitting: Obstetrics & Gynecology

## 2015-06-23 DIAGNOSIS — N6001 Solitary cyst of right breast: Secondary | ICD-10-CM

## 2015-06-24 ENCOUNTER — Other Ambulatory Visit: Payer: Federal, State, Local not specified - PPO

## 2015-08-29 IMAGING — MG MM DIAG BREAST TOMO BILATERAL
8 series · 8 of 24 positions shown · non-contrast
Comparison: With priors.

CLINICAL DATA: Short-term interval followup of probable benign cyst
in the right breast.

EXAM:
DIGITAL DIAGNOSTIC BILATERAL MAMMOGRAM WITH 3D TOMOSYNTHESIS WITH
CAD
ULTRASOUND RIGHT BREAST

[R CC]
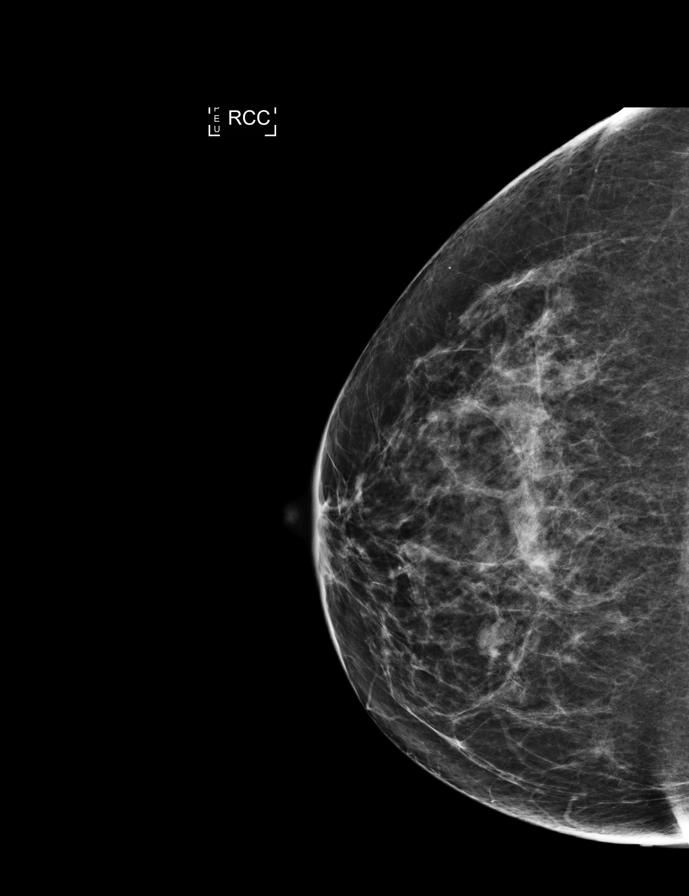

[L CC]
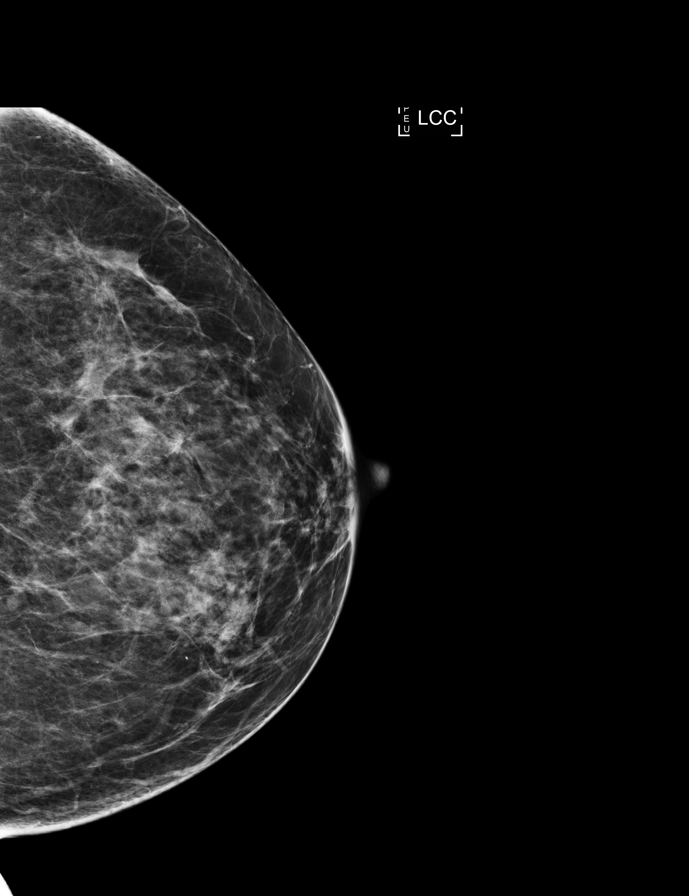

[R MLO]
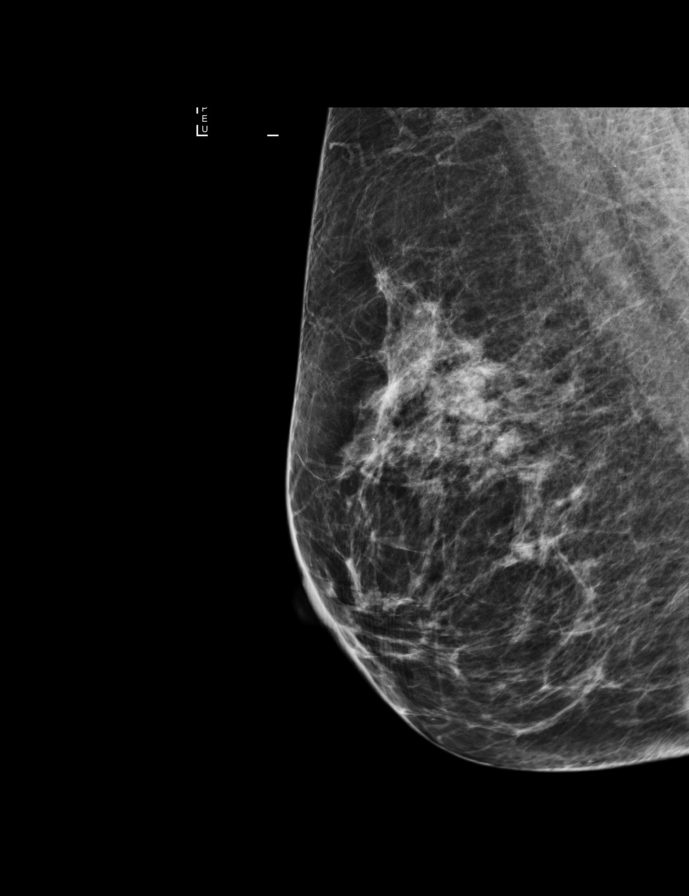

[L MLO]
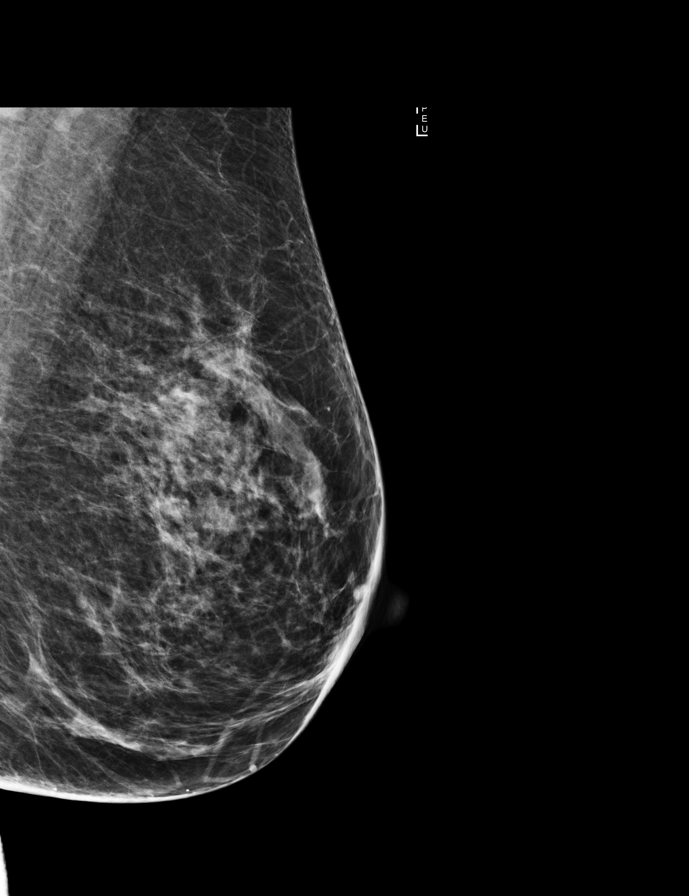

[L MLO tomo · tomo slice 35/69.0]
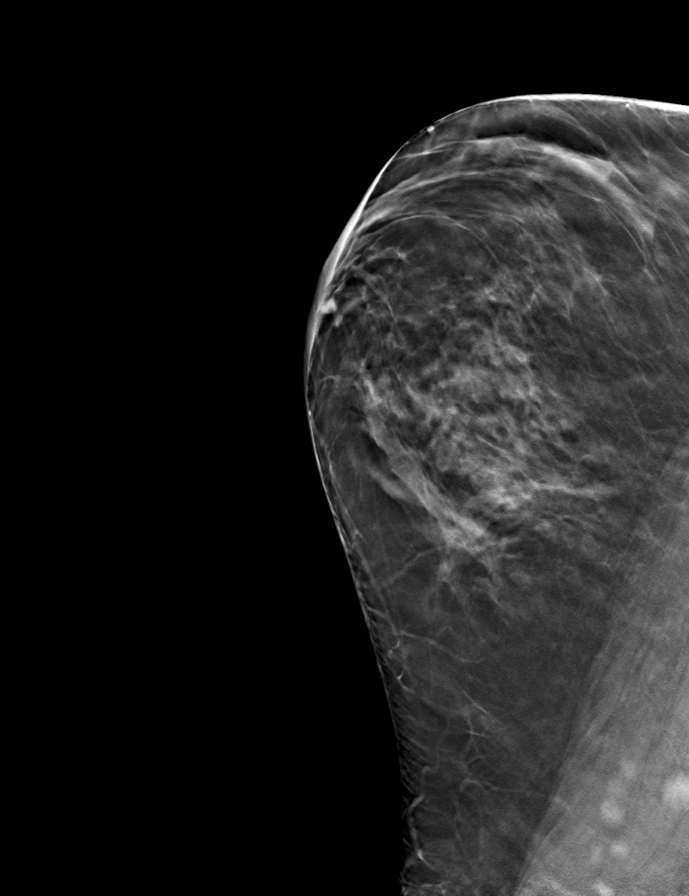

[R CC tomo · tomo slice 35/69.0]
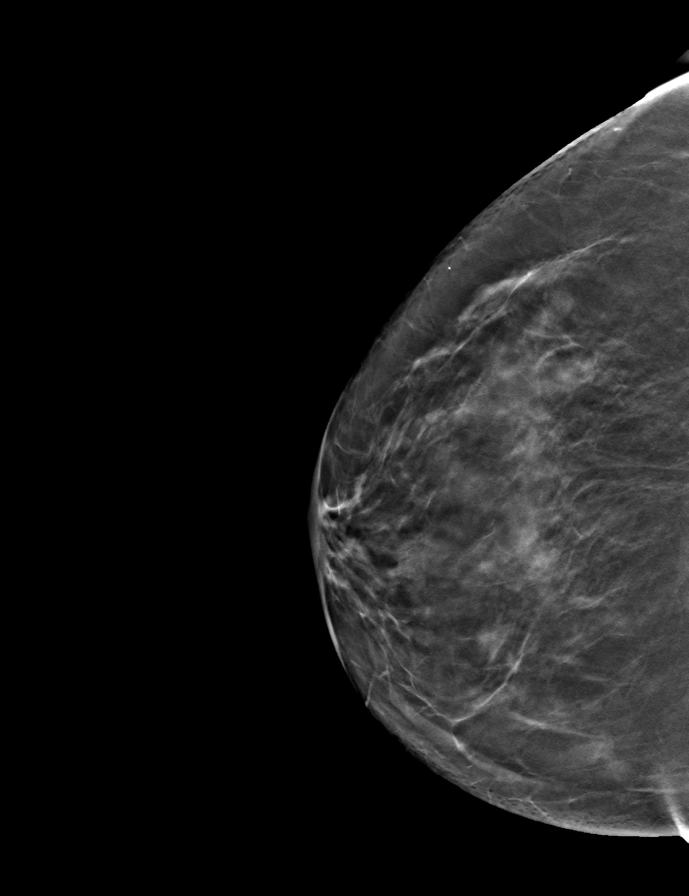

[R MLO tomo · tomo slice 35/70.0]
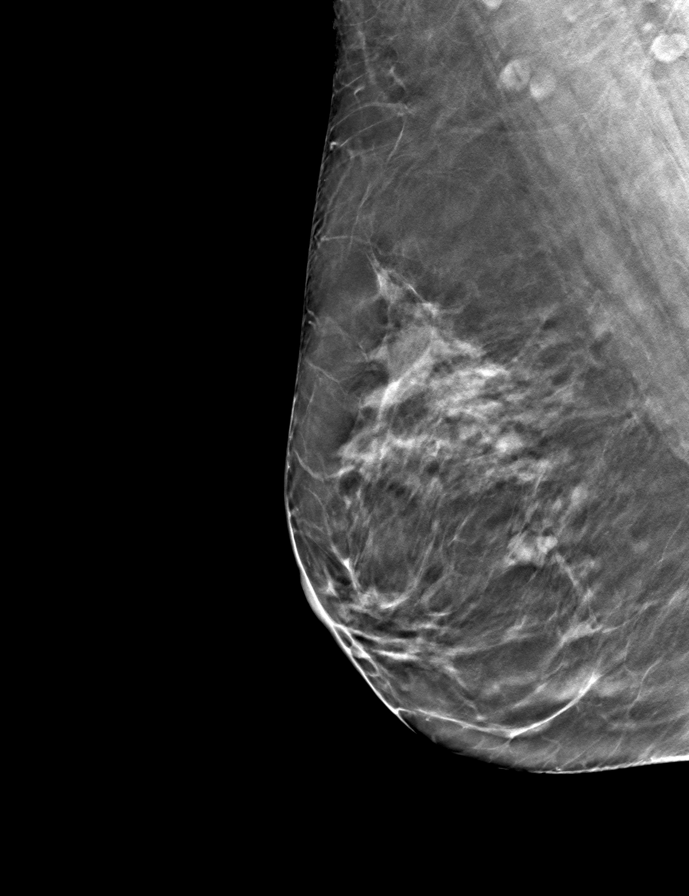

[L CC tomo · tomo slice 32/63.0]
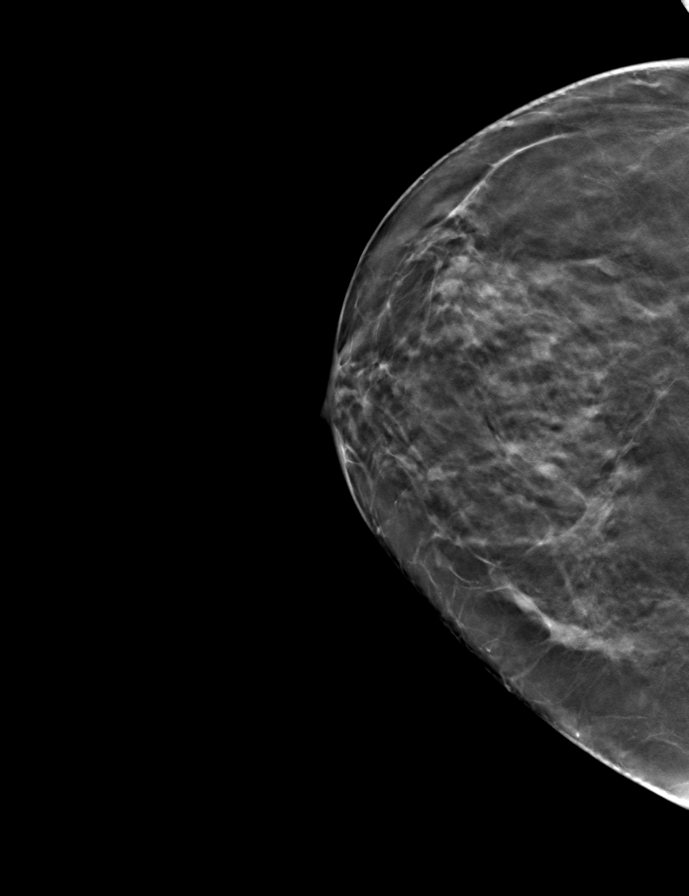

[8 of 24 positions shown; findings below may reference images not displayed]

ACR Breast Density Category b: There are scattered areas of
fibroglandular density.
FINDINGS: There is no interval change in the parenchymal pattern of both
breast. The 7 mm low-density nodule in the medial right breast is
stable. No new suspicious mass or malignant type microcalcifications
identified.

Mammographic images were processed with CAD.

On physical exam, I do not palpate a mass in the right breast.

Ultrasound is performed, showing, there is probable stable clustered
sub cm anechoic cysts (apocrine metaplasia) in the right breast at 3
o'clock 4 cm from the nipple measuring 6 x 3 x 4 mm. On the prior
ultrasound dated 12/19/2013 it measured 9 x 4 x 8 mm..
IMPRESSION: Probable benign clustered cysts in the right breast. There is been
no interval change from the prior exam dated 12/19/2013.

RECOMMENDATION:
Bilateral diagnostic mammogram and right breast ultrasound in 1 year
is recommended to document 2 year stability

I have discussed the findings and recommendations with the patient.
Results were also provided in writing at the conclusion of the
visit. If applicable, a reminder letter will be sent to the patient
regarding the next appointment.

BI-RADS CATEGORY  3: Probably benign.

## 2015-11-17 ENCOUNTER — Other Ambulatory Visit: Payer: Self-pay | Admitting: Obstetrics & Gynecology

## 2015-11-17 DIAGNOSIS — N6001 Solitary cyst of right breast: Secondary | ICD-10-CM

## 2015-12-23 ENCOUNTER — Ambulatory Visit
Admission: RE | Admit: 2015-12-23 | Discharge: 2015-12-23 | Disposition: A | Discharge status: Home / Self Care | Payer: Federal, State, Local not specified - PPO | Source: Ambulatory Visit | Attending: Obstetrics & Gynecology | Admitting: Obstetrics & Gynecology

## 2015-12-23 DIAGNOSIS — N6001 Solitary cyst of right breast: Secondary | ICD-10-CM

## 2015-12-23 DIAGNOSIS — N63 Unspecified lump in breast: Secondary | ICD-10-CM | POA: Diagnosis not present

## 2015-12-31 ENCOUNTER — Encounter: Payer: Self-pay | Admitting: Adult Health

## 2015-12-31 ENCOUNTER — Ambulatory Visit (INDEPENDENT_AMBULATORY_CARE_PROVIDER_SITE_OTHER): Payer: Federal, State, Local not specified - PPO | Admitting: Adult Health

## 2015-12-31 VITALS — BP 131/80 | HR 67 | Ht 65.0 in | Wt 171.0 lb

## 2015-12-31 DIAGNOSIS — R209 Unspecified disturbances of skin sensation: Secondary | ICD-10-CM | POA: Diagnosis not present

## 2015-12-31 MED ORDER — DULOXETINE HCL 30 MG PO CPEP: 30.0000 mg | ORAL_CAPSULE | Freq: Two times a day (BID) | ORAL | Status: DC

## 2015-12-31 NOTE — Progress Notes (Signed)
PATIENT: Sheila Allen DOB: [birth year]  REASON FOR VISIT: follow up- skin sensation disturbance HISTORY FROM: patient  HISTORY OF PRESENT ILLNESS:   Sheila Allen is a 60 year old female with a history of left-sided sensory alterations. She returns today for follow-up. She states that since she started on Cymbalta she is no longer had the symptoms. She is very pleased with Cymbalta. She denies any new neurological symptoms. She states that she does had diverticulitis and occasionally will have a flareup. Typically antibiotics is able to treat this successfully. She returns today for an evaluation.   HISTORY 12/31/14 (MM): Sheila Allen is a 60 year old female with a history of left-sided sensory alterations. She returns today for follow-up. She continues to take Cymbalta 30 mg twice a day. She reports that since she started the Cymbalta she no longer has any of her symptoms. She states that this medication is working very well for her. She denies any new medical issues. She returns today for medication refill.  HISTOY 06/17/14: Sheila Allen is a 60 year old female with a history of left-sided sensory alterations that has evolved to the right side and neck pain. She returns today for follow-up. She was started on Cymbalta 30 mg twice a day. She reports that since she started the Cymbalta all of her sensory changes have resolved. She states that things are going well since starting this medication. She does feel that her stress and anxiety over losing a friend has improved as well.   HISTORY 04/10/14: 60 year old female with a history of left-sided sensory alterations that has evolved to the right side and neck pain. She returns today for follow-up. She continues to have the tingling on the left arm and face. She has had some tingling in the right hand and face but not as frequently as on the left side. She states that the tingling occurs daily but intermittent on the left side of the face and arm. She  states that the numbness and tingling is not traveling up or down her arm. For the last month she has had pain in the back of her leg that occurs mostly at night. It does not get worse with walking. She states there is not a lesion or knot on the back of the knee. She had a good friend that died suddenly in 09/29/2022 and she wonders if that may have something to do with all these new symptoms.   9  REVIEW OF SYSTEMS: Out of a complete 14 system review of symptoms, the patient complains only of the following symptoms, and all other reviewed systems are negative.  Depression, nervous/anxious  ALLERGIES: No Known Allergies  HOME MEDICATIONS: Outpatient Prescriptions Prior to Visit  Medication Sig Dispense Refill  . ALPRAZolam (XANAX) 0.25 MG tablet Take 0.25 mg by mouth at bedtime as needed for anxiety.    Marland Kitchen aspirin EC 81 MG EC tablet Take 1 tablet (81 mg total) by mouth daily.    . DULoxetine (CYMBALTA) 30 MG capsule Take 1 capsule (30 mg total) by mouth 2 (two) times daily. 180 capsule 3  . estradiol (VIVELLE-DOT) 0.075 MG/24HR Place 1 patch onto the skin 2 (two) times a week. Monday and Thursday      . metoprolol succinate (TOPROL-XL) 50 MG 24 hr tablet Take 50 mg by mouth daily. Take with or immediately following a meal.    . zolpidem (AMBIEN) 10 MG tablet Take 10 mg by mouth at bedtime as needed for sleep.  No facility-administered medications prior to visit.    PAST MEDICAL HISTORY: Past Medical History  Diagnosis Date  . Hypertension   . Hip pain, left   . Kidney stone     lithotripsy  . Disturbance of skin sensation 11/07/2013    PAST SURGICAL HISTORY: Past Surgical History  Procedure Laterality Date  . Abdominal hysterectomy    . Ovarian cyst removal    . Hip surgery Left     bursectomy  . Tonsillectomy      FAMILY HISTORY: Family History  Problem Relation Age of Onset  . Heart disease Mother   . Hypertension Mother   . Bladder Cancer Mother   . Stroke Mother     . Heart disease Father   . Hypertension Father   . Stroke Father   . Hypertension Sister   . Hypertension Son   . Hypertension Brother   . Migraines Neg Hx   . Seizures Neg Hx   . Hypertension Sister   . Hypertension Sister   . Hypertension Brother     SOCIAL HISTORY: Social History   Social History  . Marital Status: Married    Spouse Name: Perlie GoldRussell  . Number of Children: 2  . Years of Education: 12   Occupational History  . Retired     [employer]  . PT substitute teacher     [employer]   Social History Main Topics  . Smoking status: Never Smoker   . Smokeless tobacco: Never Used  . Alcohol Use: 0.0 oz/week    0 Standard drinks or equivalent per week     Comment: OCASIONAL  . Drug Use: No  . Sexual Activity: Not on file   Other Topics Concern  . Not on file   Social History Narrative   Patient lives at home with family.. Patient is married [name]([place])   Retired post [employer]. Part time [employer].   Right handed.   Education 12 th grade.   Caffeine none          PHYSICAL EXAM  Filed Vitals:   12/31/15 0923  BP: 131/80  Pulse: 67  Height: 5\' 5"  (1.651 m)  Weight: 171 lb (77.565 kg)   Body mass index is 28.46 kg/(m^2).  Generalized: Well developed, in no acute distress   Neurological examination  Mentation: Alert oriented to time, place, history taking. Follows all commands speech and language fluent Cranial nerve II-XII: Pupils were equal round reactive to light. Extraocular movements were full, visual field were full on confrontational test. Facial sensation and strength were normal. Uvula tongue midline. Head turning and shoulder shrug  were normal and symmetric. Motor: The motor testing reveals 5 over 5 strength of all 4 extremities. Good symmetric motor tone is noted throughout.  Sensory: Sensory testing is intact to soft touch on all 4 extremities. No evidence of extinction is noted.  Coordination: Cerebellar testing  reveals good finger-nose-finger and heel-to-shin bilaterally.  Gait and station: Gait is normal. Tandem gait is normal. Romberg is negative. No drift is seen.  Reflexes: Deep tendon reflexes are symmetric and normal bilaterally.   DIAGNOSTIC DATA (LABS, IMAGING, TESTING) - I reviewed patient records, labs, notes, testing and imaging myself where available.      ASSESSMENT AND PLAN 60 y.o. year old female  has a past medical history of Hypertension; Hip pain, left; Kidney stone; and Disturbance of skin sensation (11/07/2013). here with:  1. Disturbance of skin sensation  Overall the patient is doing well. She  will continue on Cymbalta. I will send in a refill today. Patient advised that if her symptoms worsen or she develops any she will follow-up in 1 year or sooner if needed     Butch PennyMegan Virgen Belland, MSN, NP-C 12/31/2015, 9:41 AM [facility] [address], [address] [place], [place] [id number] [phone]

## 2015-12-31 NOTE — Progress Notes (Signed)
I have read the note, and I agree with the clinical assessment and plan.  Nelton Amsden KEITH   

## 2015-12-31 NOTE — Patient Instructions (Signed)
Continue Cymbalta If your symptoms worsen or you develop new symptoms please let us know.   

## 2016-01-01 DIAGNOSIS — K5732 Diverticulitis of large intestine without perforation or abscess without bleeding: Secondary | ICD-10-CM | POA: Diagnosis not present

## 2016-01-03 ENCOUNTER — Other Ambulatory Visit: Payer: Self-pay | Admitting: Adult Health

## 2016-01-19 DIAGNOSIS — Z0000 Encounter for general adult medical examination without abnormal findings: Secondary | ICD-10-CM | POA: Diagnosis not present

## 2016-01-19 DIAGNOSIS — E785 Hyperlipidemia, unspecified: Secondary | ICD-10-CM | POA: Diagnosis not present

## 2016-01-19 DIAGNOSIS — E559 Vitamin D deficiency, unspecified: Secondary | ICD-10-CM | POA: Diagnosis not present

## 2016-06-16 DIAGNOSIS — Z1211 Encounter for screening for malignant neoplasm of colon: Secondary | ICD-10-CM | POA: Diagnosis not present

## 2016-06-16 DIAGNOSIS — K5730 Diverticulosis of large intestine without perforation or abscess without bleeding: Secondary | ICD-10-CM | POA: Diagnosis not present

## 2016-07-15 DIAGNOSIS — Z6828 Body mass index (BMI) 28.0-28.9, adult: Secondary | ICD-10-CM | POA: Diagnosis not present

## 2016-07-15 DIAGNOSIS — Z01419 Encounter for gynecological examination (general) (routine) without abnormal findings: Secondary | ICD-10-CM | POA: Diagnosis not present

## 2016-07-15 DIAGNOSIS — Z1231 Encounter for screening mammogram for malignant neoplasm of breast: Secondary | ICD-10-CM | POA: Diagnosis not present

## 2016-07-28 DIAGNOSIS — R319 Hematuria, unspecified: Secondary | ICD-10-CM | POA: Diagnosis not present

## 2016-07-30 DIAGNOSIS — R1111 Vomiting without nausea: Secondary | ICD-10-CM | POA: Diagnosis not present

## 2016-07-30 DIAGNOSIS — R3129 Other microscopic hematuria: Secondary | ICD-10-CM | POA: Diagnosis not present

## 2016-07-30 DIAGNOSIS — N200 Calculus of kidney: Secondary | ICD-10-CM | POA: Diagnosis not present

## 2016-08-19 DIAGNOSIS — R8271 Bacteriuria: Secondary | ICD-10-CM | POA: Diagnosis not present

## 2016-08-19 DIAGNOSIS — N200 Calculus of kidney: Secondary | ICD-10-CM | POA: Diagnosis not present

## 2016-08-19 DIAGNOSIS — R3129 Other microscopic hematuria: Secondary | ICD-10-CM | POA: Diagnosis not present

## 2016-09-16 DIAGNOSIS — N281 Cyst of kidney, acquired: Secondary | ICD-10-CM | POA: Diagnosis not present

## 2016-09-16 DIAGNOSIS — N200 Calculus of kidney: Secondary | ICD-10-CM | POA: Diagnosis not present

## 2017-01-12 ENCOUNTER — Ambulatory Visit (INDEPENDENT_AMBULATORY_CARE_PROVIDER_SITE_OTHER): Payer: Federal, State, Local not specified - PPO | Admitting: Adult Health

## 2017-01-12 ENCOUNTER — Encounter: Payer: Self-pay | Admitting: Adult Health

## 2017-01-12 VITALS — BP 131/80 | HR 75 | Wt 178.8 lb

## 2017-01-12 DIAGNOSIS — R209 Unspecified disturbances of skin sensation: Secondary | ICD-10-CM

## 2017-01-12 MED ORDER — DULOXETINE HCL 30 MG PO CPEP: 30.0000 mg | ORAL_CAPSULE | Freq: Two times a day (BID) | ORAL | 3 refills | Status: DC

## 2017-01-12 NOTE — Patient Instructions (Signed)
Your Plan:  Continue Cymbalta PCP can take over prescription if willing If your symptoms worsen or you develop new symptoms please let us know.   Thank you for coming to see us at [facility]. I hope we have been able to provide you high quality care today.  You may receive a patient satisfaction survey over the next few weeks. We would appreciate your feedback and comments so that we may continue to improve ourselves and the health of our patients.

## 2017-01-12 NOTE — Progress Notes (Signed)
PATIENT: Sheila Allen DOB: [birth year]  REASON FOR VISIT: follow up- left-sided sensory changes HISTORY FROM: patient  HISTORY OF PRESENT ILLNESS: Today 01/12/17 [name] is a 61 year old female with a history of left-sided sensory changes. She returns today for follow-up. She reports that she has remained on Cymbalta and tolerating it well. She has not had recurrence of symptoms. She states that she overall is doing well. She denies any new neurological symptoms. Denies any new medical issues. She returns today for an evaluation.   HISTORY 12/31/15: [name] is a 62 year old female with a history of left-sided sensory alterations. She returns today for follow-up. She states that since she started on Cymbalta she is no longer had the symptoms. She is very pleased with Cymbalta. She denies any new neurological symptoms. She states that she does had diverticulitis and occasionally will have a flareup. Typically antibiotics is able to treat this successfully. She returns today for an evaluation.   HISTORY 12/31/14 (MM): [name] is a 61 year old female with a history of left-sided sensory alterations. She returns today for follow-up. She continues to take Cymbalta 30 mg twice a day. She reports that since she started the Cymbalta she no longer has any of her symptoms. She states that this medication is working very well for her. She denies any new medical issues. She returns today for medication refill.  HISTOY 06/17/14: [name] is a 61 year old female with a history of left-sided sensory alterations that has evolved to the right side and neck pain. She returns today for follow-up. She was started on Cymbalta 30 mg twice a day. She reports that since she started the Cymbalta all of her sensory changes have resolved. She states that things are going well since starting this medication. She does feel that her stress and anxiety over losing a friend has improved as well.   HISTORY  04/10/14: 61 year old female with a history of left-sided sensory alterations that has evolved to the right side and neck pain. She returns today for follow-up. She continues to have the tingling on the left arm and face. She has had some tingling in the right hand and face but not as frequently as on the left side. She states that the tingling occurs daily but intermittent on the left side of the face and arm. She states that the numbness and tingling is not traveling up or down her arm. For the last month she has had pain in the back of her leg that occurs mostly at night. It does not get worse with walking. She states there is not a lesion or knot on the back of the knee. She had a good friend that died suddenly in 10-05-22 and she wonders if that may have something to do with all these new symptoms.   9 // REVIEW OF SYSTEMS: Out of a complete 14 system review of symptoms, the patient complains only of the following symptoms, and all other reviewed systems are negative.  Depression nervous/anxious  ALLERGIES: No Known Allergies  HOME MEDICATIONS: Outpatient Medications Prior to Visit  Medication Sig Dispense Refill  . ALPRAZolam (XANAX) 0.25 MG tablet Take 0.25 mg by mouth at bedtime as needed for anxiety.    Marland Kitchen aspirin EC 81 MG EC tablet Take 1 tablet (81 mg total) by mouth daily.    Marland Kitchen atorvastatin (LIPITOR) 20 MG tablet TK 1 T PO QD  5  . DULoxetine (CYMBALTA) 30 MG capsule Take 1 capsule (30 mg total)  by mouth 2 (two) times daily. 180 capsule 3  . estradiol (VIVELLE-DOT) 0.075 MG/24HR Place 1 patch onto the skin 2 (two) times a week. Monday and Thursday      . metoprolol succinate (TOPROL-XL) 50 MG 24 hr tablet Take 50 mg by mouth daily. Take with or immediately following a meal.    . zolpidem (AMBIEN) 10 MG tablet Take 10 mg by mouth at bedtime as needed for sleep.     No facility-administered medications prior to visit.     PAST MEDICAL HISTORY: Past Medical History:  Diagnosis Date    . Disturbance of skin sensation 11/07/2013  . Hip pain, left   . Hypertension   . Kidney stone    lithotripsy    PAST SURGICAL HISTORY: Past Surgical History:  Procedure Laterality Date  . ABDOMINAL HYSTERECTOMY    . HIP SURGERY Left    bursectomy  . OVARIAN CYST REMOVAL    . TONSILLECTOMY      FAMILY HISTORY: Family History  Problem Relation Age of Onset  . Heart disease Mother   . Hypertension Mother   . Bladder Cancer Mother   . Stroke Mother   . Heart disease Father   . Hypertension Father   . Stroke Father   . Hypertension Sister   . Hypertension Son   . Hypertension Brother   . Hypertension Sister   . Hypertension Sister   . Hypertension Brother   . Migraines Neg Hx   . Seizures Neg Hx     SOCIAL HISTORY: Social History   Social History  . Marital status: Married    Spouse name: Sheila Allen  . Number of children: 2  . Years of education: 12   Occupational History  . Retired     [employer]  . PT substitute teacher     [employer]   Social History Main Topics  . Smoking status: Never Smoker  . Smokeless tobacco: Never Used  . Alcohol use 0.0 oz/week     Comment: OCASIONAL  . Drug use: No  . Sexual activity: Not on file   Other Topics Concern  . Not on file   Social History Narrative   Patient lives at home with family.. Patient is married Sheila Fus([place])   Retired [employer]. Part time [employer].   Right handed.   Education 12 th grade.   Caffeine none          PHYSICAL EXAM  Vitals:   01/12/17 0918  BP: 131/80  Pulse: 75  Weight: 178 lb 12.8 oz (81.1 kg)   Body mass index is 29.75 kg/m.  Generalized: Well developed, in no acute distress   Neurological examination  Mentation: Alert oriented to time, place, history taking. Follows all commands speech and language fluent Cranial nerve II-XII: Pupils were equal round reactive to light. Extraocular movements were full, visual field were full on confrontational  test. Facial sensation and strength were normal. Uvula tongue midline. Head turning and shoulder shrug  were normal and symmetric. Motor: The motor testing reveals 5 over 5 strength of all 4 extremities. Good symmetric motor tone is noted throughout.  Sensory: Sensory testing is intact to soft touch on all 4 extremities. No evidence of extinction is noted.  Coordination: Cerebellar testing reveals good finger-nose-finger and heel-to-shin bilaterally.  Gait and station: Gait is normal. Tandem gait is normal. Romberg is negative. No drift is seen.  Reflexes: Deep tendon reflexes are symmetric and normal bilaterally.   DIAGNOSTIC DATA (LABS,  IMAGING, TESTING) - I reviewed patient records, labs, notes, testing and imaging myself where available.  Lab Results  Component Value Date   WBC 9.2 07/07/2013   HGB 12.8 07/07/2013   HCT 38.7 07/07/2013   MCV 87.6 07/07/2013   PLT 366 07/07/2013      Component Value Date/Time   NA 140 07/07/2013 0337   K 3.1 (L) 07/07/2013 0337   CL 99 07/07/2013 0337   CO2 30 07/07/2013 0337   GLUCOSE 121 (H) 07/07/2013 0337   BUN 17 07/07/2013 0337   CREATININE 0.65 07/07/2013 0941   CALCIUM 9.1 07/07/2013 0337   PROT 7.1 07/15/2011 1410   ALBUMIN 3.7 07/15/2011 1410   AST 23 07/15/2011 1410   ALT 16 01/01/2014 0739   ALKPHOS 63 07/15/2011 1410   BILITOT 0.6 07/15/2011 1410   GFRNONAA >90 07/07/2013 0941   GFRAA >90 07/07/2013 0941   Lab Results  Component Value Date   CHOL 216 (H) 01/01/2014   HDL 50.00 01/01/2014   LDLCALC 138 (H) 01/01/2014   TRIG 142.0 01/01/2014   CHOLHDL 4 01/01/2014   Lab Results  Component Value Date   HGBA1C 5.6 07/07/2013   Lab Results  Component Value Date   VITAMINB12 454 02/20/2014   No results found for: TSH    ASSESSMENT AND PLAN 61 y.o. year old female  has a past medical history of Disturbance of skin sensation (11/07/2013); Hip pain, left; Hypertension; and Kidney stone. here with:  1. Disturbance of  skin sensation  Overall the patient has done well. She will continue on Cymbalta. I advised that if her primary care is willing they can take over this prescription. The patient voiced understanding. She will follow up with our office on an as-needed basis.   Butch Penny, MSN, NP-C 01/12/2017, 9:37 AM [facility] [address] [place], [place] [address] [phone]

## 2017-01-12 NOTE — Progress Notes (Signed)
I have read the note, and I agree with the clinical assessment and plan.  Mckay Brandt KEITH   

## 2017-01-26 ENCOUNTER — Other Ambulatory Visit: Payer: Self-pay | Admitting: Adult Health

## 2017-03-09 DIAGNOSIS — K5732 Diverticulitis of large intestine without perforation or abscess without bleeding: Secondary | ICD-10-CM | POA: Diagnosis not present

## 2017-05-03 DIAGNOSIS — E559 Vitamin D deficiency, unspecified: Secondary | ICD-10-CM | POA: Diagnosis not present

## 2017-05-03 DIAGNOSIS — Z0000 Encounter for general adult medical examination without abnormal findings: Secondary | ICD-10-CM | POA: Diagnosis not present

## 2017-05-03 DIAGNOSIS — E785 Hyperlipidemia, unspecified: Secondary | ICD-10-CM | POA: Diagnosis not present

## 2017-05-03 DIAGNOSIS — Z23 Encounter for immunization: Secondary | ICD-10-CM | POA: Diagnosis not present

## 2017-07-20 DIAGNOSIS — Z1231 Encounter for screening mammogram for malignant neoplasm of breast: Secondary | ICD-10-CM | POA: Diagnosis not present

## 2017-07-20 DIAGNOSIS — Z01419 Encounter for gynecological examination (general) (routine) without abnormal findings: Secondary | ICD-10-CM | POA: Diagnosis not present

## 2017-07-20 DIAGNOSIS — Z6829 Body mass index (BMI) 29.0-29.9, adult: Secondary | ICD-10-CM | POA: Diagnosis not present

## 2017-10-24 DIAGNOSIS — N289 Disorder of kidney and ureter, unspecified: Secondary | ICD-10-CM | POA: Diagnosis not present

## 2017-10-24 DIAGNOSIS — N2889 Other specified disorders of kidney and ureter: Secondary | ICD-10-CM | POA: Diagnosis not present

## 2017-10-24 DIAGNOSIS — R1084 Generalized abdominal pain: Secondary | ICD-10-CM | POA: Diagnosis not present

## 2017-10-24 DIAGNOSIS — I10 Essential (primary) hypertension: Secondary | ICD-10-CM | POA: Diagnosis not present

## 2017-10-24 DIAGNOSIS — N200 Calculus of kidney: Secondary | ICD-10-CM | POA: Diagnosis not present

## 2017-10-24 DIAGNOSIS — R1032 Left lower quadrant pain: Secondary | ICD-10-CM | POA: Diagnosis not present

## 2017-10-24 DIAGNOSIS — K8020 Calculus of gallbladder without cholecystitis without obstruction: Secondary | ICD-10-CM | POA: Diagnosis not present

## 2017-11-01 DIAGNOSIS — E785 Hyperlipidemia, unspecified: Secondary | ICD-10-CM | POA: Diagnosis not present

## 2017-11-01 DIAGNOSIS — R739 Hyperglycemia, unspecified: Secondary | ICD-10-CM | POA: Diagnosis not present

## 2017-11-01 DIAGNOSIS — R1013 Epigastric pain: Secondary | ICD-10-CM | POA: Diagnosis not present

## 2017-11-01 DIAGNOSIS — E559 Vitamin D deficiency, unspecified: Secondary | ICD-10-CM | POA: Diagnosis not present

## 2017-11-01 DIAGNOSIS — I10 Essential (primary) hypertension: Secondary | ICD-10-CM | POA: Diagnosis not present

## 2017-11-08 DIAGNOSIS — N200 Calculus of kidney: Secondary | ICD-10-CM | POA: Diagnosis not present

## 2017-11-08 DIAGNOSIS — N281 Cyst of kidney, acquired: Secondary | ICD-10-CM | POA: Diagnosis not present

## 2017-11-18 DIAGNOSIS — K8010 Calculus of gallbladder with chronic cholecystitis without obstruction: Secondary | ICD-10-CM | POA: Diagnosis not present

## 2017-11-29 DIAGNOSIS — K811 Chronic cholecystitis: Secondary | ICD-10-CM | POA: Diagnosis not present

## 2017-11-29 DIAGNOSIS — K8010 Calculus of gallbladder with chronic cholecystitis without obstruction: Secondary | ICD-10-CM | POA: Diagnosis not present

## 2017-12-03 HISTORY — PX: CHOLECYSTECTOMY: SHX55

## 2017-12-13 DIAGNOSIS — N202 Calculus of kidney with calculus of ureter: Secondary | ICD-10-CM | POA: Diagnosis not present

## 2017-12-20 DIAGNOSIS — N202 Calculus of kidney with calculus of ureter: Secondary | ICD-10-CM | POA: Diagnosis not present

## 2017-12-20 DIAGNOSIS — N200 Calculus of kidney: Secondary | ICD-10-CM | POA: Diagnosis not present

## 2017-12-27 ENCOUNTER — Encounter (HOSPITAL_COMMUNITY): Payer: Self-pay | Admitting: *Deleted

## 2017-12-27 ENCOUNTER — Other Ambulatory Visit: Payer: Self-pay | Admitting: Urology

## 2017-12-28 ENCOUNTER — Other Ambulatory Visit: Payer: Self-pay | Admitting: Urology

## 2017-12-30 NOTE — H&P (Signed)
H&P  Chief Complaint: kidney stone  History of Present Illness: Sheila Allen is a 62 y.o. year old female who presents for management of a symptomatic right upper ureteral stone with shockwave lithotripsy.  Past Medical History:  Diagnosis Date  . Complication of anesthesia   . Disturbance of skin sensation 11/07/2013  . Hip pain, left   . History of kidney stones   . Hypertension   . Kidney stone    lithotripsy  . PONV (postoperative nausea and vomiting)     Past Surgical History:  Procedure Laterality Date  . ABDOMINAL HYSTERECTOMY    . CHOLECYSTECTOMY  12/2017  . HIP SURGERY Left    bursectomy  . OVARIAN CYST REMOVAL    . TONSILLECTOMY      Home Medications:  No medications prior to admission.    Allergies: No Known Allergies  Family History  Problem Relation Age of Onset  . Heart disease Mother   . Hypertension Mother   . Bladder Cancer Mother   . Stroke Mother   . Heart disease Father   . Hypertension Father   . Stroke Father   . Hypertension Sister   . Hypertension Son   . Hypertension Brother   . Hypertension Sister   . Hypertension Sister   . Hypertension Brother   . Migraines Neg Hx   . Seizures Neg Hx     Social History:  reports that she has never smoked. She has never used smokeless tobacco. She reports that she drinks alcohol. She reports that she does not use drugs.  ROS: A complete review of systems was performed.  All systems are negative except for pertinent findings as noted.  Physical Exam:  Vital signs in last 24 hours:   General:  Alert and oriented, No acute distress HEENT: Normocephalic, atraumatic Neck: No JVD or lymphadenopathy Cardiovascular: Regular rate and rhythm Lungs: Clear bilaterally Abdomen: Soft, nontender, nondistended, no abdominal masses Back: No CVA tenderness Extremities: No edema Neurologic: Grossly intact  Laboratory Data:  No results found for this or any previous visit (from the past 24 hour(s)). No  results found for this or any previous visit (from the past 240 hour(s)). Creatinine: No results for input(s): CREATININE in the last 168 hours.  Radiologic Imaging: No results found.  Impression/Assessment:  Right upper ureteral stone, 7 mm, Hounsfield unit 630  Plan:  Extracorporeal shock wave lithotripsy  Chelsea AusStephen M Jakevion Arney 12/30/2017, 5:37 PM  Bertram MillardStephen M. Nthony Lefferts MD

## 2018-01-02 ENCOUNTER — Other Ambulatory Visit: Payer: Self-pay

## 2018-01-02 ENCOUNTER — Encounter (HOSPITAL_COMMUNITY): Payer: Self-pay | Admitting: *Deleted

## 2018-01-02 ENCOUNTER — Ambulatory Visit (HOSPITAL_COMMUNITY)
Admission: RE | Admit: 2018-01-02 | Discharge: 2018-01-02 | Disposition: A | Discharge status: Home / Self Care | Payer: Federal, State, Local not specified - PPO | Source: Ambulatory Visit | Attending: Urology | Admitting: Urology

## 2018-01-02 ENCOUNTER — Ambulatory Visit (HOSPITAL_COMMUNITY): Payer: Federal, State, Local not specified - PPO

## 2018-01-02 ENCOUNTER — Encounter (HOSPITAL_COMMUNITY)
Admission: RE | Disposition: A | Discharge status: Home / Self Care | Payer: Self-pay | Source: Ambulatory Visit | Attending: Urology

## 2018-01-02 DIAGNOSIS — N201 Calculus of ureter: Secondary | ICD-10-CM

## 2018-01-02 DIAGNOSIS — N200 Calculus of kidney: Secondary | ICD-10-CM | POA: Insufficient documentation

## 2018-01-02 DIAGNOSIS — I10 Essential (primary) hypertension: Secondary | ICD-10-CM | POA: Diagnosis not present

## 2018-01-02 DIAGNOSIS — Z7982 Long term (current) use of aspirin: Secondary | ICD-10-CM | POA: Insufficient documentation

## 2018-01-02 DIAGNOSIS — Z79899 Other long term (current) drug therapy: Secondary | ICD-10-CM | POA: Insufficient documentation

## 2018-01-02 DIAGNOSIS — Z87442 Personal history of urinary calculi: Secondary | ICD-10-CM | POA: Insufficient documentation

## 2018-01-02 HISTORY — DX: Personal history of urinary calculi: Z87.442

## 2018-01-02 HISTORY — PX: EXTRACORPOREAL SHOCK WAVE LITHOTRIPSY: SHX1557

## 2018-01-02 HISTORY — DX: Adverse effect of unspecified anesthetic, initial encounter: T41.45XA

## 2018-01-02 HISTORY — DX: Other specified postprocedural states: Z98.890

## 2018-01-02 HISTORY — DX: Nausea with vomiting, unspecified: R11.2

## 2018-01-02 HISTORY — DX: Other complications of anesthesia, initial encounter: T88.59XA

## 2018-01-02 SURGERY — LITHOTRIPSY, ESWL
Anesthesia: LOCAL | Laterality: Right

## 2018-01-02 MED ORDER — DIPHENHYDRAMINE HCL 25 MG PO CAPS
25.0000 mg | ORAL_CAPSULE | ORAL | Status: AC
  Administered 2018-01-02: 25 mg via ORAL
  Filled 2018-01-02: qty 1

## 2018-01-02 MED ORDER — TAMSULOSIN HCL 0.4 MG PO CAPS: 0.4000 mg | ORAL_CAPSULE | Freq: Every day | ORAL | 0 refills | Status: DC

## 2018-01-02 MED ORDER — CIPROFLOXACIN HCL 500 MG PO TABS
500.0000 mg | ORAL_TABLET | ORAL | Status: AC
  Administered 2018-01-02: 500 mg via ORAL
  Filled 2018-01-02: qty 1

## 2018-01-02 MED ORDER — SODIUM CHLORIDE 0.9 % IV SOLN
INTRAVENOUS | Status: DC
  Administered 2018-01-02: 07:00:00 via INTRAVENOUS

## 2018-01-02 MED ORDER — ALBUTEROL SULFATE HFA 108 (90 BASE) MCG/ACT IN AERS
INHALATION_SPRAY | RESPIRATORY_TRACT | Status: AC
  Filled 2018-01-02: qty 6.7

## 2018-01-02 MED ORDER — DIAZEPAM 5 MG PO TABS
10.0000 mg | ORAL_TABLET | ORAL | Status: AC
  Administered 2018-01-02: 10 mg via ORAL
  Filled 2018-01-02: qty 2

## 2018-01-02 SURGICAL SUPPLY — 2 items
COVER SURGICAL LIGHT HANDLE (MISCELLANEOUS) ×2 IMPLANT
TOWEL OR 17X26 10 PK STRL BLUE (TOWEL DISPOSABLE) ×2 IMPLANT

## 2018-01-02 NOTE — Discharge Instructions (Signed)
Moderate Conscious Sedation, Adult, Care After °These instructions provide you with information about caring for yourself after your procedure. Your health care provider may also give you more specific instructions. Your treatment has been planned according to current medical practices, but problems sometimes occur. Call your health care provider if you have any problems or questions after your procedure. °What can I expect after the procedure? °After your procedure, it is common: °· To feel sleepy for several hours. °· To feel clumsy and have poor balance for several hours. °· To have poor judgment for several hours. °· To vomit if you eat too soon. ° °Follow these instructions at home: °For at least 24 hours after the procedure: ° °· Do not: °? Participate in activities where you could fall or become injured. °? Drive. °? Use heavy machinery. °? Drink alcohol. °? Take sleeping pills or medicines that cause drowsiness. °? Make important decisions or sign legal documents. °? Take care of children on your own. °· Rest. °Eating and drinking °· Follow the diet recommended by your health care provider. °· If you vomit: °? Drink water, juice, or soup when you can drink without vomiting. °? Make sure you have little or no nausea before eating solid foods. °General instructions °· Have a responsible adult stay with you until you are awake and alert. °· Take over-the-counter and prescription medicines only as told by your health care provider. °· If you smoke, do not smoke without supervision. °· Keep all follow-up visits as told by your health care provider. This is important. °Contact a health care provider if: °· You keep feeling nauseous or you keep vomiting. °· You feel light-headed. °· You develop a rash. °· You have a fever. °Get help right away if: °· You have trouble breathing. °This information is not intended to replace advice given to you by your health care provider. Make sure you discuss any questions you have  with your health care provider. °Document Released: 04/11/2013 Document Revised: 11/24/2015 Document Reviewed: 10/11/2015 °Elsevier Interactive Patient Education © 2018 Else[employer] Inc[employer] °Lithotripsy, Care After °This sheet gives you information about how to care for yourself after your procedure. Your health care provider may also give you more specific instructions. If you have problems or questions, contact your health care provider. °What can I expect after the procedure? °After the procedure, it is common to have: °· Some blood in your urine. This should only last for a few days. °· Soreness in your back, sides, or upper abdomen for a few days. °· Blotches or bruises on your back where the pressure wave entered the skin. °· Pain, discomfort, or nausea when pieces (fragments) of the kidney stone move through the tube that carries urine from the kidney to the bladder (ureter). Stone fragments may pass soon after the procedure, but they may continue to pass for up to 4-8 weeks. °? If you have severe pain or nausea, contact your health care provider. This may be caused by a large stone that was not broken up, and this may mean that you need more treatment. °· Some pain or discomfort during urination. °· Some pain or discomfort in the lower abdomen or (in men) at the base of the penis. ° °Follow these instructions at home: °Medicines °· Take over-the-counter and prescription medicines only as told by your health care provider. °· If you were prescribed an antibiotic medicine, take it as told by your health care provider. Do not stop taking the antibiotic even if you   start to feel better. °· Do not drive for 24 hours if you were given a medicine to help you relax (sedative). °· Do not drive or use heavy machinery while taking prescription pain medicine. °Eating and drinking °· Drink enough water and fluids to keep your urine clear or pale yellow. This helps any remaining pieces of the stone to pass. It can also help  prevent new stones from forming. °· Eat plenty of fresh fruits and vegetables. °· Follow instructions from your health care provider about eating and drinking restrictions. You may be instructed: °? To reduce how much salt (sodium) you eat or drink. Check ingredients and nutrition facts on packaged foods and beverages. °? To reduce how much meat you eat. °· Eat the recommended amount of calcium for your age and gender. Ask your health care provider how much calcium you should have. °General instructions °· Get plenty of rest. °· Most people can resume normal activities 1-2 days after the procedure. Ask your health care provider what activities are safe for you. °· If directed, strain all urine through the strainer that was provided by your health care provider. °? Keep all fragments for your health care provider to see. Any stones that are found may be sent to a medical lab for examination. The stone may be as small as a grain of salt. °· Keep all follow-up visits as told by your health care provider. This is important. °Contact a health care provider if: °· You have pain that is severe or does not get better with medicine. °· You have nausea that is severe or does not go away. °· You have blood in your urine longer than your health care provider told you to expect. °· You have more blood in your urine. °· You have pain during urination that does not go away. °· You urinate more frequently than usual and this does not go away. °· You develop a rash or any other possible signs of an allergic reaction. °Get help right away if: °· You have severe pain in your back, sides, or upper abdomen. °· You have severe pain while urinating. °· Your urine is very dark red. °· You have blood in your stool (feces). °· You cannot pass any urine at all. °· You feel a strong urge to urinate after emptying your bladder. °· You have a fever or chills. °· You develop shortness of breath, difficulty breathing, or chest pain. °· You have  severe nausea that leads to persistent vomiting. °· You faint. °Summary °· After this procedure, it is common to have some pain, discomfort, or nausea when pieces (fragments) of the kidney stone move through the tube that carries urine from the kidney to the bladder (ureter). If this pain or nausea is severe, however, you should contact your health care provider. °· Most people can resume normal activities 1-2 days after the procedure. Ask your health care provider what activities are safe for you. °· Drink enough water and fluids to keep your urine clear or pale yellow. This helps any remaining pieces of the stone to pass, and it can help prevent new stones from forming. °· If directed, strain your urine and keep all fragments for your health care provider to see. Fragments or stones may be as small as a grain of salt. °· Get help right away if you have severe pain in your back, sides, or upper abdomen or have severe pain while urinating. °This information is not intended to replace advice   given to you by your health care provider. Make sure you discuss any questions you have with your health care provider. °Document Released: 07/11/2007 Document Revised: 05/12/2016 Document Reviewed: 05/12/2016 °Elsevier Interactive Patient Education © 2018 Elsevier Inc. ° °See [facility] discharge instructions in chart. °

## 2018-01-02 NOTE — Op Note (Signed)
See [facility] note scanned into chart. 

## 2018-01-03 ENCOUNTER — Encounter (HOSPITAL_COMMUNITY): Payer: Self-pay | Admitting: Urology

## 2018-01-17 DIAGNOSIS — N200 Calculus of kidney: Secondary | ICD-10-CM | POA: Diagnosis not present

## 2018-02-17 DIAGNOSIS — N200 Calculus of kidney: Secondary | ICD-10-CM | POA: Diagnosis not present

## 2018-03-17 ENCOUNTER — Other Ambulatory Visit: Payer: Self-pay | Admitting: Adult Health

## 2018-03-17 DIAGNOSIS — R739 Hyperglycemia, unspecified: Secondary | ICD-10-CM | POA: Diagnosis not present

## 2018-03-17 DIAGNOSIS — I10 Essential (primary) hypertension: Secondary | ICD-10-CM | POA: Diagnosis not present

## 2018-03-17 DIAGNOSIS — E559 Vitamin D deficiency, unspecified: Secondary | ICD-10-CM | POA: Diagnosis not present

## 2018-03-17 DIAGNOSIS — E785 Hyperlipidemia, unspecified: Secondary | ICD-10-CM | POA: Diagnosis not present

## 2018-06-07 ENCOUNTER — Encounter: Payer: Self-pay | Admitting: Adult Health

## 2018-06-07 ENCOUNTER — Ambulatory Visit (INDEPENDENT_AMBULATORY_CARE_PROVIDER_SITE_OTHER): Payer: Federal, State, Local not specified - PPO | Admitting: Adult Health

## 2018-06-07 VITALS — BP 132/81 | HR 74 | Ht 66.0 in | Wt 176.4 lb

## 2018-06-07 DIAGNOSIS — R209 Unspecified disturbances of skin sensation: Secondary | ICD-10-CM | POA: Diagnosis not present

## 2018-06-07 MED ORDER — DULOXETINE HCL 30 MG PO CPEP: 30.0000 mg | ORAL_CAPSULE | Freq: Two times a day (BID) | ORAL | 3 refills | Status: DC

## 2018-06-07 NOTE — Progress Notes (Signed)
I have read the note, and I agree with the clinical assessment and plan.  Charles K Willis   

## 2018-06-07 NOTE — Patient Instructions (Signed)
Your Plan:  Continue Cymbalta If your symptoms worsen or you develop new symptoms please let us know.   Thank you for coming to see us at [facility]. I hope we have been able to provide you high quality care today.  You may receive a patient satisfaction survey over the next few weeks. We would appreciate your feedback and comments so that we may continue to improve ourselves and the health of our patients.  

## 2018-06-07 NOTE — Progress Notes (Signed)
PATIENT: Sheila Allen DOB: 06-09-1956  REASON FOR VISIT: follow up HISTORY FROM: patient  HISTORY OF PRESENT ILLNESS: Today 06/07/18:  Sheila Allen is a 62 year old female with a history of left-sided sensory changes.  She returns today for her yearly follow-up.  She reports that Cymbalta continues to work well for her.  She denies any recurrence of symptoms.  Overall she continues to do well.  She returns today for an evaluation.  HISTORY 01/12/17 Sheila Allen is a 62 year old female with a history of left-sided sensory changes. She returns today for follow-up. She reports that she has remained on Cymbalta and tolerating it well. She has not had recurrence of symptoms. She states that she overall is doing well. She denies any new neurological symptoms. Denies any new medical issues. She returns today for an evaluation.   REVIEW OF SYSTEMS: Out of a complete 14 system review of symptoms, the patient complains only of the following symptoms, and all other reviewed systems are negative.  See HPI  ALLERGIES: No Known Allergies  HOME MEDICATIONS: Outpatient Medications Prior to Visit  Medication Sig Dispense Refill  . ALPRAZolam (XANAX) 0.25 MG tablet Take 0.25 mg by mouth at bedtime as needed for anxiety.    Marland Kitchen. aspirin EC 81 MG EC tablet Take 1 tablet (81 mg total) by mouth daily.    Marland Kitchen. atorvastatin (LIPITOR) 20 MG tablet TK 1 T PO QD  5  . estradiol (VIVELLE-DOT) 0.075 MG/24HR Place 1 patch onto the skin 2 (two) times a week. Monday and Thursday      . metoprolol succinate (TOPROL-XL) 50 MG 24 hr tablet Take 50 mg by mouth daily. Take with or immediately following a meal.    . zolpidem (AMBIEN) 10 MG tablet Take 10 mg by mouth at bedtime as needed for sleep.    . DULoxetine (CYMBALTA) 30 MG capsule Take 1 capsule (30 mg total) by mouth 2 (two) times daily. 180 capsule 3  . tamsulosin (FLOMAX) 0.4 MG CAPS capsule Take 1 capsule (0.4 mg total) by mouth daily after supper. 15 capsule 0     No facility-administered medications prior to visit.     PAST MEDICAL HISTORY: Past Medical History:  Diagnosis Date  . Complication of anesthesia   . Disturbance of skin sensation 11/07/2013  . Hip pain, left   . History of kidney stones   . Hypertension   . Kidney stone    lithotripsy  . PONV (postoperative nausea and vomiting)     PAST SURGICAL HISTORY: Past Surgical History:  Procedure Laterality Date  . ABDOMINAL HYSTERECTOMY    . CHOLECYSTECTOMY  12/2017  . EXTRACORPOREAL SHOCK WAVE LITHOTRIPSY Right 01/02/2018   Procedure: RIGHT EXTRACORPOREAL SHOCK WAVE LITHOTRIPSY (ESWL);  Surgeon: Marcine Matarahlstedt, Stephen, MD;  Location: [facility];  Service: Urology;  Laterality: Right;  . HIP SURGERY Left    bursectomy  . OVARIAN CYST REMOVAL    . TONSILLECTOMY      FAMILY HISTORY: Family History  Problem Relation Age of Onset  . Heart disease Mother   . Hypertension Mother   . Bladder Cancer Mother   . Stroke Mother   . Heart disease Father   . Hypertension Father   . Stroke Father   . Hypertension Sister   . Hypertension Son   . Hypertension Brother   . Hypertension Sister   . Hypertension Sister   . Hypertension Brother   . Migraines Neg Hx   . Seizures Neg Hx  SOCIAL HISTORY: Social History   Socioeconomic History  . Marital status: Married    Spouse name: Perlie Gold  . Number of children: 2  . Years of education: 75  . Highest education level: Not on file  Occupational History  . Occupation: Retired    Comment: Comptroller  . Occupation: PT substitute Runner, broadcasting/film/video    Comment: Civil Service fast streamer  Social Needs  . Financial resource strain: Not on file  . Food insecurity:    Worry: Not on file    Inability: Not on file  . Transportation needs:    Medical: Not on file    Non-medical: Not on file  Tobacco Use  . Smoking status: Never Smoker  . Smokeless tobacco: Never Used  Substance and Sexual Activity  . Alcohol use: Yes    Alcohol/week: 0.0 standard  drinks    Comment: OCASIONAL  . Drug use: No  . Sexual activity: Not on file  Lifestyle  . Physical activity:    Days per week: Not on file    Minutes per session: Not on file  . Stress: Not on file  Relationships  . Social connections:    Talks on phone: Not on file    Gets together: Not on file    Attends religious service: Not on file    Active member of club or organization: Not on file    Attends meetings of clubs or organizations: Not on file    Relationship status: Not on file  . Intimate partner violence:    Fear of current or ex partner: Not on file    Emotionally abused: Not on file    Physically abused: Not on file    Forced sexual activity: Not on file  Other Topics Concern  . Not on file  Social History Narrative   Patient lives at home with family.. 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   Right handed.   Education 12 th grade.   Caffeine none       PHYSICAL EXAM  Vitals:   06/07/18 1316  BP: 132/81  Pulse: 74  Weight: 176 lb 6.4 oz (80 kg)  Height: 5\' 6"  (1.676 m)   Body mass index is 28.47 kg/m.  Generalized: Well developed, in no acute distress   Neurological examination  Mentation: Alert oriented to time, place, history taking. Follows all commands speech and language fluent Cranial nerve II-XII: Pupils were equal round reactive to light. Extraocular movements were full, visual field were full on confrontational test. Facial sensation and strength were normal. Uvula tongue midline. Head turning and shoulder shrug  were normal and symmetric. Motor: The motor testing reveals 5 over 5 strength of all 4 extremities. Good symmetric motor tone is noted throughout.  Sensory: Sensory testing is intact to soft touch on all 4 extremities. No evidence of extinction is noted.  Coordination: Cerebellar testing reveals good finger-nose-finger and heel-to-shin bilaterally.  Gait and station: Gait is normal.  Reflexes:  Deep tendon reflexes are symmetric and normal bilaterally.   DIAGNOSTIC DATA (LABS, IMAGING, TESTING) - I reviewed patient records, labs, notes, testing and imaging myself where available.  Lab Results  Component Value Date   WBC 9.2 07/07/2013   HGB 12.8 07/07/2013   HCT 38.7 07/07/2013   MCV 87.6 07/07/2013   PLT 366 07/07/2013      Component Value Date/Time   NA 140 07/07/2013 0337   K 3.1 (L) 07/07/2013 0337   CL 99  07/07/2013 0337   CO2 30 07/07/2013 0337   GLUCOSE 121 (H) 07/07/2013 0337   BUN 17 07/07/2013 0337   CREATININE 0.65 07/07/2013 0941   CALCIUM 9.1 07/07/2013 0337   PROT 7.1 07/15/2011 1410   ALBUMIN 3.7 07/15/2011 1410   AST 23 07/15/2011 1410   ALT 16 01/01/2014 0739   ALKPHOS 63 07/15/2011 1410   BILITOT 0.6 07/15/2011 1410   GFRNONAA >90 07/07/2013 0941   GFRAA >90 07/07/2013 0941   Lab Results  Component Value Date   CHOL 216 (H) 01/01/2014   HDL 50.00 01/01/2014   LDLCALC 138 (H) 01/01/2014   TRIG 142.0 01/01/2014   CHOLHDL 4 01/01/2014   Lab Results  Component Value Date   HGBA1C 5.6 07/07/2013   Lab Results  Component Value Date   VITAMINB12 454 02/20/2014   No results found for: TSH    ASSESSMENT AND PLAN 62 y.o. year old female  has a past medical history of Complication of anesthesia, Disturbance of skin sensation (11/07/2013), Hip pain, left, History of kidney stones, Hypertension, Kidney stone, and PONV (postoperative nausea and vomiting). here with :  1.  Left-sided sensory changes  Overall the patient has done well.  She will continue on Cymbalta.  She is advised that if her symptoms worsen or she develops new symptoms she should let us know.  She will follow-up in 6 months or sooner if needed.   I spent 15 minutes with the patient. 50% of this time was spent reviewing plan.   Butch Penny, MSN, NP-C 06/07/2018, 2:01 PM [facility] [address], [address] [place], [place] [address] [phone]

## 2018-07-17 DIAGNOSIS — N200 Calculus of kidney: Secondary | ICD-10-CM | POA: Diagnosis not present

## 2018-07-18 ENCOUNTER — Other Ambulatory Visit: Payer: Self-pay | Admitting: Urology

## 2018-07-19 NOTE — H&P (Signed)
CC: I have kidney stones.  HPI: Sheila Allen is a 63 year-old female established patient who is here for renal calculi.  The problem is on the left side. She first stated noticing pain on approximately 12/09/2017. This is not her first kidney stone. She is currently having back pain and nausea. She denies having flank pain, groin pain, vomiting, fever, and chills.   Patient history of stones to 2013. She passed a stone in 2018 and CT scan in January 2018 showed bilateral stones largest in the left kidney with a 2-4 mm right distal stone. F/u US negative.   She underwent right ESWL for a right prox stone Jul 2019. This resolved.   She developed left flank pain 4 days ago with N/V. The pain has moved to the LLQ. No stone passage. 3-10 rbc's. She is staying hydrated. No further emesis.       ALLERGIES: No Allergies    MEDICATIONS: Metoprolol Succinate 50 mg tablet, extended release 24 hr  Alprazolam 0.25 mg tablet tablet PRN  Aspirin Ec 81 mg tablet, delayed release  Duloxetine Hcl 30 mg capsule,delayed release  Estradiol 0.075 mg/24 hour patch, transdermal semiweekly  Zolpidem Tartrate 10 mg tablet     GU PSH: ESWL - 01/02/2018, 2014 Hysterectomy Unilat SO - 2014      PSH Notes: Lithotripsy, Excision Of Ischial Bursa, Hysterectomy   NON-GU PSH: Cholecystectomy (laparoscopic) Remove Ischial Bursa - 2014    GU PMH: Renal calculus - 02/17/2018, - 01/17/2018, - 12/20/2017, - 12/13/2017, - 11/08/2017, Nephrolithiasis, - 2014 Dysuria - 12/20/2017 Ureteral calculus - 12/13/2017 Renal cyst - 11/08/2017, Renal cyst, acquired, - 2014 Other microscopic hematuria - 07/30/2016 LLQ pain, Abdominal pain, LLQ (left lower quadrant) - 2016      PMH Notes:  2012-09-20 13:56:06 - Note: Arthritis   NON-GU PMH: Bacteriuria, Bacteriuria - 2016, Bacteriuria, asymptomatic, - 2014 Encounter for general adult medical examination without abnormal findings, Encounter for preventive health examination -  2016 Personal history of other diseases of the circulatory system, History of hypertension - 2014    FAMILY HISTORY: Bladder Cancer - Mother Death In The Family Father - Runs In Family Death In The Family Mother - Runs In Family Family Health Status Children is 1 daughter and 1 - Runs In Family Heart Disease - Runs In Family nephrolithiasis - Runs In Family Prostate Cancer - Runs In Family   SOCIAL HISTORY: Marital Status: Married Preferred Language: English; Ethnicity: Not Hispanic Or Latino; Race: White Current Smoking Status: Patient has never smoked.   Tobacco Use Assessment Completed: Used Tobacco in last 30 days? Social Drinker.  Drinks 1 caffeinated drink per day.     Notes: Alcohol Use, Never A Smoker, Caffeine Use, Marital History - Currently Married, Retired From Work   REVIEW OF SYSTEMS:    GU Review Female:   left abd pain. Patient denies frequent urination, hard to postpone urination, burning /pain with urination, get up at night to urinate, leakage of urine, stream starts and stops, trouble starting your stream, have to strain to urinate, and being pregnant.  Gastrointestinal (Upper):   Patient reports nausea and vomiting. Patient denies indigestion/ heartburn.  Gastrointestinal (Lower):   Patient denies diarrhea and constipation.  Constitutional:   Patient denies fever, night sweats, weight loss, and fatigue.  Skin:   Patient denies skin rash/ lesion and itching.  Eyes:   Patient denies blurred vision and double vision.  Ears/ Nose/ Throat:   Patient denies sore throat and sinus problems.  Hematologic/Lymphatic:   Patient denies swollen glands and easy bruising.  Cardiovascular:   Patient denies leg swelling and chest pains.  Respiratory:   Patient denies cough and shortness of breath.  Endocrine:   Patient denies excessive thirst.  Musculoskeletal:   Patient denies back pain and joint pain.  Neurological:   Patient denies headaches and dizziness.  Psychologic:    Patient denies depression and anxiety.   VITAL SIGNS:      07/17/2018 02:40 PM  Weight 175 lb / 79.38 kg  Height 67 in / 170.18 cm  BP 145/87 mmHg  Pulse 70 /min  Temperature 97.5 F / 36.3 C  BMI 27.4 kg/m   MULTI-SYSTEM PHYSICAL EXAMINATION:    Constitutional: Well-nourished. No physical deformities. Normally developed. Good grooming.  Neck: Neck symmetrical, not swollen. Normal tracheal position.  Respiratory: No labored breathing, no use of accessory muscles.   Cardiovascular: Normal temperature, normal extremity pulses, no swelling, no varicosities.  Neurologic / Psychiatric: Oriented to time, oriented to place, oriented to person. No depression, no anxiety, no agitation.  Gastrointestinal: No mass, no tenderness, no rigidity, non obese abdomen.     PAST DATA REVIEWED:  Source Of History:  Patient  X-Ray Review: KUB: Reviewed Films. jul 2019 C.T. Abdomen/Pelvis: Reviewed Films. 2018     PROCEDURES:         KUB - [id number]  A single view of the abdomen is obtained.  Calculi:  7 mm left proximal stone-this appears to be a stone that was located in the left upper pole on her prior KUB      The bones appeared normal. The bowel gas pattern appeared normal. The soft tissues were unremarkable.         Urinalysis Dipstick Dipstick Cont'd  Color: Yellow Bilirubin: Neg mg/dL  Appearance: Clear Ketones: Neg mg/dL  Specific Gravity: 8.978 Blood: Neg ery/uL  pH: 7.5 Protein: Trace mg/dL  Glucose: Neg mg/dL Urobilinogen: 1.0 mg/dL    Nitrites: Neg    Leukocyte Esterase: Neg leu/uL    ASSESSMENT:      ICD-10 Details  1 GU:   Renal calculus - N20.0    PLAN:            Medications Stop Meds: Ondansetron Hcl 8 mg tablet 1 tablet PO Q 8 H PRN  Start: 12/13/2017  Discontinue: 07/17/2018  - Reason: The medication cycle was completed.            Orders X-Rays: KUB          Schedule Return Visit/Planned Activity: Next Available Appointment - Schedule Surgery           Document Letter(s):  Created for Patient: Clinical Summary         Notes:   Left proximal stone-I discussed with her the nature risks benefits and alternatives to left shockwave lithotripsy. We discussed continued some passage or ureteroscopy. Discussed risk of bleeding and infection among others. All questions answered. She elects to proceed. We discussed one of my colleagues would likely be doing the procedure.

## 2018-07-20 ENCOUNTER — Ambulatory Visit (HOSPITAL_COMMUNITY): Payer: Federal, State, Local not specified - PPO

## 2018-07-20 ENCOUNTER — Ambulatory Visit (HOSPITAL_COMMUNITY)
Admission: RE | Admit: 2018-07-20 | Discharge: 2018-07-20 | Disposition: A | Discharge status: Home / Self Care | Payer: Federal, State, Local not specified - PPO | Source: Other Acute Inpatient Hospital | Attending: Urology | Admitting: Urology

## 2018-07-20 ENCOUNTER — Encounter (HOSPITAL_COMMUNITY): Payer: Self-pay | Admitting: General Practice

## 2018-07-20 ENCOUNTER — Encounter (HOSPITAL_COMMUNITY)
Admission: RE | Disposition: A | Discharge status: Home / Self Care | Payer: Self-pay | Source: Other Acute Inpatient Hospital | Attending: Urology

## 2018-07-20 DIAGNOSIS — I10 Essential (primary) hypertension: Secondary | ICD-10-CM | POA: Diagnosis not present

## 2018-07-20 DIAGNOSIS — Z87442 Personal history of urinary calculi: Secondary | ICD-10-CM | POA: Diagnosis not present

## 2018-07-20 DIAGNOSIS — Z79899 Other long term (current) drug therapy: Secondary | ICD-10-CM | POA: Diagnosis not present

## 2018-07-20 DIAGNOSIS — N201 Calculus of ureter: Secondary | ICD-10-CM

## 2018-07-20 DIAGNOSIS — Z79890 Hormone replacement therapy (postmenopausal): Secondary | ICD-10-CM | POA: Insufficient documentation

## 2018-07-20 DIAGNOSIS — Z7982 Long term (current) use of aspirin: Secondary | ICD-10-CM | POA: Insufficient documentation

## 2018-07-20 DIAGNOSIS — N200 Calculus of kidney: Secondary | ICD-10-CM | POA: Insufficient documentation

## 2018-07-20 DIAGNOSIS — Z8249 Family history of ischemic heart disease and other diseases of the circulatory system: Secondary | ICD-10-CM | POA: Diagnosis not present

## 2018-07-20 HISTORY — PX: EXTRACORPOREAL SHOCK WAVE LITHOTRIPSY: SHX1557

## 2018-07-20 SURGERY — LITHOTRIPSY, ESWL
Anesthesia: LOCAL | Laterality: Left

## 2018-07-20 MED ORDER — SODIUM CHLORIDE 0.9% FLUSH: 3.0000 mL | Freq: Two times a day (BID) | INTRAVENOUS | Status: DC

## 2018-07-20 MED ORDER — ACETAMINOPHEN 650 MG RE SUPP
650.0000 mg | RECTAL | Status: DC | PRN
  Filled 2018-07-20: qty 1

## 2018-07-20 MED ORDER — MORPHINE SULFATE (PF) 4 MG/ML IV SOLN: 2.0000 mg | INTRAVENOUS | Status: DC | PRN

## 2018-07-20 MED ORDER — SODIUM CHLORIDE 0.9% FLUSH: 3.0000 mL | INTRAVENOUS | Status: DC | PRN

## 2018-07-20 MED ORDER — HYDROCODONE-ACETAMINOPHEN 5-325 MG PO TABS: 1.0000 | ORAL_TABLET | ORAL | 0 refills | Status: DC | PRN

## 2018-07-20 MED ORDER — ONDANSETRON HCL 4 MG PO TABS: 4.0000 mg | ORAL_TABLET | Freq: Four times a day (QID) | ORAL | 1 refills | Status: DC | PRN

## 2018-07-20 MED ORDER — DIAZEPAM 5 MG PO TABS
10.0000 mg | ORAL_TABLET | ORAL | Status: AC
  Administered 2018-07-20: 10 mg via ORAL
  Filled 2018-07-20: qty 2

## 2018-07-20 MED ORDER — CIPROFLOXACIN HCL 500 MG PO TABS
500.0000 mg | ORAL_TABLET | ORAL | Status: AC
  Administered 2018-07-20: 500 mg via ORAL
  Filled 2018-07-20: qty 1

## 2018-07-20 MED ORDER — SODIUM CHLORIDE 0.9 % IV SOLN: 250.0000 mL | INTRAVENOUS | Status: DC | PRN

## 2018-07-20 MED ORDER — DIPHENHYDRAMINE HCL 25 MG PO CAPS
25.0000 mg | ORAL_CAPSULE | ORAL | Status: AC
  Administered 2018-07-20: 25 mg via ORAL
  Filled 2018-07-20: qty 1

## 2018-07-20 MED ORDER — ACETAMINOPHEN 325 MG PO TABS: 650.0000 mg | ORAL_TABLET | ORAL | Status: DC | PRN

## 2018-07-20 MED ORDER — SODIUM CHLORIDE 0.9 % IV SOLN
INTRAVENOUS | Status: DC
  Administered 2018-07-20: 07:00:00 via INTRAVENOUS

## 2018-07-20 MED ORDER — OXYCODONE HCL 5 MG PO TABS: 5.0000 mg | ORAL_TABLET | ORAL | Status: DC | PRN

## 2018-07-20 NOTE — Discharge Instructions (Signed)

## 2018-07-20 NOTE — Interval H&P Note (Signed)
History and Physical Interval Note:  Sheila Allen is now in the left renal pelvis  07/20/2018 8:32 AM  Sheila Allen  has presented today for surgery, with the diagnosis of LEFT PROXIMAL STONE  The various methods of treatment have been discussed with the patient and family. After consideration of risks, benefits and other options for treatment, the patient has consented to  Procedure(s): EXTRACORPOREAL SHOCK WAVE LITHOTRIPSY (ESWL) (Left) as a surgical intervention .  The patient's history has been reviewed, patient examined, no change in status, stable for surgery.  I have reviewed the patient's chart and labs.  Questions were answered to the patient's satisfaction.     Bjorn Pippin

## 2018-07-20 NOTE — Interval H&P Note (Signed)
History and Physical Interval Note:  KUB shows stone now in the left renal pelvis.   07/20/2018 8:32 AM  Sheila Allen  has presented today for surgery, with the diagnosis of LEFT PROXIMAL STONE  The various methods of treatment have been discussed with the patient and family. After consideration of risks, benefits and other options for treatment, the patient has consented to  Procedure(s): EXTRACORPOREAL SHOCK WAVE LITHOTRIPSY (ESWL) (Left) as a surgical intervention .  The patient's history has been reviewed, patient examined, no change in status, stable for surgery.  I have reviewed the patient's chart and labs.  Questions were answered to the patient's satisfaction.     Bjorn Pippin

## 2018-07-21 ENCOUNTER — Encounter (HOSPITAL_COMMUNITY): Payer: Self-pay | Admitting: Urology

## 2018-07-25 DIAGNOSIS — Z01419 Encounter for gynecological examination (general) (routine) without abnormal findings: Secondary | ICD-10-CM | POA: Diagnosis not present

## 2018-07-25 DIAGNOSIS — Z6829 Body mass index (BMI) 29.0-29.9, adult: Secondary | ICD-10-CM | POA: Diagnosis not present

## 2018-07-25 DIAGNOSIS — Z1231 Encounter for screening mammogram for malignant neoplasm of breast: Secondary | ICD-10-CM | POA: Diagnosis not present

## 2018-07-25 DIAGNOSIS — Z13820 Encounter for screening for osteoporosis: Secondary | ICD-10-CM | POA: Diagnosis not present

## 2018-07-25 LAB — HM DEXA SCAN

## 2018-08-04 DIAGNOSIS — J019 Acute sinusitis, unspecified: Secondary | ICD-10-CM | POA: Diagnosis not present

## 2018-08-04 DIAGNOSIS — R05 Cough: Secondary | ICD-10-CM | POA: Diagnosis not present

## 2018-09-15 DIAGNOSIS — E785 Hyperlipidemia, unspecified: Secondary | ICD-10-CM | POA: Diagnosis not present

## 2018-09-15 DIAGNOSIS — E559 Vitamin D deficiency, unspecified: Secondary | ICD-10-CM | POA: Diagnosis not present

## 2018-09-15 DIAGNOSIS — I10 Essential (primary) hypertension: Secondary | ICD-10-CM | POA: Diagnosis not present

## 2018-09-15 DIAGNOSIS — R739 Hyperglycemia, unspecified: Secondary | ICD-10-CM | POA: Diagnosis not present

## 2018-09-22 ENCOUNTER — Encounter (HOSPITAL_BASED_OUTPATIENT_CLINIC_OR_DEPARTMENT_OTHER): Payer: Self-pay | Admitting: *Deleted

## 2018-09-22 ENCOUNTER — Emergency Department (HOSPITAL_BASED_OUTPATIENT_CLINIC_OR_DEPARTMENT_OTHER)
Admission: EM | Admit: 2018-09-22 | Discharge: 2018-09-22 | Disposition: A | Discharge status: Home / Self Care | Payer: Federal, State, Local not specified - PPO | Attending: Emergency Medicine | Admitting: Emergency Medicine

## 2018-09-22 ENCOUNTER — Emergency Department (HOSPITAL_BASED_OUTPATIENT_CLINIC_OR_DEPARTMENT_OTHER): Payer: Federal, State, Local not specified - PPO

## 2018-09-22 ENCOUNTER — Other Ambulatory Visit: Payer: Self-pay

## 2018-09-22 DIAGNOSIS — Y999 Unspecified external cause status: Secondary | ICD-10-CM | POA: Insufficient documentation

## 2018-09-22 DIAGNOSIS — Y92009 Unspecified place in unspecified non-institutional (private) residence as the place of occurrence of the external cause: Secondary | ICD-10-CM | POA: Insufficient documentation

## 2018-09-22 DIAGNOSIS — I10 Essential (primary) hypertension: Secondary | ICD-10-CM | POA: Diagnosis not present

## 2018-09-22 DIAGNOSIS — Y93H2 Activity, gardening and landscaping: Secondary | ICD-10-CM | POA: Insufficient documentation

## 2018-09-22 DIAGNOSIS — Z7982 Long term (current) use of aspirin: Secondary | ICD-10-CM | POA: Diagnosis not present

## 2018-09-22 DIAGNOSIS — Z79899 Other long term (current) drug therapy: Secondary | ICD-10-CM | POA: Insufficient documentation

## 2018-09-22 DIAGNOSIS — Z23 Encounter for immunization: Secondary | ICD-10-CM | POA: Insufficient documentation

## 2018-09-22 DIAGNOSIS — S61211A Laceration without foreign body of left index finger without damage to nail, initial encounter: Secondary | ICD-10-CM | POA: Insufficient documentation

## 2018-09-22 DIAGNOSIS — W293XXA Contact with powered garden and outdoor hand tools and machinery, initial encounter: Secondary | ICD-10-CM | POA: Diagnosis not present

## 2018-09-22 DIAGNOSIS — S61213A Laceration without foreign body of left middle finger without damage to nail, initial encounter: Secondary | ICD-10-CM | POA: Diagnosis not present

## 2018-09-22 DIAGNOSIS — S61219A Laceration without foreign body of unspecified finger without damage to nail, initial encounter: Secondary | ICD-10-CM

## 2018-09-22 DIAGNOSIS — S61412A Laceration without foreign body of left hand, initial encounter: Secondary | ICD-10-CM | POA: Diagnosis not present

## 2018-09-22 MED ORDER — BUPIVACAINE HCL 0.5 % IJ SOLN
50.0000 mL | Freq: Once | INTRAMUSCULAR | Status: AC
  Administered 2018-09-22: 50 mL
  Filled 2018-09-22: qty 1

## 2018-09-22 MED ORDER — BACITRACIN ZINC 500 UNIT/GM EX OINT: TOPICAL_OINTMENT | Freq: Once | CUTANEOUS | Status: DC

## 2018-09-22 MED ORDER — TETANUS-DIPHTH-ACELL PERTUSSIS 5-2.5-18.5 LF-MCG/0.5 IM SUSP
0.5000 mL | Freq: Once | INTRAMUSCULAR | Status: AC
  Administered 2018-09-22: 0.5 mL via INTRAMUSCULAR
  Filled 2018-09-22: qty 0.5

## 2018-09-22 MED ORDER — HYDROCODONE-ACETAMINOPHEN 5-325 MG PO TABS
1.0000 | ORAL_TABLET | Freq: Once | ORAL | Status: AC
  Administered 2018-09-22: 1 via ORAL
  Filled 2018-09-22: qty 1

## 2018-09-22 MED ORDER — AMOXICILLIN-POT CLAVULANATE 875-125 MG PO TABS: 1.0000 | ORAL_TABLET | Freq: Two times a day (BID) | ORAL | 0 refills | Status: DC

## 2018-09-22 MED ORDER — AMOXICILLIN-POT CLAVULANATE 875-125 MG PO TABS
1.0000 | ORAL_TABLET | Freq: Once | ORAL | Status: AC
  Administered 2018-09-22: 1 via ORAL
  Filled 2018-09-22: qty 1

## 2018-09-22 NOTE — ED Triage Notes (Signed)
Laceration to her left index and middle fingers while using hedge clippers.

## 2018-09-22 NOTE — ED Provider Notes (Signed)
[facility] EMERGENCY DEPARTMENT Provider Note   CSN: [id number] Arrival date & time: 09/22/18  1939    History   Chief Complaint Chief Complaint  Patient presents with  . Laceration    HPI Sheila Allen is a 63 y.o. female who presents for evaluation of laceration in the left index and middle finger that occurred approximately 3 PM this afternoon.  Patient reports that she was using an electric hedge tremors when she states that they hit her hand, causing lacerations to the volar aspect of the index and middle finger.  Patient states she does not know when her last tetanus shot was.  Patient states that she is not on any blood thinners but is on aspirin.  Patient denies any numbness/weakness, difficulty moving.  She does not know when her last tetanus shot was.    The history is provided by the patient.    Past Medical History:  Diagnosis Date  . Complication of anesthesia   . Disturbance of skin sensation 11/07/2013  . Hip pain, left   . History of kidney stones   . Hypertension   . Kidney stone    lithotripsy  . PONV (postoperative nausea and vomiting)     Patient Active Problem List   Diagnosis Date Noted  . Disturbance of skin sensation 11/07/2013  . Chest pain 07/07/2013  . Hypokalemia 07/07/2013  . Hypertension 07/07/2013  . Chest pain at rest 07/07/2013  . Bursitis of left hip 07/20/2011    Past Surgical History:  Procedure Laterality Date  . ABDOMINAL HYSTERECTOMY    . CHOLECYSTECTOMY  12/2017  . EXTRACORPOREAL SHOCK WAVE LITHOTRIPSY Right 01/02/2018   Procedure: RIGHT EXTRACORPOREAL SHOCK WAVE LITHOTRIPSY (ESWL);  Surgeon: Marcine Matar, MD;  Location: [facility] ORS;  Service: Urology;  Laterality: Right;  . EXTRACORPOREAL SHOCK WAVE LITHOTRIPSY Left 07/20/2018   Procedure: EXTRACORPOREAL SHOCK WAVE LITHOTRIPSY (ESWL);  Surgeon: Bjorn Pippin, MD;  Location: [facility];  Service: Urology;  Laterality: Left;  . HIP SURGERY Left    bursectomy  . OVARIAN  CYST REMOVAL    . TONSILLECTOMY       OB History   No obstetric history on file.      Home Medications    Prior to Admission medications   Medication Sig Start Date End Date Taking? Authorizing Provider  aspirin EC 81 MG EC tablet Take 1 tablet (81 mg total) by mouth daily. 07/07/13  Yes Short, Thea Silversmith, MD  atorvastatin (LIPITOR) 20 MG tablet TK 1 T PO QD 10/21/15  Yes [provider]  DULoxetine (CYMBALTA) 30 MG capsule Take 1 capsule (30 mg total) by mouth 2 (two) times daily. 06/07/18  Yes Butch Penny, NP  estradiol (VIVELLE-DOT) 0.075 MG/24HR Place 1 patch onto the skin 2 (two) times a week. Monday and Thursday     Yes [provider]  metoprolol succinate (TOPROL-XL) 50 MG 24 hr tablet Take 50 mg by mouth daily. Take with or immediately following a meal.   Yes [provider]  ALPRAZolam (XANAX) 0.25 MG tablet Take 0.25 mg by mouth at bedtime as needed for anxiety.    [provider]  amoxicillin-clavulanate (AUGMENTIN) 875-125 MG tablet Take 1 tablet by mouth every 12 (twelve) hours. 09/22/18   Maxwell Caul, PA-C  HYDROcodone-acetaminophen (NORCO) 5-325 MG tablet Take 1 tablet by mouth every 4 (four) hours as needed for moderate pain. 07/20/18   Bjorn Pippin, MD  ondansetron (ZOFRAN) 4 MG tablet Take 1 tablet (4 mg total)  by mouth every 6 (six) hours as needed for nausea or vomiting. 07/20/18 07/20/19  Bjorn Pippin, MD  zolpidem (AMBIEN) 10 MG tablet Take 10 mg by mouth at bedtime as needed for sleep.    [provider]    Family History Family History  Problem Relation Age of Onset  . Heart disease Mother   . Hypertension Mother   . Bladder Cancer Mother   . Stroke Mother   . Heart disease Father   . Hypertension Father   . Stroke Father   . Hypertension Sister   . Hypertension Son   . Hypertension Brother   . Hypertension Sister   . Hypertension Sister   . Hypertension Brother   . Migraines Neg Hx   . Seizures Neg Hx      Social History Social History   Tobacco Use  . Smoking status: Never Smoker  . Smokeless tobacco: Never Used  Substance Use Topics  . Alcohol use: Yes    Alcohol/week: 0.0 standard drinks    Comment: OCASIONAL  . Drug use: No     Allergies   Patient has no known allergies.   Review of Systems Review of Systems  Skin: Positive for wound.  Neurological: Negative for weakness and numbness.  All other systems reviewed and are negative.    Physical Exam Updated Vital Signs BP 120/78   Pulse 72   Temp 98 F (36.7 C) (Oral)   Resp 20   Ht 5\' 7"  (1.702 m)   Wt 77.1 kg   SpO2 94%   BMI 26.63 kg/m   Physical Exam Vitals signs and nursing note reviewed.  Constitutional:      Appearance: She is well-developed.  HENT:     Head: Normocephalic and atraumatic.  Eyes:     General: No scleral icterus.       Right eye: No discharge.        Left eye: No discharge.     Conjunctiva/sclera: Conjunctivae normal.  Pulmonary:     Effort: Pulmonary effort is normal.  Skin:    General: Skin is warm and dry.     Comments: Good distal cap refill. LUE is not dusky in appearance or cool to touch.  Left index finger has a extensively jagged, asymmetrical laceration that crosses diagonally over the volar surface in between the PIP and the DIP.  There are multiple edges, pieces.  Left middle finger with several jagged lacerations noted to the volar aspect that extend past the PIP just to the DIP joint.  Neurological:     Mental Status: She is alert.  Psychiatric:        Speech: Speech normal.        Behavior: Behavior normal.        ED Treatments / Results  Labs (all labs ordered are listed, but only abnormal results are displayed) Labs Reviewed - No data to display  EKG None  Radiology Dg Hand Complete Left  Result Date: 09/22/2018 CLINICAL DATA:  Laceration LEFT hand with electric hedge clippers this evening, lacerations to fingers EXAM: LEFT HAND - COMPLETE 3+ VIEW  COMPARISON:  None FINDINGS: Osseous demineralization. Scattered joint space narrowing of IP joints. More pronounced degenerative changes at first [facility] joint with radial subluxation of first metacarpal base. No acute fracture, dislocation, or bone destruction. Fingers partially superimposed on lateral view limiting assessment. No radiopaque foreign bodies. IMPRESSION: Osseous demineralization with scattered degenerative changes as above. No definite acute osseous findings. Electronically Signed   By:  Ulyses Southward M.D.   On: 09/22/2018 20:35    Procedures .Marland KitchenLaceration Repair Date/Time: 09/22/2018 9:49 PM Performed by: Maxwell Caul, PA-C Authorized by: Maxwell Caul, PA-C   Consent:    Consent obtained:  Verbal   Consent given by:  Patient   Risks discussed:  Infection, need for additional repair, pain, poor cosmetic result and poor wound healing   Alternatives discussed:  No treatment and delayed treatment Universal protocol:    Procedure explained and questions answered to patient or proxy's satisfaction: yes     Relevant documents present and verified: yes     Test results available and properly labeled: yes     Imaging studies available: yes     Required blood products, implants, devices, and special equipment available: yes     Site/side marked: yes     Immediately prior to procedure, a time out was called: yes     Patient identity confirmed:  Verbally with patient Anesthesia (see MAR for exact dosages):    Anesthesia method:  Nerve block   Block anesthetic:  Bupivacaine 0.5% w/o epi   Block injection procedure:  Anatomic landmarks identified, introduced needle, incremental injection and negative aspiration for blood   Block outcome:  Anesthesia achieved Laceration details:    Location:  Finger   Finger location:  L index finger   Length (cm):  2 Repair type:    Repair type:  Intermediate Pre-procedure details:    Preparation:  Patient was prepped and draped in usual  sterile fashion Exploration:    Hemostasis achieved with:  Direct pressure   Wound exploration: wound explored through full range of motion     Wound extent: no foreign bodies/material noted and no tendon damage noted   Treatment:    Area cleansed with:  Betadine   Amount of cleaning:  Extensive   Irrigation solution:  Sterile saline   Irrigation method:  Syringe   Visualized foreign bodies/material removed: no   Skin repair:    Repair method:  Sutures and Steri-Strips   Suture size:  4-0   Wound skin closure material used: Vicryl.   Suture technique:  Simple interrupted   Number of sutures:  9 Approximation:    Approximation:  Loose Post-procedure details:    Dressing:  Antibiotic ointment, non-adherent dressing and splint for protection   Patient tolerance of procedure:  Tolerated well, no immediate complications Comments:     This anesthesia was achieved, the wound was thoroughly and extensively irrigated with sterile saline.  Full expression of the wound showed no evidence of foreign body, evidence of tendon disruption.  There are multiple jagged parts of this laceration that made it difficult to come together.  Given complexity, multiple pieces, part of it was loosely approximated and Steri-Strips were placed over. Marland Kitchen.Laceration Repair Date/Time: 09/22/2018 9:50 PM Performed by: Maxwell Caul, PA-C Authorized by: Maxwell Caul, PA-C   Consent:    Consent obtained:  Verbal   Consent given by:  Patient   Risks discussed:  Infection, need for additional repair, pain, poor cosmetic result and poor wound healing   Alternatives discussed:  No treatment and delayed treatment Universal protocol:    Procedure explained and questions answered to patient or proxy's satisfaction: yes     Relevant documents present and verified: yes     Test results available and properly labeled: yes     Imaging studies available: yes     Required blood products, implants, devices, and special  equipment  available: yes     Site/side marked: yes     Immediately prior to procedure, a time out was called: yes     Patient identity confirmed:  Verbally with patient Anesthesia (see MAR for exact dosages):    Anesthesia method:  Nerve block   Block anesthetic:  Bupivacaine 0.5% w/o epi   Block injection procedure:  Anatomic landmarks identified, introduced needle and incremental injection Laceration details:    Location:  Finger   Finger location:  L long finger   Length (cm):  2 Repair type:    Repair type:  Intermediate Pre-procedure details:    Preparation:  Patient was prepped and draped in usual sterile fashion Exploration:    Hemostasis achieved with:  Direct pressure   Wound exploration: wound explored through full range of motion     Wound extent: no foreign bodies/material noted and no tendon damage noted   Treatment:    Area cleansed with:  Betadine   Amount of cleaning:  Extensive   Irrigation solution:  Sterile saline   Irrigation method:  Syringe   Visualized foreign bodies/material removed: no   Skin repair:    Repair method:  Sutures   Suture size:  4-0   Wound skin closure material used: Vicryl.   Suture technique:  Simple interrupted   Number of sutures:  10 Approximation:    Approximation:  Close Post-procedure details:    Dressing:  Antibiotic ointment, non-adherent dressing and splint for protection   Patient tolerance of procedure:  Tolerated well, no immediate complications   (including critical care time)  Medications Ordered in ED Medications  bacitracin ointment (has no administration in time range)  Tdap (BOOSTRIX) injection 0.5 mL (0.5 mLs Intramuscular Given 09/22/18 2012)  bupivacaine (MARCAINE) 0.5 % (with pres) injection 50 mL (50 mLs Infiltration Given by Other 09/22/18 2012)  HYDROcodone-acetaminophen (NORCO/VICODIN) 5-325 MG per tablet 1 tablet (1 tablet Oral Given 09/22/18 2030)  amoxicillin-clavulanate (AUGMENTIN) 875-125 MG per tablet  1 tablet (1 tablet Oral Given 09/22/18 2153)     Initial Impression / Assessment and Plan / ED Course  I have reviewed the triage vital signs and the nursing notes.  Pertinent labs & imaging results that were available during my care of the patient were reviewed by me and considered in my medical decision making (see chart for details).        63 year old female who presents for evaluation of laceration to left index and middle finger that occurred approxi-3 PM.  She was using an Programmer, applications and states that it cut the volar aspect of her 2 fingers.  She is on blood thinners.  She does not know when her last tetanus shot was. Patient is afebrile, non-toxic appearing, sitting comfortably on examination table. Vital signs reviewed and stable.  Patient is neurovascularly intact.  On exam, she has an extensively jagged laceration of the volar aspect of the left index finger as well as the left middle finger.  Will plan for wound care, tetanus update and x-ray.  X-ray reviewed.  Negative for any acute bony abnormality.  Laceration repaired as documented above.  Patient tolerated procedure well.  The laceration on the middle finger easily came together without any difficulty.  On the index finger, there was multiple pieces and the laceration was so extensively jagged, that loose stitches were applied with overlying Steri-Strips. Plan for prophylactic abx given loose approximation. Patient with no known drug allergies.Patient had ample opportunity for questions and discussion. All patient's questions  were answered with full understanding. Strict return precautions discussed. Patient expresses understanding and agreement to plan.    Portions of this note were generated with Scientist, clinical (histocompatibility and immunogenetics). Dictation errors may occur despite best attempts at proofreading.   Final Clinical Impressions(s) / ED Diagnoses   Final diagnoses:  Laceration of finger of left hand without foreign body  without damage to nail, unspecified finger, initial encounter    ED Discharge Orders         Ordered    amoxicillin-clavulanate (AUGMENTIN) 875-125 MG tablet  Every 12 hours     09/22/18 2136           Maxwell Caul, PA-C 09/22/18 2313    Little, Ambrose Finland, MD 09/24/18 1200

## 2018-09-22 NOTE — Discharge Instructions (Signed)
Keep the wound clean and dry for the first 24 hours. After that you may gently clean the wound with soap and water. Make sure to pat dry the wound before covering it with any dressing. You can use topical antibiotic ointment and bandage. Ice and elevate for pain relief.   You can take Tylenol or Ibuprofen as directed for pain. You can alternate Tylenol and Ibuprofen every 4 hours for additional pain relief.   Take antibiotics as directed. Please take all of your antibiotics until finished.  As we discussed, the sutures are dissolvable.  In about 7 days, gently tugged the ends of the stitches, very ready to come out, they will be are easily removable.  If you meet resistance, wait about another day and try again.  He should not stand there more than 10 days.  Monitor closely for any signs of infection. Return to the Emergency Department for any worsening redness/swelling of the area that begins to spread, drainage from the site, worsening pain, fever or any other worsening or concerning symptoms.

## 2019-03-20 DIAGNOSIS — I10 Essential (primary) hypertension: Secondary | ICD-10-CM | POA: Diagnosis not present

## 2019-03-20 DIAGNOSIS — E559 Vitamin D deficiency, unspecified: Secondary | ICD-10-CM | POA: Diagnosis not present

## 2019-03-20 DIAGNOSIS — E785 Hyperlipidemia, unspecified: Secondary | ICD-10-CM | POA: Diagnosis not present

## 2019-03-20 DIAGNOSIS — R739 Hyperglycemia, unspecified: Secondary | ICD-10-CM | POA: Diagnosis not present

## 2019-03-20 DIAGNOSIS — Z23 Encounter for immunization: Secondary | ICD-10-CM | POA: Diagnosis not present

## 2019-06-11 ENCOUNTER — Ambulatory Visit: Payer: Federal, State, Local not specified - PPO | Admitting: Adult Health

## 2019-06-11 ENCOUNTER — Encounter: Payer: Self-pay | Admitting: Adult Health

## 2019-06-11 ENCOUNTER — Telehealth: Payer: Self-pay

## 2019-06-11 NOTE — Telephone Encounter (Signed)
Patient was a no call/no show for their appointment today.   

## 2019-06-12 ENCOUNTER — Ambulatory Visit: Payer: Federal, State, Local not specified - PPO | Admitting: Adult Health

## 2019-06-12 ENCOUNTER — Encounter: Payer: Self-pay | Admitting: Adult Health

## 2019-06-12 ENCOUNTER — Other Ambulatory Visit: Payer: Self-pay

## 2019-06-12 VITALS — BP 121/72 | HR 60 | Temp 97.6°F | Ht 66.0 in | Wt 183.6 lb

## 2019-06-12 DIAGNOSIS — R209 Unspecified disturbances of skin sensation: Secondary | ICD-10-CM

## 2019-06-12 MED ORDER — DULOXETINE HCL 30 MG PO CPEP: 30.0000 mg | ORAL_CAPSULE | Freq: Two times a day (BID) | ORAL | 3 refills | Status: DC

## 2019-06-12 NOTE — Patient Instructions (Signed)
Your Plan:  Continue Cymbalta If your symptoms worsen or you develop new symptoms please let us know.   Thank you for coming to see us at [facility]. I hope we have been able to provide you high quality care today.  You may receive a patient satisfaction survey over the next few weeks. We would appreciate your feedback and comments so that we may continue to improve ourselves and the health of our patients.  

## 2019-06-12 NOTE — Progress Notes (Signed)
PATIENT: Sheila MellowRetta W Allen DOB: 1955/09/10  REASON FOR VISIT: follow up HISTORY FROM: patient  HISTORY OF PRESENT ILLNESS: Today 06/12/19:  Sheila Allen is a 63 year old female with a history of left-sided sensory changes.  She returns today for her yearly follow-up.  Since the initiation of Cymbalta she no longer has the symptoms.  She states that she has done relatively well.  She tried reducing Cymbalta to 1 tablet daily but reports that her symptoms return.  She denies any new symptoms.  She returns today for an evaluation.  HISTORY 06/07/18:  Sheila Allen is a 63 year old female with a history of left-sided sensory changes.  She returns today for her yearly follow-up.  She reports that Cymbalta continues to work well for her.  She denies any recurrence of symptoms.  Overall she continues to do well.  She returns today for an evaluation.  REVIEW OF SYSTEMS: Out of a complete 14 system review of symptoms, the patient complains only of the following symptoms, and all other reviewed systems are negative.  ALLERGIES: No Known Allergies  HOME MEDICATIONS: Outpatient Medications Prior to Visit  Medication Sig Dispense Refill  . ALPRAZolam (XANAX) 0.25 MG tablet Take 0.25 mg by mouth at bedtime as needed for anxiety.    Marland Kitchen. aspirin EC 81 MG EC tablet Take 1 tablet (81 mg total) by mouth daily.    Marland Kitchen. atorvastatin (LIPITOR) 20 MG tablet TK 1 T PO QD  5  . DULoxetine (CYMBALTA) 30 MG capsule Take 1 capsule (30 mg total) by mouth 2 (two) times daily. 180 capsule 3  . estradiol (VIVELLE-DOT) 0.075 MG/24HR Place 1 patch onto the skin 2 (two) times a week. Monday and Thursday      . metoprolol succinate (TOPROL-XL) 50 MG 24 hr tablet Take 50 mg by mouth daily. Take with or immediately following a meal.    . ondansetron (ZOFRAN) 4 MG tablet Take 1 tablet (4 mg total) by mouth every 6 (six) hours as needed for nausea or vomiting. 6 tablet 1  . zolpidem (AMBIEN) 10 MG tablet Take 10 mg by mouth at  bedtime as needed for sleep.    Marland Kitchen. amoxicillin-clavulanate (AUGMENTIN) 875-125 MG tablet Take 1 tablet by mouth every 12 (twelve) hours. 14 tablet 0  . HYDROcodone-acetaminophen (NORCO) 5-325 MG tablet Take 1 tablet by mouth every 4 (four) hours as needed for moderate pain. 12 tablet 0   No facility-administered medications prior to visit.     PAST MEDICAL HISTORY: Past Medical History:  Diagnosis Date  . Complication of anesthesia   . Disturbance of skin sensation 11/07/2013  . Hip pain, left   . History of kidney stones   . Hypertension   . Kidney stone    lithotripsy  . PONV (postoperative nausea and vomiting)     PAST SURGICAL HISTORY: Past Surgical History:  Procedure Laterality Date  . ABDOMINAL HYSTERECTOMY    . CHOLECYSTECTOMY  12/2017  . EXTRACORPOREAL SHOCK WAVE LITHOTRIPSY Right 01/02/2018   Procedure: RIGHT EXTRACORPOREAL SHOCK WAVE LITHOTRIPSY (ESWL);  Surgeon: Marcine Matarahlstedt, Stephen, MD;  Location: [facility];  Service: Urology;  Laterality: Right;  . EXTRACORPOREAL SHOCK WAVE LITHOTRIPSY Left 07/20/2018   Procedure: EXTRACORPOREAL SHOCK WAVE LITHOTRIPSY (ESWL);  Surgeon: Bjorn PippinWrenn, John, MD;  Location: [facility];  Service: Urology;  Laterality: Left;  . HIP SURGERY Left    bursectomy  . OVARIAN CYST REMOVAL    . TONSILLECTOMY      FAMILY HISTORY: Family History  Problem Relation Age  of Onset  . Heart disease Mother   . Hypertension Mother   . Bladder Cancer Mother   . Stroke Mother   . Heart disease Father   . Hypertension Father   . Stroke Father   . Hypertension Sister   . Hypertension Son   . Hypertension Brother   . Hypertension Sister   . Hypertension Sister   . Hypertension Brother   . Migraines Neg Hx   . Seizures Neg Hx     SOCIAL HISTORY: Social History   Socioeconomic History  . Marital status: Married    Spouse name: Sheila Allen  . Number of children: 2  . Years of education: 7  . Highest education level: Not on file  Occupational History  .  Occupation: Retired    Comment: Comptroller  . Occupation: PT substitute Runner, broadcasting/film/video    Comment: Civil Service fast streamer  Social Needs  . Financial resource strain: Not on file  . Food insecurity    Worry: Not on file    Inability: Not on file  . Transportation needs    Medical: Not on file    Non-medical: Not on file  Tobacco Use  . Smoking status: Never Smoker  . Smokeless tobacco: Never Used  Substance and Sexual Activity  . Alcohol use: Yes    Alcohol/week: 0.0 standard drinks    Comment: OCASIONAL  . Drug use: No  . Sexual activity: Not on file  Lifestyle  . Physical activity    Days per week: Not on file    Minutes per session: Not on file  . Stress: Not on file  Relationships  . Social Musician on phone: Not on file    Gets together: Not on file    Attends religious service: Not on file    Active member of club or organization: Not on file    Attends meetings of clubs or organizations: Not on file    Relationship status: Not on file  . Intimate partner violence    Fear of current or ex partner: Not on file    Emotionally abused: Not on file    Physically abused: Not on file    Forced sexual activity: Not on file  Other Topics Concern  . Not on file  Social History Narrative   Patient lives at home with family.. 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   Right handed.   Education 12 th grade.   Caffeine none       PHYSICAL EXAM  Vitals:   06/12/19 1346  BP: 121/72  Pulse: 60  Temp: 97.6 F (36.4 C)  Weight: 183 lb 9.6 oz (83.3 kg)  Height: 5\' 6"  (1.676 m)   Body mass index is 29.63 kg/m.  Generalized: Well developed, in no acute distress   Neurological examination  Mentation: Alert oriented to time, place, history taking. Follows all commands speech and language fluent Cranial nerve II-XII: Pupils were equal round reactive to light. Extraocular movements were full, visual field were full on  confrontational test. Head turning and shoulder shrug  were normal and symmetric. Motor: The motor testing reveals 5 over 5 strength of all 4 extremities. Good symmetric motor tone is noted throughout.  Sensory: Sensory testing is intact to soft touch on all 4 extremities. No evidence of extinction is noted.  Coordination: Cerebellar testing reveals good finger-nose-finger and heel-to-shin bilaterally.  Gait and station: Gait is normal.   Reflexes: Deep tendon  reflexes are symmetric and normal bilaterally.   DIAGNOSTIC DATA (LABS, IMAGING, TESTING) - I reviewed patient records, labs, notes, testing and imaging myself where available.  Lab Results  Component Value Date   WBC 9.2 07/07/2013   HGB 12.8 07/07/2013   HCT 38.7 07/07/2013   MCV 87.6 07/07/2013   PLT 366 07/07/2013      Component Value Date/Time   NA 140 07/07/2013 0337   K 3.1 (L) 07/07/2013 0337   CL 99 07/07/2013 0337   CO2 30 07/07/2013 0337   GLUCOSE 121 (H) 07/07/2013 0337   BUN 17 07/07/2013 0337   CREATININE 0.65 07/07/2013 0941   CALCIUM 9.1 07/07/2013 0337   PROT 7.1 07/15/2011 1410   ALBUMIN 3.7 07/15/2011 1410   AST 23 07/15/2011 1410   ALT 16 01/01/2014 0739   ALKPHOS 63 07/15/2011 1410   BILITOT 0.6 07/15/2011 1410   GFRNONAA >90 07/07/2013 0941   GFRAA >90 07/07/2013 0941   Lab Results  Component Value Date   CHOL 216 (H) 01/01/2014   HDL 50.00 01/01/2014   LDLCALC 138 (H) 01/01/2014   TRIG 142.0 01/01/2014   CHOLHDL 4 01/01/2014   Lab Results  Component Value Date   HGBA1C 5.6 07/07/2013   Lab Results  Component Value Date   [id number] 621 02/20/2014   No results found for: TSH    ASSESSMENT AND PLAN 63 y.o. year old female  has a past medical history of Complication of anesthesia, Disturbance of skin sensation (11/07/2013), Hip pain, left, History of kidney stones, Hypertension, Kidney stone, and PONV (postoperative nausea and vomiting). here with :  1.  Left-sided sensory  changes  -Continue Cymbalta 30 mg twice a day -Advised if symptoms worsen or she develops new symptoms she should let us know -Follow-up in 1 year or sooner if needed    I spent 15 minutes with the patient. 50% of this time was spent reviewing plan of care   Ward Givens, MSN, NP-C 06/12/2019, 1:45 PM [facility] [address] [place], [place] [address] [phone]

## 2019-06-12 NOTE — Progress Notes (Signed)
I have read the note, and I agree with the clinical assessment and plan.  Sheila Allen   

## 2019-07-05 DIAGNOSIS — Z20828 Contact with and (suspected) exposure to other viral communicable diseases: Secondary | ICD-10-CM | POA: Diagnosis not present

## 2019-07-19 DIAGNOSIS — Z20828 Contact with and (suspected) exposure to other viral communicable diseases: Secondary | ICD-10-CM | POA: Diagnosis not present

## 2019-08-01 DIAGNOSIS — Z20822 Contact with and (suspected) exposure to covid-19: Secondary | ICD-10-CM | POA: Diagnosis not present

## 2019-08-08 ENCOUNTER — Other Ambulatory Visit: Payer: Self-pay | Admitting: Adult Health

## 2019-10-02 DIAGNOSIS — Z124 Encounter for screening for malignant neoplasm of cervix: Secondary | ICD-10-CM | POA: Diagnosis not present

## 2019-10-02 DIAGNOSIS — Z01419 Encounter for gynecological examination (general) (routine) without abnormal findings: Secondary | ICD-10-CM | POA: Diagnosis not present

## 2019-12-18 DIAGNOSIS — J029 Acute pharyngitis, unspecified: Secondary | ICD-10-CM | POA: Diagnosis not present

## 2020-01-01 ENCOUNTER — Other Ambulatory Visit: Payer: Self-pay | Admitting: Urology

## 2020-01-04 NOTE — Progress Notes (Signed)
Patient to arrive at 0600 on 01/10/2020. History and medications reviewed. Will take BP medication with sip of water in am. NPO after MN. Instructed to stop ASA 72 hours prior to procedure. Pre-procedure instructions given. Driver secured.

## 2020-01-10 ENCOUNTER — Ambulatory Visit (HOSPITAL_BASED_OUTPATIENT_CLINIC_OR_DEPARTMENT_OTHER)
Admission: RE | Admit: 2020-01-10 | Discharge: 2020-01-10 | Disposition: A | Discharge status: Home / Self Care | Payer: Medicare Other | Attending: Urology | Admitting: Urology

## 2020-01-10 ENCOUNTER — Ambulatory Visit (HOSPITAL_COMMUNITY): Payer: Medicare Other

## 2020-01-10 ENCOUNTER — Encounter (HOSPITAL_BASED_OUTPATIENT_CLINIC_OR_DEPARTMENT_OTHER)
Admission: RE | Disposition: A | Discharge status: Home / Self Care | Payer: Self-pay | Source: Home / Self Care | Attending: Urology

## 2020-01-10 ENCOUNTER — Encounter (HOSPITAL_BASED_OUTPATIENT_CLINIC_OR_DEPARTMENT_OTHER): Payer: Self-pay | Admitting: Urology

## 2020-01-10 ENCOUNTER — Other Ambulatory Visit: Payer: Self-pay

## 2020-01-10 DIAGNOSIS — Z87442 Personal history of urinary calculi: Secondary | ICD-10-CM | POA: Diagnosis not present

## 2020-01-10 DIAGNOSIS — Z8249 Family history of ischemic heart disease and other diseases of the circulatory system: Secondary | ICD-10-CM | POA: Diagnosis not present

## 2020-01-10 DIAGNOSIS — Z90710 Acquired absence of both cervix and uterus: Secondary | ICD-10-CM | POA: Diagnosis not present

## 2020-01-10 DIAGNOSIS — I10 Essential (primary) hypertension: Secondary | ICD-10-CM | POA: Insufficient documentation

## 2020-01-10 DIAGNOSIS — Z79899 Other long term (current) drug therapy: Secondary | ICD-10-CM | POA: Diagnosis not present

## 2020-01-10 DIAGNOSIS — Z9049 Acquired absence of other specified parts of digestive tract: Secondary | ICD-10-CM | POA: Diagnosis not present

## 2020-01-10 DIAGNOSIS — N201 Calculus of ureter: Secondary | ICD-10-CM | POA: Diagnosis present

## 2020-01-10 DIAGNOSIS — Z7982 Long term (current) use of aspirin: Secondary | ICD-10-CM | POA: Diagnosis not present

## 2020-01-10 DIAGNOSIS — Z823 Family history of stroke: Secondary | ICD-10-CM | POA: Insufficient documentation

## 2020-01-10 DIAGNOSIS — Z8052 Family history of malignant neoplasm of bladder: Secondary | ICD-10-CM | POA: Diagnosis not present

## 2020-01-10 HISTORY — PX: EXTRACORPOREAL SHOCK WAVE LITHOTRIPSY: SHX1557

## 2020-01-10 SURGERY — LITHOTRIPSY, ESWL
Anesthesia: LOCAL | Laterality: Left

## 2020-01-10 MED ORDER — DIAZEPAM 5 MG PO TABS
10.0000 mg | ORAL_TABLET | ORAL | Status: AC
  Administered 2020-01-10: 10 mg via ORAL

## 2020-01-10 MED ORDER — DIPHENHYDRAMINE HCL 25 MG PO CAPS
25.0000 mg | ORAL_CAPSULE | ORAL | Status: AC
  Administered 2020-01-10: 25 mg via ORAL

## 2020-01-10 MED ORDER — SODIUM CHLORIDE 0.9 % IV SOLN: INTRAVENOUS | Status: DC

## 2020-01-10 MED ORDER — OXYCODONE-ACETAMINOPHEN 5-325 MG PO TABS: 1.0000 | ORAL_TABLET | Freq: Four times a day (QID) | ORAL | 0 refills | Status: DC | PRN

## 2020-01-10 MED ORDER — DIAZEPAM 5 MG PO TABS
ORAL_TABLET | ORAL | Status: AC
  Filled 2020-01-10: qty 2

## 2020-01-10 MED ORDER — CIPROFLOXACIN HCL 500 MG PO TABS
500.0000 mg | ORAL_TABLET | ORAL | Status: AC
  Administered 2020-01-10: 500 mg via ORAL

## 2020-01-10 MED ORDER — CIPROFLOXACIN HCL 500 MG PO TABS
ORAL_TABLET | ORAL | Status: AC
  Filled 2020-01-10: qty 1

## 2020-01-10 MED ORDER — DIPHENHYDRAMINE HCL 25 MG PO CAPS
ORAL_CAPSULE | ORAL | Status: AC
  Filled 2020-01-10: qty 1

## 2020-01-10 NOTE — H&P (Signed)
Sheila Allen is an 64 y.o. female.    Chief Complaint: PRE-LEFT Shockwave LIthotripsy  HPI:   1 - Recurrent Urolithiasis -  Pre 2021 - SWL x several 12/2019 - Left distal 1/3 stoen 49mm + 15mm (side by side) on CT, minimal hydro. UCX negative. She adamantly wants SWL despite distal location. Lower portion of SI joint on CT.  Today "Sheila Allen" is seen to proceed with left SWL. No interval fevers.  KUB today with antegrade progression to UVJ area. Also "feels like it has moved lower"  Past Medical History:  Diagnosis Date  . Complication of anesthesia   . Disturbance of skin sensation 11/07/2013  . Hip pain, left   . History of kidney stones   . Hypertension   . Kidney stone    lithotripsy  . PONV (postoperative nausea and vomiting)     Past Surgical History:  Procedure Laterality Date  . ABDOMINAL HYSTERECTOMY    . CHOLECYSTECTOMY  12/2017  . EXTRACORPOREAL SHOCK WAVE LITHOTRIPSY Right 01/02/2018   Procedure: RIGHT EXTRACORPOREAL SHOCK WAVE LITHOTRIPSY (ESWL);  Surgeon: Marcine Matar, MD;  Location: [facility];  Service: Urology;  Laterality: Right;  . EXTRACORPOREAL SHOCK WAVE LITHOTRIPSY Left 07/20/2018   Procedure: EXTRACORPOREAL SHOCK WAVE LITHOTRIPSY (ESWL);  Surgeon: Bjorn Pippin, MD;  Location: [facility];  Service: Urology;  Laterality: Left;  . HIP SURGERY Left    bursectomy  . OVARIAN CYST REMOVAL    . TONSILLECTOMY      Family History  Problem Relation Age of Onset  . Heart disease Mother   . Hypertension Mother   . Bladder Cancer Mother   . Stroke Mother   . Heart disease Father   . Hypertension Father   . Stroke Father   . Hypertension Sister   . Hypertension Son   . Hypertension Brother   . Hypertension Sister   . Hypertension Sister   . Hypertension Brother   . Migraines Neg Hx   . Seizures Neg Hx    Social History:  reports that she has never smoked. She has never used smokeless tobacco. She reports current alcohol use. She reports that she does not use  drugs.  Allergies: No Known Allergies  Medications Prior to Admission  Medication Sig Dispense Refill  . ALPRAZolam (XANAX) 0.25 MG tablet Take 0.25 mg by mouth at bedtime as needed for anxiety.    Marland Kitchen aspirin EC 81 MG EC tablet Take 1 tablet (81 mg total) by mouth daily.    Marland Kitchen atorvastatin (LIPITOR) 20 MG tablet TK 1 T PO QD  5  . DULoxetine (CYMBALTA) 30 MG capsule Take 1 capsule (30 mg total) by mouth 2 (two) times daily. 180 capsule 3  . estradiol (VIVELLE-DOT) 0.075 MG/24HR Place 1 patch onto the skin 2 (two) times a week. Monday and Thursday      . HYDROcodone-acetaminophen (NORCO/VICODIN) 5-325 MG tablet Take 1 tablet by mouth every 6 (six) hours as needed for moderate pain.    . metoprolol succinate (TOPROL-XL) 50 MG 24 hr tablet Take 50 mg by mouth daily. Take with or immediately following a meal.    . Multiple Vitamin (MULTIVITAMIN WITH MINERALS) TABS tablet Take 1 tablet by mouth daily. OTC    . zolpidem (AMBIEN) 10 MG tablet Take 10 mg by mouth at bedtime as needed for sleep.      No results found for this or any previous visit (from the past 48 hour(s)). No results found.  Review of Systems  Constitutional: Negative  for fever.  Genitourinary: Positive for pelvic pain.  All other systems reviewed and are negative.   Blood pressure (!) 149/90, pulse 74, temperature 97.9 F (36.6 C), temperature source Oral, resp. rate 15, height 5\' 7"  (1.702 m), weight 78.9 kg, SpO2 98 %. Physical Exam Vitals reviewed.  HENT:     Head: Normocephalic.     Right Ear: Tympanic membrane normal.     Nose: Nose normal.     Mouth/Throat:     Mouth: Mucous membranes are moist.  Cardiovascular:     Rate and Rhythm: Normal rate.     Pulses: Normal pulses.  Pulmonary:     Effort: Pulmonary effort is normal.  Abdominal:     General: Abdomen is flat.  Genitourinary:    Comments: Very mild left CVAT at present.  Musculoskeletal:     Cervical back: Normal range of motion.  Skin:    General:  Skin is warm.  Neurological:     General: No focal deficit present.     Mental Status: She is alert.      Assessment/Plan  Proceed as planned with LEFT SWL. Risks, benefits, expected peri-treatment corse discussed previously and reiterated today.   , MD 01/10/2020, 6:46 AM

## 2020-01-10 NOTE — Brief Op Note (Signed)
01/10/2020  8:35 AM  PATIENT:  Sheila Allen  64 y.o. female  PRE-OPERATIVE DIAGNOSIS:  LEFT URETERAL CALCULUS  POST-OPERATIVE DIAGNOSIS:  * No post-op diagnosis entered *  PROCEDURE:  Procedure(s) with comments: EXTRACORPOREAL SHOCK WAVE LITHOTRIPSY (ESWL) (Left) - NEEDS 75 MIN  SURGEON:  Surgeon(s) and Role:    * Alexis Frock, MD - Primary  PHYSICIAN ASSISTANT:   ASSISTANTS: none   ANESTHESIA:   MAC  EBL:  minimal  BLOOD ADMINISTERED:none  DRAINS: none   LOCAL MEDICATIONS USED:  NONE  SPECIMEN:  No Specimen  DISPOSITION OF SPECIMEN:  N/A  COUNTS:  YES  TOURNIQUET:  * No tourniquets in log *  DICTATION: .Note written in paper chart  PLAN OF CARE: Discharge to home after PACU  PATIENT DISPOSITION:  Short Stay   Delay start of Pharmacological VTE agent (>24hrs) due to surgical blood loss or risk of bleeding: yes

## 2020-01-10 NOTE — Discharge Instructions (Signed)
1 - You may have urinary urgency (bladder spasms), pass small stone fragments, and bloody urine on / off for up to 2 weeks. This is normal. ° °2 - Call MD or go to ER for fever >102, severe pain / nausea / vomiting not relieved by medications, or acute change in medical status ° °

## 2020-01-11 ENCOUNTER — Encounter (HOSPITAL_BASED_OUTPATIENT_CLINIC_OR_DEPARTMENT_OTHER): Payer: Self-pay | Admitting: Urology

## 2020-02-01 ENCOUNTER — Other Ambulatory Visit: Payer: Self-pay | Admitting: Urology

## 2020-02-04 ENCOUNTER — Other Ambulatory Visit (HOSPITAL_COMMUNITY)
Admission: RE | Admit: 2020-02-04 | Discharge: 2020-02-04 | Disposition: A | Discharge status: Home / Self Care | Payer: Medicare Other | Source: Ambulatory Visit | Attending: Urology | Admitting: Urology

## 2020-02-04 DIAGNOSIS — Z20822 Contact with and (suspected) exposure to covid-19: Secondary | ICD-10-CM | POA: Diagnosis not present

## 2020-02-04 DIAGNOSIS — Z01812 Encounter for preprocedural laboratory examination: Secondary | ICD-10-CM | POA: Diagnosis present

## 2020-02-04 LAB — SARS CORONAVIRUS 2 (TAT 6-24 HRS): SARS Coronavirus 2: NEGATIVE

## 2020-02-04 NOTE — Progress Notes (Signed)
Pre op phone call completed.  Pt has stopped Aspirin.  She is aware she can take her BP medicine the morning of the surgery.  Pt will quarantine after COVID test.

## 2020-02-06 NOTE — H&P (Signed)
I have kidney stones.  HPI: Sheila Allen is a 64 year-old female established patient who is here for renal calculi.  Patient history of stones to 2013. She passed a stone in 2018 and CT scan in January 2018 showed bilateral stones largest in the left kidney with a 2-4 mm right distal stone. F/u US negative.   She underwent right ESWL for a right prox stone Jul 2019. This resolved.   She developed left flank pain 4 days ago with N/V. The pain has moved to the LLQ. No stone passage. 3-10 rbc's. She is staying hydrated. No further emesis.    Interval 12/27/19: Patient with above-noted history. She underwent left ESWL in January 2020. She presents today with complaints of left lower quadrant pain. She states this is been ongoing for the past few weeks. She describes the pain as intermittent, but states she has been having more frequent episodes of pain. She has been using Tylenol on p.r.n. basis which has been somewhat beneficial. She denies associated nausea or vomiting. She states she has noted gross hematuria. She denies dysuria or exacerbation of voiding symptoms. No complaints of fevers or chills. She did recently complete antibiotic therapy for strep throat.   01/28/20: Patient with above-noted history. She is status post left ESWL on 07/08 for left distal ureteral calculi. Overall, she is doing well following her procedure. She has passed some stone material in the interim. She denies any current left flank or abdominal pain. She states that voiding symptoms have returned to baseline. She denies gross hematuria or dysuria. No complaints of fever, chills, nausea, or vomiting. She states that she has been having some mild, intermittent episodes of right flank pain. This has not been terribly bothersome at this time.   The problem is on the left side. She first stated noticing pain on approximately 12/09/2017. This is not her first kidney stone. She is currently having back pain and nausea. She denies  having flank pain, groin pain, vomiting, fever, and chills.     ALLERGIES: No Allergies    MEDICATIONS: Metoprolol Succinate 50 mg tablet, extended release 24 hr  Tamsulosin Hcl 0.4 mg capsule 1 capsule PO Daily  Alprazolam 0.25 mg tablet tablet PRN  Aspirin Ec 81 mg tablet, delayed release  Duloxetine Hcl 30 mg capsule,delayed release  Estradiol (Twice Weekly) 0.075 mg/24 hour patch, transdermal semiweekly  Hydrocodone-Acetaminophen 5 mg-325 mg tablet 1 tablet PO Q 6 H PRN For severe pain  Ondansetron Odt 4 mg tablet,disintegrating 1 tablet PO Q 8 H PRN  Zolpidem Tartrate 10 mg tablet     GU PSH: ESWL, Left - 01/10/2020, Left - 2020, 2019, 2014 Hysterectomy Unilat SO - 2014       PSH Notes: Lithotripsy, Excision Of Ischial Bursa, Hysterectomy   NON-GU PSH: Cholecystectomy (laparoscopic) Remove Ischial Bursa - 2014     GU PMH: Flank Pain - 12/27/2019 Renal calculus - 12/27/2019, - 2020, - 02/17/2018, - 2019, - 2019, - 2019, - 2019, Nephrolithiasis, - 2014 Dysuria - 2019 Ureteral calculus - 2019 Renal cyst - 2019, Renal cyst, acquired, - 2014 Other microscopic hematuria - 2018 LLQ pain, Abdominal pain, LLQ (left lower quadrant) - 2016      PMH Notes:  2012-09-20 13:56:06 - Note: Arthritis   NON-GU PMH: Bacteriuria, Bacteriuria - 2016, Bacteriuria, asymptomatic, - 2014 Encounter for general adult medical examination without abnormal findings, Encounter for preventive health examination - 2016 Personal history of other diseases of the circulatory system, History of hypertension -  2014    FAMILY HISTORY: Bladder Cancer - Mother Death In The Family Father - Runs In Family Death In The Family Mother - Runs In Family Family Health Status Children is 1 daughter and 1 - Runs In Family Heart Disease - Runs In Family nephrolithiasis - Runs In Family Prostate Cancer - Runs In Family   SOCIAL HISTORY: Marital Status: Married Preferred Language: English; Ethnicity: Not Hispanic Or  Latino; Race: White Current Smoking Status: Patient has never smoked.   Tobacco Use Assessment Completed: Used Tobacco in last 30 days? Social Drinker.  Drinks 1 caffeinated drink per day.     Notes: Alcohol Use, Never A Smoker, Caffeine Use, Marital History - Currently Married, Retired From Work   REVIEW OF SYSTEMS:    GU Review Female:   Patient denies frequent urination, hard to postpone urination, burning /pain with urination, get up at night to urinate, leakage of urine, stream starts and stops, trouble starting your stream, have to strain to urinate, and being pregnant.  Gastrointestinal (Upper):   Patient denies nausea, vomiting, and indigestion/ heartburn.  Gastrointestinal (Lower):   Patient denies diarrhea and constipation.  Constitutional:   Patient denies fever, night sweats, weight loss, and fatigue.  Skin:   Patient denies skin rash/ lesion and itching.  Eyes:   Patient denies blurred vision and double vision.  Ears/ Nose/ Throat:   Patient denies sore throat and sinus problems.  Hematologic/Lymphatic:   Patient denies swollen glands and easy bruising.  Cardiovascular:   Patient denies leg swelling and chest pains.  Respiratory:   Patient denies cough and shortness of breath.  Endocrine:   Patient denies excessive thirst.  Musculoskeletal:   Patient denies back pain and joint pain.  Neurological:   Patient denies headaches and dizziness.  Psychologic:   Patient denies depression and anxiety.   VITAL SIGNS:      01/28/2020 09:16 AM  BP 154/85 mmHg  Pulse 63 /min  Temperature 97.3 F / 36.2 C   MULTI-SYSTEM PHYSICAL EXAMINATION:    Constitutional: Well-nourished. No physical deformities. Normally developed. Good grooming.  Respiratory: No labored breathing, no use of accessory muscles.   Cardiovascular: Normal temperature, normal extremity pulses, no swelling, no varicosities.  Neurologic / Psychiatric: Oriented to time, oriented to place, oriented to person. No  depression, no anxiety, no agitation.  Musculoskeletal: Normal gait and station of head and neck.     Complexity of Data:  Records Review:   Previous Patient Records  Urine Test Review:   Urinalysis, Urine Culture  X-Ray Review: KUB: Reviewed Films.  C.T. Abdomen/Pelvis: Reviewed Films. Reviewed Report.     PROCEDURES:         KUB - [id number]  A single view of the abdomen is obtained. Bilateral renal shadows are difficult to fully visualize today due to overlying bowel gas pattern. I do not appreciate any obvious opacities noted along the expected anatomical course of the left ureter. However, along the expected anatomical course of the right ureter there is a new opacity at the level of L3 that is concerning for potential proximal ureteral calculus.      Patient confirmed No Neulasta OnPro Device.             Renal Ultrasound - [id number]  Right Kidney: Length: 10.3 cm Depth:6.5 cm Cortical Width: 1.1 cm Width:5.9 cm  Left Kidney: Length:10.5 cm Depth: 6.7 cm Cortical Width:1.0 cm Width:5.3 cm  Left Kidney/Ureter:  There are several small, non obstructing lower pole stones noted  approx. 4 mm each. There is a simple cortical upper pole cyst 1.0 cm.  Right Kidney/Ureter:  There is a non obstructing lower pole 2.6 mm calc noted as well as a lower pole cortical 7.2 mm calc.  Bladder:  PVR 19.ml      . Patient confirmed No Neulasta OnPro Device.           Urinalysis w/Scope Dipstick Dipstick Cont'd Micro  Color: Yellow Bilirubin: Neg mg/dL WBC/hpf: 6 - 58/NID  Appearance: Clear Ketones: Neg mg/dL RBC/hpf: 3 - 78/EUM  Specific Gravity: >1.030 Blood: 2+ ery/uL Bacteria: Rare (0-9/hpf)  pH: 6.0 Protein: Trace mg/dL Cystals: Ca Oxalate  Glucose: Neg mg/dL Urobilinogen: 0.2 mg/dL Casts: NS (Not Seen)    Nitrites: Neg Trichomonas: Not Present    Leukocyte Esterase: 1+ leu/uL Mucous: Present      Epithelial Cells: 0 - 5/hpf      Yeast: NS (Not Seen)      Sperm: Not Present    Notes:  Microscopic not concentrated.    ASSESSMENT:      ICD-10 Details  1 GU:   Renal calculus - N20.0   2   Flank Pain - R10.84    PLAN:           Orders Labs Urine Culture  X-Rays: Renal Ultrasound  X-Ray Notes: History:  Hematuria: Yes/No  Patient to see MD after exam: Yes/No  Previous exam: CT / IVP/ US/ KUB/ None  When:  Where:  Diabetic: Yes/ No  BUN/ Creatinine:  Date of last BUN Creatinine:  Weight in pounds:  Allergy- IV Contrast: Yes/ No  Conflicting diabetic meds: Yes/ No  Diabetic Meds:  Prior Authorization #:            Schedule         Document Letter(s):  Created for Patient: Clinical Summary         Notes:   Precautionary culture sent today. On KUB imaging, bilateral renal shadows are difficult to fully visualize today due to overlying bowel gas pattern. I do not appreciate any obvious opacities noted along the expected anatomical course of the left ureter. However, along the expected anatomical course of the right ureter there is a new opacity at the level of L3 that is concerning for potential proximal ureteral calculus. Renal ultrasound imaging is reassuring today without overt hydronephrosis. Ultimately, I am going to review today's imaging studies with her urologist for recommendations moving forward. Historically, she has not been successful with medical expulsive therapy. I did recommend that she resume tamsulosin and start straining her urine again. I encouraged ongoing hydration. I will keep her updated regarding follow-up recommendations. Return precautions reviewed in the interim for fevers or refractory pain, nausea, or vomiting. She does have medications at home on hand for pain and nausea, if needed.

## 2020-02-07 ENCOUNTER — Encounter (HOSPITAL_BASED_OUTPATIENT_CLINIC_OR_DEPARTMENT_OTHER): Payer: Self-pay | Admitting: Urology

## 2020-02-07 ENCOUNTER — Encounter (HOSPITAL_BASED_OUTPATIENT_CLINIC_OR_DEPARTMENT_OTHER)
Admission: RE | Disposition: A | Discharge status: Home / Self Care | Payer: Self-pay | Source: Home / Self Care | Attending: Urology

## 2020-02-07 ENCOUNTER — Ambulatory Visit (HOSPITAL_BASED_OUTPATIENT_CLINIC_OR_DEPARTMENT_OTHER)
Admission: RE | Admit: 2020-02-07 | Discharge: 2020-02-07 | Disposition: A | Discharge status: Home / Self Care | Payer: Medicare Other | Attending: Urology | Admitting: Urology

## 2020-02-07 ENCOUNTER — Other Ambulatory Visit: Payer: Self-pay

## 2020-02-07 ENCOUNTER — Ambulatory Visit (HOSPITAL_COMMUNITY): Payer: Medicare Other

## 2020-02-07 DIAGNOSIS — I10 Essential (primary) hypertension: Secondary | ICD-10-CM | POA: Insufficient documentation

## 2020-02-07 DIAGNOSIS — Z7982 Long term (current) use of aspirin: Secondary | ICD-10-CM | POA: Insufficient documentation

## 2020-02-07 DIAGNOSIS — M1990 Unspecified osteoarthritis, unspecified site: Secondary | ICD-10-CM | POA: Diagnosis not present

## 2020-02-07 DIAGNOSIS — N200 Calculus of kidney: Secondary | ICD-10-CM | POA: Insufficient documentation

## 2020-02-07 DIAGNOSIS — N201 Calculus of ureter: Secondary | ICD-10-CM

## 2020-02-07 DIAGNOSIS — Z87442 Personal history of urinary calculi: Secondary | ICD-10-CM | POA: Insufficient documentation

## 2020-02-07 DIAGNOSIS — Z79899 Other long term (current) drug therapy: Secondary | ICD-10-CM | POA: Insufficient documentation

## 2020-02-07 HISTORY — PX: EXTRACORPOREAL SHOCK WAVE LITHOTRIPSY: SHX1557

## 2020-02-07 SURGERY — LITHOTRIPSY, ESWL
Anesthesia: LOCAL | Laterality: Right

## 2020-02-07 MED ORDER — DIPHENHYDRAMINE HCL 25 MG PO CAPS
25.0000 mg | ORAL_CAPSULE | ORAL | Status: AC
  Administered 2020-02-07: 25 mg via ORAL

## 2020-02-07 MED ORDER — SODIUM CHLORIDE 0.9 % IV SOLN: INTRAVENOUS | Status: DC

## 2020-02-07 MED ORDER — DIAZEPAM 5 MG PO TABS
10.0000 mg | ORAL_TABLET | ORAL | Status: AC
  Administered 2020-02-07: 10 mg via ORAL

## 2020-02-07 MED ORDER — DIAZEPAM 5 MG PO TABS
ORAL_TABLET | ORAL | Status: AC
  Filled 2020-02-07: qty 2

## 2020-02-07 MED ORDER — SODIUM CHLORIDE 0.9% FLUSH: 3.0000 mL | Freq: Two times a day (BID) | INTRAVENOUS | Status: DC

## 2020-02-07 MED ORDER — OXYCODONE-ACETAMINOPHEN 5-325 MG PO TABS: 1.0000 | ORAL_TABLET | Freq: Four times a day (QID) | ORAL | 0 refills | Status: AC | PRN

## 2020-02-07 MED ORDER — DIPHENHYDRAMINE HCL 25 MG PO CAPS
ORAL_CAPSULE | ORAL | Status: AC
  Filled 2020-02-07: qty 1

## 2020-02-07 MED ORDER — CIPROFLOXACIN HCL 500 MG PO TABS
500.0000 mg | ORAL_TABLET | ORAL | Status: AC
  Administered 2020-02-07: 500 mg via ORAL

## 2020-02-07 MED ORDER — CIPROFLOXACIN HCL 500 MG PO TABS
ORAL_TABLET | ORAL | Status: AC
  Filled 2020-02-07: qty 1

## 2020-02-07 NOTE — Discharge Instructions (Signed)
Lithotripsy, Care After This sheet gives you information about how to care for yourself after your procedure. Your health care provider may also give you more specific instructions. If you have problems or questions, contact your health care provider. What can I expect after the procedure? After the procedure, it is common to have:  Some blood in your urine. This should only last for a few days.  Soreness in your back, sides, or upper abdomen for a few days.  Blotches or bruises on your back where the pressure wave entered the skin.  Pain, discomfort, or nausea when pieces (fragments) of the kidney stone move through the tube that carries urine from the kidney to the bladder (ureter). Stone fragments may pass soon after the procedure, but they may continue to pass for up to 4-8 weeks. ? If you have severe pain or nausea, contact your health care provider. This may be caused by a large stone that was not broken up, and this may mean that you need more treatment.  Some pain or discomfort during urination.  Some pain or discomfort in the lower abdomen or (in men) at the base of the penis. Follow these instructions at home: Medicines  Take over-the-counter and prescription medicines only as told by your health care provider.  If you were prescribed an antibiotic medicine, take it as told by your health care provider. Do not stop taking the antibiotic even if you start to feel better.  Do not drive for 24 hours if you were given a medicine to help you relax (sedative).  Do not drive or use heavy machinery while taking prescription pain medicine. Eating and drinking      Drink enough water and fluids to keep your urine clear or pale yellow. This helps any remaining pieces of the stone to pass. It can also help prevent new stones from forming.  Eat plenty of fresh fruits and vegetables.  Follow instructions from your health care provider about eating and drinking restrictions. You may be  instructed: ? To reduce how much salt (sodium) you eat or drink. Check ingredients and nutrition facts on packaged foods and beverages. ? To reduce how much meat you eat.  Eat the recommended amount of calcium for your age and gender. Ask your health care provider how much calcium you should have. General instructions  Get plenty of rest.  Most people can resume normal activities 1-2 days after the procedure. Ask your health care provider what activities are safe for you.  Your health care provider may direct you to lie in a certain position (postural drainage) and tap firmly (percuss) over your kidney area to help stone fragments pass. Follow instructions as told by your health care provider.  If directed, strain all urine through the strainer that was provided by your health care provider. ? Keep all fragments for your health care provider to see. Any stones that are found may be sent to a medical lab for examination. The stone may be as small as a grain of salt.  Keep all follow-up visits as told by your health care provider. This is important. Contact a health care provider if:  You have pain that is severe or does not get better with medicine.  You have nausea that is severe or does not go away.  You have blood in your urine longer than your health care provider told you to expect.  You have more blood in your urine.  You have pain during urination that does   not go away.  You urinate more frequently than usual and this does not go away.  You develop a rash or any other possible signs of an allergic reaction. Get help right away if:  You have severe pain in your back, sides, or upper abdomen.  You have severe pain while urinating.  Your urine is very dark red.  You have blood in your stool (feces).  You cannot pass any urine at all.  You feel a strong urge to urinate after emptying your bladder.  You have a fever or chills.  You develop shortness of breath,  difficulty breathing, or chest pain.  You have severe nausea that leads to persistent vomiting.  You faint. Summary  After this procedure, it is common to have some pain, discomfort, or nausea when pieces (fragments) of the kidney stone move through the tube that carries urine from the kidney to the bladder (ureter). If this pain or nausea is severe, however, you should contact your health care provider.  Most people can resume normal activities 1-2 days after the procedure. Ask your health care provider what activities are safe for you.  Drink enough water and fluids to keep your urine clear or pale yellow. This helps any remaining pieces of the stone to pass, and it can help prevent new stones from forming.  If directed, strain your urine and keep all fragments for your health care provider to see. Fragments or stones may be as small as a grain of salt.  Get help right away if you have severe pain in your back, sides, or upper abdomen or have severe pain while urinating. This information is not intended to replace advice given to you by your health care provider. Make sure you discuss any questions you have with your health care provider. Document Revised: 10/02/2018 Document Reviewed: 05/12/2016 Elsevier Patient Education  2020 [employer]  

## 2020-02-07 NOTE — Interval H&P Note (Signed)
History and Physical Interval Note: Sheila Allen is at the L5 transverse process.   02/07/2020 9:53 AM  Sheila Allen  has presented today for surgery, with the diagnosis of RIGHT URETERAL CALCULUS.  The various methods of treatment have been discussed with the patient and family. After consideration of risks, benefits and other options for treatment, the patient has consented to  Procedure(s): EXTRACORPOREAL SHOCK WAVE LITHOTRIPSY (ESWL) (Right) as a surgical intervention.  The patient's history has been reviewed, patient examined, no change in status, stable for surgery.  I have reviewed the patient's chart and labs.  Questions were answered to the patient's satisfaction.     Bjorn Pippin

## 2020-02-11 ENCOUNTER — Encounter (HOSPITAL_BASED_OUTPATIENT_CLINIC_OR_DEPARTMENT_OTHER): Payer: Self-pay | Admitting: Urology

## 2020-06-11 ENCOUNTER — Ambulatory Visit (INDEPENDENT_AMBULATORY_CARE_PROVIDER_SITE_OTHER): Payer: Medicare Other | Admitting: Adult Health

## 2020-06-11 ENCOUNTER — Other Ambulatory Visit: Payer: Self-pay

## 2020-06-11 ENCOUNTER — Encounter: Payer: Self-pay | Admitting: Adult Health

## 2020-06-11 VITALS — BP 136/90 | HR 76 | Ht 66.0 in | Wt 176.0 lb

## 2020-06-11 DIAGNOSIS — R209 Unspecified disturbances of skin sensation: Secondary | ICD-10-CM

## 2020-06-11 MED ORDER — DULOXETINE HCL 30 MG PO CPEP: 30.0000 mg | ORAL_CAPSULE | Freq: Two times a day (BID) | ORAL | 3 refills | Status: DC

## 2020-06-11 NOTE — Progress Notes (Signed)
I have read the note, and I agree with the clinical assessment and plan.  Jeancarlos Marchena K Trenity Pha   

## 2020-06-11 NOTE — Progress Notes (Signed)
PATIENT: Sheila Allen DOB: Nov 10, 1955  REASON FOR VISIT: follow up HISTORY FROM: patient  HISTORY OF PRESENT ILLNESS: Today 06/11/20:  Sheila Allen is a 64 year old female with a history of left-sided sensory changes.  She reports that she has been doing well on Cymbalta.  She continues to take Cymbalta 30 mg twice a day.  She reports that there are some nights she only takes 1 tablet.  Reports that she no longer has any symptoms on the left side.  Returns today for an evaluation.  06/11/20: Sheila Allen is a 64 year old female with a history of left-sided sensory changes.  She returns today for her yearly follow-up.  Since the initiation of Cymbalta she no longer has the symptoms.  She states that she has done relatively well.  She tried reducing Cymbalta to 1 tablet daily but reports that her symptoms return.  She denies any new symptoms.  She returns today for an evaluation.  HISTORY 06/07/18:  Sheila Allen is a 64 year old female with a history of left-sided sensory changes.  She returns today for her yearly follow-up.  She reports that Cymbalta continues to work well for her.  She denies any recurrence of symptoms.  Overall she continues to do well.  She returns today for an evaluation.  REVIEW OF SYSTEMS: Out of a complete 14 system review of symptoms, the patient complains only of the following symptoms, and all other reviewed systems are negative.  ALLERGIES: No Known Allergies  HOME MEDICATIONS: Outpatient Medications Prior to Visit  Medication Sig Dispense Refill  . ALPRAZolam (XANAX) 0.25 MG tablet Take 0.25 mg by mouth at bedtime as needed for anxiety.    Marland Kitchen aspirin EC 81 MG EC tablet Take 1 tablet (81 mg total) by mouth daily.    Marland Kitchen atorvastatin (LIPITOR) 20 MG tablet TK 1 T PO QD  5  . DULoxetine (CYMBALTA) 30 MG capsule Take 1 capsule (30 mg total) by mouth 2 (two) times daily. 180 capsule 3  . estradiol (VIVELLE-DOT) 0.075 MG/24HR Place 1 patch onto the skin 2 (two) times  a week. Monday and Thursday      . metoprolol succinate (TOPROL-XL) 50 MG 24 hr tablet Take 50 mg by mouth daily. Take with or immediately following a meal.    . Multiple Vitamin (MULTIVITAMIN WITH MINERALS) TABS tablet Take 1 tablet by mouth daily. OTC    . oxyCODONE-acetaminophen (PERCOCET) 5-325 MG tablet Take 1 tablet by mouth every 6 (six) hours as needed for moderate pain or severe pain. After lithotripsy. 15 tablet 0  . zolpidem (AMBIEN) 10 MG tablet Take 10 mg by mouth at bedtime as needed for sleep.     No facility-administered medications prior to visit.    PAST MEDICAL HISTORY: Past Medical History:  Diagnosis Date  . Complication of anesthesia   . Disturbance of skin sensation 11/07/2013  . Hip pain, left   . History of kidney stones   . Hypertension   . Kidney stone    lithotripsy  . PONV (postoperative nausea and vomiting)     PAST SURGICAL HISTORY: Past Surgical History:  Procedure Laterality Date  . ABDOMINAL HYSTERECTOMY    . CHOLECYSTECTOMY  12/2017  . EXTRACORPOREAL SHOCK WAVE LITHOTRIPSY Right 01/02/2018   Procedure: RIGHT EXTRACORPOREAL SHOCK WAVE LITHOTRIPSY (ESWL);  Surgeon: Marcine Matar, MD;  Location: [facility];  Service: Urology;  Laterality: Right;  . EXTRACORPOREAL SHOCK WAVE LITHOTRIPSY Left 07/20/2018   Procedure: EXTRACORPOREAL SHOCK WAVE LITHOTRIPSY (ESWL);  Surgeon: Annabell Howells,  Jonny Ruiz, MD;  Location: [facility];  Service: Urology;  Laterality: Left;  . EXTRACORPOREAL SHOCK WAVE LITHOTRIPSY Left 01/10/2020   Procedure: EXTRACORPOREAL SHOCK WAVE LITHOTRIPSY (ESWL);  Surgeon: Sebastian Ache, MD;  Location[facility];  Service: Urology;  Laterality: Left;  NEEDS 75 MIN  . EXTRACORPOREAL SHOCK WAVE LITHOTRIPSY Right 02/07/2020   Procedure: EXTRACORPOREAL SHOCK WAVE LITHOTRIPSY (ESWL);  Surgeon: Bjorn Pippin, MD;  Location: [facility];  Service: Urology;  Laterality: Right;  . HIP SURGERY Left    bursectomy  . OVARIAN CYST REMOVAL    .  TONSILLECTOMY      FAMILY HISTORY: Family History  Problem Relation Age of Onset  . Heart disease Mother   . Hypertension Mother   . Bladder Cancer Mother   . Stroke Mother   . Heart disease Father   . Hypertension Father   . Stroke Father   . Hypertension Sister   . Hypertension Son   . Hypertension Brother   . Hypertension Sister   . Hypertension Sister   . Hypertension Brother   . Migraines Neg Hx   . Seizures Neg Hx     SOCIAL HISTORY: Social History   Socioeconomic History  . Marital status: Married    Spouse name: Perlie Gold  . Number of children: 2  . Years of education: 77  . Highest education level: Not on file  Occupational History  . Occupation: Retired    Comment: Comptroller  . Occupation: PT substitute Runner, broadcasting/film/video    Comment: [employer] fast stream[employer]  Tobacco Use  . Smoking status: Never Smoker  . Smokeless tobacco: Never Used  Vaping Use  . Vaping Use: Never used  Substance and Sexual Activity  . Alcohol use: Yes    Alcohol/week: 0.0 standard drinks    Comment: OCASIONAL  . Drug use: No  . Sexual activity: Not on file  Other Topics Concern  . Not on file  Social History Narrative   Patient lives at home with family.. 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   Right handed.   Education 12 th grade.   Caffeine none    Social Determinants of Health   Financial Resource Strain:   . Difficulty of Paying Living Expenses: Not on file  Food Insecurity:   . Worried About Programme researcher, broadcasting/film/video in the Last Year: Not on file  . Ran Out of Food in the Last Year: Not on file  Transportation Needs:   . Lack of Transportation (Medical): Not on file  . Lack of Transportation (Non-Medical): Not on file  Physical Activity:   . Days of Exercise per Week: Not on file  . Minutes of Exercise per Session: Not on file  Stress:   . Feeling of Stress : Not on file  Social Connections:   . Frequency of Communication with  Friends and Family: Not on file  . Frequency of Social Gatherings with Friends and Family: Not on file  . Attends Religious Services: Not on file  . Active Member of Clubs or Organizations: Not on file  . Attends Banker Meetings: Not on file  . Marital Status: Not on file  Intimate Partner Violence:   . Fear of Current or Ex-Partner: Not on file  . Emotionally Abused: Not on file  . Physically Abused: Not on file  . Sexually Abused: Not on file      PHYSICAL EXAM  There were no vitals filed for  this visit. There is no height or weight on file to calculate BMI.  Generalized: Well developed, in no acute distress   Neurological examination  Mentation: Alert oriented to time, place, history taking. Follows all commands speech and language fluent Cranial nerve II-XII: Pupils were equal round reactive to light. Extraocular movements were full, visual field were full on confrontational test. Head turning and shoulder shrug  were normal and symmetric. Motor: The motor testing reveals 5 over 5 strength of all 4 extremities. Good symmetric motor tone is noted throughout.  Sensory: Sensory testing is intact to soft touch on all 4 extremities. No evidence of extinction is noted.  Coordination: Cerebellar testing reveals good finger-nose-finger and heel-to-shin bilaterally.  Gait and station: Gait is normal.   Reflexes: Deep tendon reflexes are symmetric and normal bilaterally.   DIAGNOSTIC DATA (LABS, IMAGING, TESTING) - I reviewed patient records, labs, notes, testing and imaging myself where available.  Lab Results  Component Value Date   WBC 9.2 07/07/2013   HGB 12.8 07/07/2013   HCT 38.7 07/07/2013   MCV 87.6 07/07/2013   PLT 366 07/07/2013      Component Value Date/Time   NA 140 07/07/2013 0337   K 3.1 (L) 07/07/2013 0337   CL 99 07/07/2013 0337   CO2 30 07/07/2013 0337   GLUCOSE 121 (H) 07/07/2013 0337   BUN 17 07/07/2013 0337   CREATININE 0.65 07/07/2013  0941   CALCIUM 9.1 07/07/2013 0337   PROT 7.1 07/15/2011 1410   ALBUMIN 3.7 07/15/2011 1410   AST 23 07/15/2011 1410   ALT 16 01/01/2014 0739   ALKPHOS 63 07/15/2011 1410   BILITOT 0.6 07/15/2011 1410   GFRNONAA >90 07/07/2013 0941   GFRAA >90 07/07/2013 0941   Lab Results  Component Value Date   CHOL 216 (H) 01/01/2014   HDL 50.00 01/01/2014   LDLCALC 138 (H) 01/01/2014   TRIG 142.0 01/01/2014   CHOLHDL 4 01/01/2014   Lab Results  Component Value Date   HGBA1C 5.6 07/07/2013   Lab Results  Component Value Date   VITAMINB12 454 02/20/2014   No results found for: TSH    ASSESSMENT AND PLAN 64 y.o. year old female  has a past medical history of Complication of anesthesia, Disturbance of skin sensation (11/07/2013), Hip pain, left, History of kidney stones, Hypertension, Kidney stone, and PONV (postoperative nausea and vomiting). here with :  1.  Left-sided sensory changes  -Continue Cymbalta 30 mg twice a day -Advised if symptoms worsen or she develops new symptoms she should let us know -Follow-up in 1 year or sooner if needed    I spent 20 minutes of face-to-face and non-face-to-face time with patient.  This included previsit chart review, lab review, study review, order entry, electronic health record documentation, patient education.    Butch Penny, MSN, NP-C 06/11/2020, 2:31 PM [facility] [address], [address] [place], [place] [address] [phone]

## 2021-03-29 IMAGING — DX DG ABDOMEN 1V
2 series · 2 of 2 positions shown · non-contrast
Comparison: 01/28/2020
COMPARISON: 01/28/2020

Addendum:
CLINICAL DATA: Preop right ureteral calculus

EXAM:
ABDOMEN - 1 VIEW

[abdomen kub (1 of 2)]
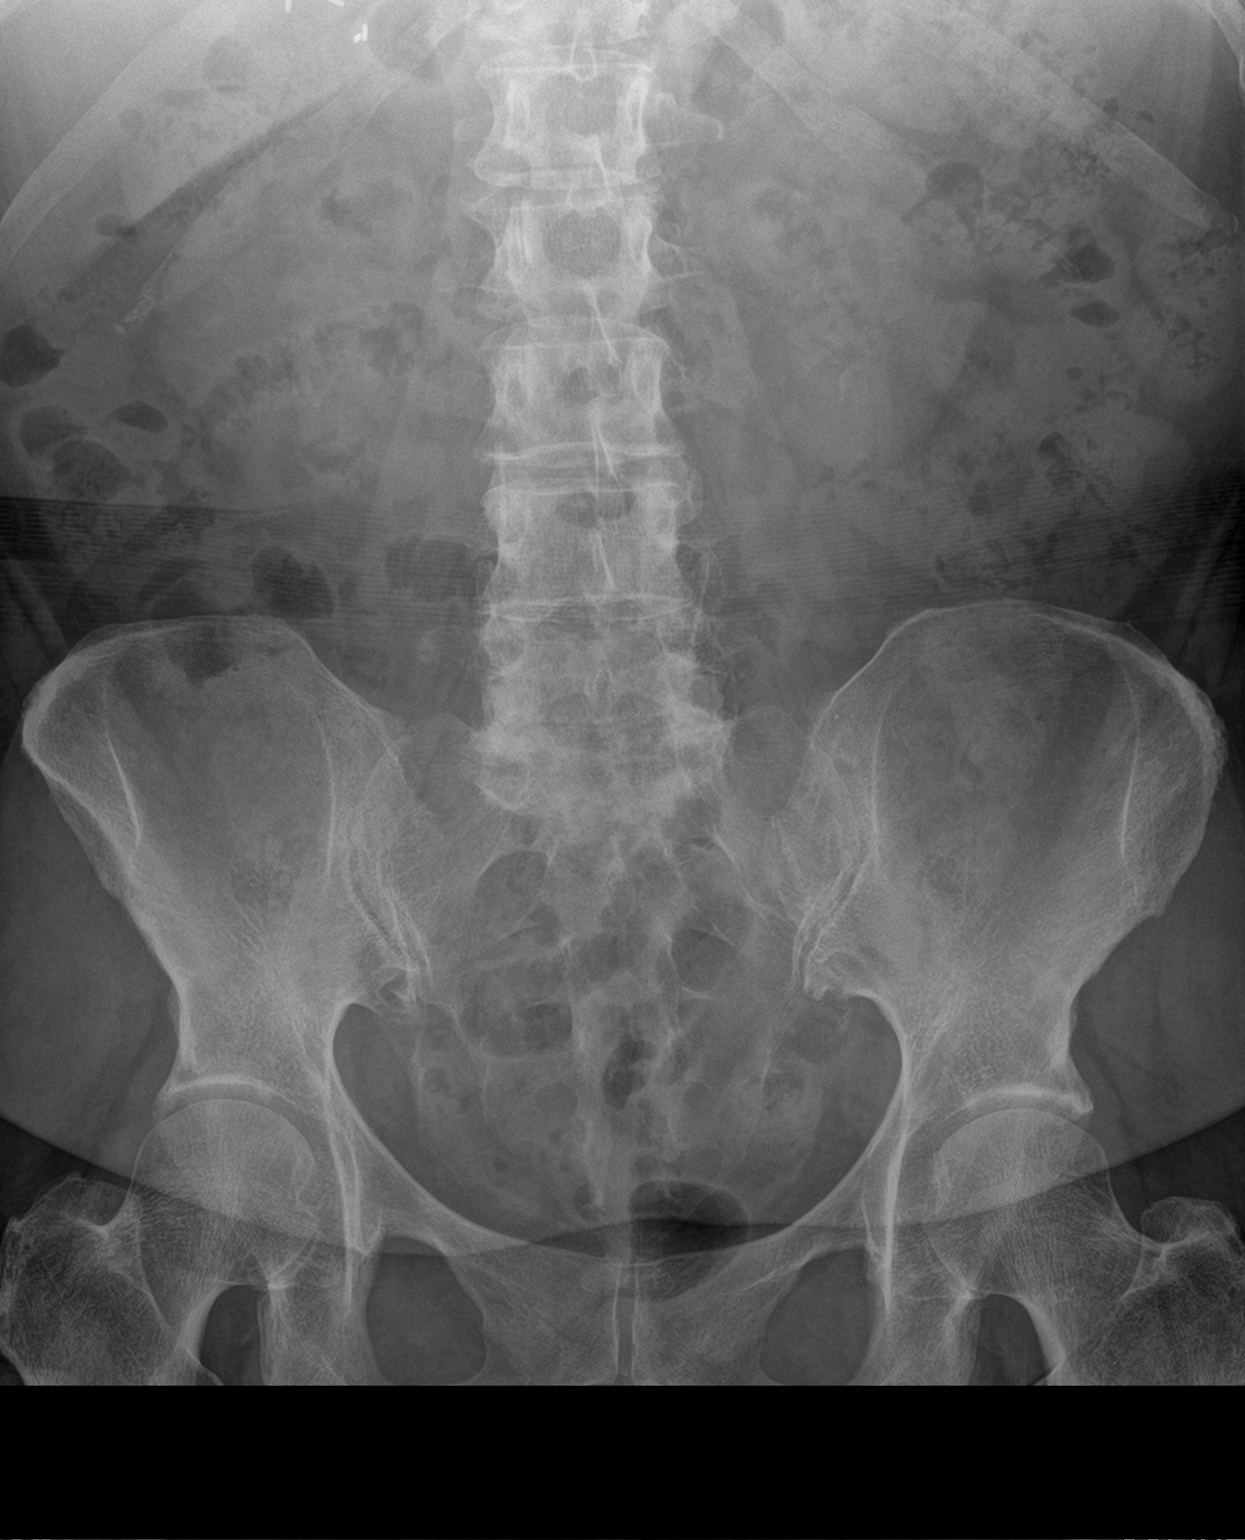

[abdomen kub (2 of 2)]
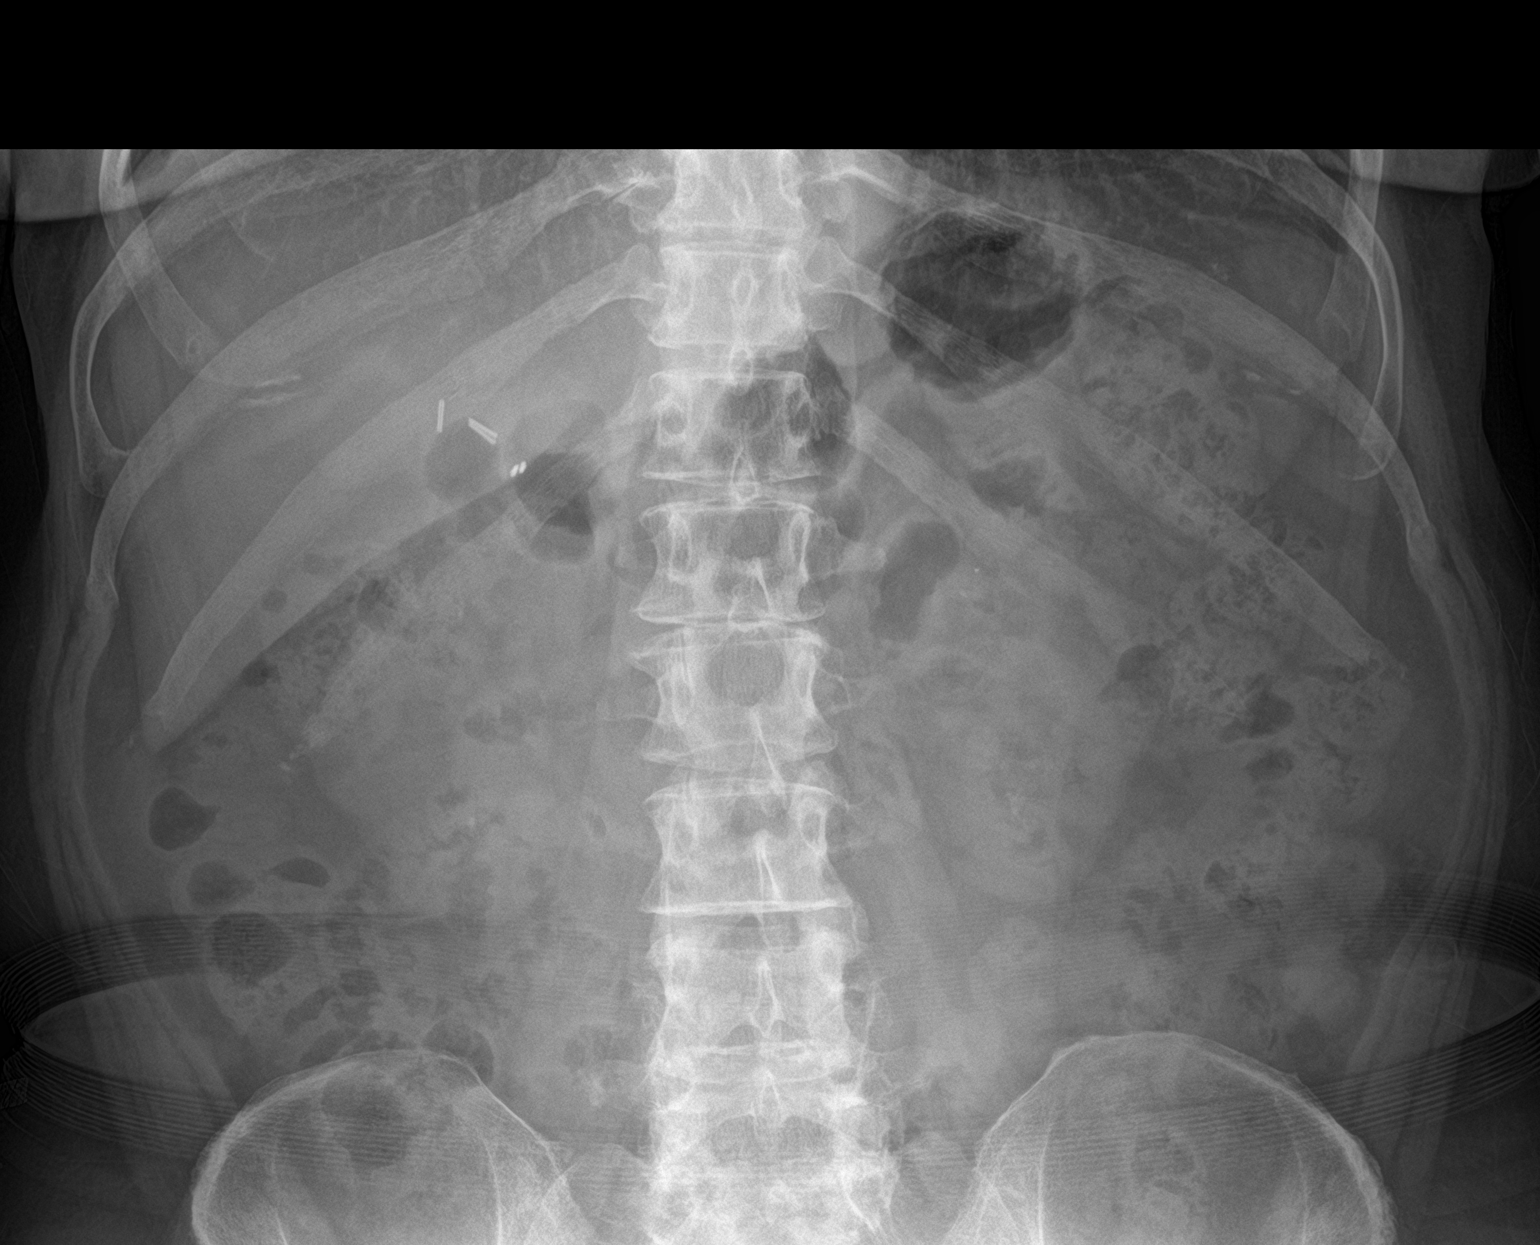

[2 of 2 positions shown; findings below may reference images not displayed]

FINDINGS: Small bilateral renal calculi are seen as seen on prior CT. No
visible ureteral stones by plain film. Nonobstructive bowel gas
pattern. Prior cholecystectomy. No free air.
IMPRESSION: Bilateral nephrolithiasis.

ADDENDUM:
After further review, there is questionable calcific density
projecting over the right L5 spinous process measuring approximately
5 mm. This may reflect distal migration of the previously seen
similarly sized and shaped calcification in the region of the right
UPJ.

*** End of Addendum ***
FINDINGS: Small bilateral renal calculi are seen as seen on prior CT. No
visible ureteral stones by plain film. Nonobstructive bowel gas
pattern. Prior cholecystectomy. No free air.
IMPRESSION: Bilateral nephrolithiasis.

## 2021-05-06 ENCOUNTER — Other Ambulatory Visit: Payer: Self-pay | Admitting: Obstetrics & Gynecology

## 2021-05-06 DIAGNOSIS — Z1231 Encounter for screening mammogram for malignant neoplasm of breast: Secondary | ICD-10-CM

## 2021-05-19 ENCOUNTER — Ambulatory Visit
Admission: RE | Admit: 2021-05-19 | Discharge: 2021-05-19 | Disposition: A | Discharge status: Home / Self Care | Payer: Medicare Other | Source: Ambulatory Visit | Attending: Obstetrics & Gynecology | Admitting: Obstetrics & Gynecology

## 2021-05-19 ENCOUNTER — Other Ambulatory Visit: Payer: Self-pay

## 2021-05-19 DIAGNOSIS — Z1231 Encounter for screening mammogram for malignant neoplasm of breast: Secondary | ICD-10-CM

## 2021-06-11 ENCOUNTER — Ambulatory Visit (INDEPENDENT_AMBULATORY_CARE_PROVIDER_SITE_OTHER): Payer: Medicare Other | Admitting: Adult Health

## 2021-06-11 ENCOUNTER — Encounter: Payer: Self-pay | Admitting: Adult Health

## 2021-06-11 VITALS — BP 128/74 | HR 75 | Ht 67.0 in | Wt 185.0 lb

## 2021-06-11 DIAGNOSIS — R209 Unspecified disturbances of skin sensation: Secondary | ICD-10-CM | POA: Diagnosis not present

## 2021-06-11 MED ORDER — DULOXETINE HCL 30 MG PO CPEP: 30.0000 mg | ORAL_CAPSULE | Freq: Two times a day (BID) | ORAL | 3 refills | Status: DC

## 2021-06-11 NOTE — Patient Instructions (Addendum)
Your Plan:  Continue cymbalta     Thank you for coming to see [place] at [facility]. I hope we have been able to provide you high quality care today.  You may receive a patient satisfaction survey over the next few weeks. We would appreciate your feedback and comments so that we may continue to improve ourselves and the health of our patients.

## 2021-06-11 NOTE — Progress Notes (Signed)
PATIENT: Sheila Allen DOB: July 23, 1955  REASON FOR VISIT: follow up HISTORY FROM: patient  HISTORY OF PRESENT ILLNESS: Today 06/11/21:  [name] is a 65 year old female with a history of left-sided sensory changes.  She returns today for follow-up.  Continues to do well on Cymbalta 30 mg twice a day.  Denies any new symptoms.  Returns today for an evaluation.  06/11/20: [name] is a 65 year old female with a history of left-sided sensory changes.  She reports that she has been doing well on Cymbalta.  She continues to take Cymbalta 30 mg twice a day.  She reports that there are some nights she only takes 1 tablet.  Reports that she no longer has any symptoms on the left side.  Returns today for an evaluation.  06/11/20: [name] is a 65 year old female with a history of left-sided sensory changes.  She returns today for her yearly follow-up.  Since the initiation of Cymbalta she no longer has the symptoms.  She states that she has done relatively well.  She tried reducing Cymbalta to 1 tablet daily but reports that her symptoms return.  She denies any new symptoms.  She returns today for an evaluation.  HISTORY 06/07/18:   [name] is a 65 year old female with a history of left-sided sensory changes.  She returns today for her yearly follow-up.  She reports that Cymbalta continues to work well for her.  She denies any recurrence of symptoms.  Overall she continues to do well.  She returns today for an evaluation.  REVIEW OF SYSTEMS: Out of a complete 14 system review of symptoms, the patient complains only of the following symptoms, and all other reviewed systems are negative.  ALLERGIES: No Known Allergies  HOME MEDICATIONS: Outpatient Medications Prior to Visit  Medication Sig Dispense Refill   aspirin EC 81 MG EC tablet Take 1 tablet (81 mg total) by mouth daily.     atorvastatin (LIPITOR) 20 MG tablet TK 1 T PO QD  5   DULoxetine (CYMBALTA) 30 MG capsule Take 1 capsule  (30 mg total) by mouth 2 (two) times daily. 180 capsule 3   estradiol (VIVELLE-DOT) 0.075 MG/24HR Place 1 patch onto the skin 2 (two) times a week. Monday and Thursday       metoprolol succinate (TOPROL-XL) 50 MG 24 hr tablet Take 50 mg by mouth daily. Take with or immediately following a meal.     Multiple Vitamin (MULTIVITAMIN WITH MINERALS) TABS tablet Take 1 tablet by mouth daily. OTC     ALPRAZolam (XANAX) 0.25 MG tablet Take 0.25 mg by mouth at bedtime as needed for anxiety.     zolpidem (AMBIEN) 10 MG tablet Take 10 mg by mouth at bedtime as needed for sleep.     No facility-administered medications prior to visit.    PAST MEDICAL HISTORY: Past Medical History:  Diagnosis Date   Complication of anesthesia    Disturbance of skin sensation 11/07/2013   Hip pain, left    History of kidney stones    Hypertension    Kidney stone    lithotripsy   PONV (postoperative nausea and vomiting)     PAST SURGICAL HISTORY: Past Surgical History:  Procedure Laterality Date   ABDOMINAL HYSTERECTOMY     CHOLECYSTECTOMY  12/2017   EXTRACORPOREAL SHOCK WAVE LITHOTRIPSY Right 01/02/2018   Procedure: RIGHT EXTRACORPOREAL SHOCK WAVE LITHOTRIPSY (ESWL);  Surgeon: Marcine Matar, MD;  Location: [facility];  Service: Urology;  Laterality: Right;   EXTRACORPOREAL  SHOCK WAVE LITHOTRIPSY Left 07/20/2018   Procedure: EXTRACORPOREAL SHOCK WAVE LITHOTRIPSY (ESWL);  Surgeon: Bjorn Pippin, MD;  Location[facility];  Service: Urology;  Laterality: Left;   EXTRACORPOREAL SHOCK WAVE LITHOTRIPSY Left 01/10/2020   Procedure: EXTRACORPOREAL SHOCK WAVE LITHOTRIPSY (ESWL);  Surgeon: Sebastian Ache, MD;  Location: [facility];  Service: Urology;  Laterality: Left;  NEEDS 75 MIN   EXTRACORPOREAL SHOCK WAVE LITHOTRIPSY Right 02/07/2020   Procedure: EXTRACORPOREAL SHOCK WAVE LITHOTRIPSY (ESWL);  Surgeon: Bjorn Pippin, MD;  Location[facility];  Service: Urology;  Laterality: Right;   HIP SURGERY Left     bursectomy   OVARIAN CYST REMOVAL     TONSILLECTOMY      FAMILY HISTORY: Family History  Problem Relation Age of Onset   Heart disease Mother    Hypertension Mother    Bladder Cancer Mother    Stroke Mother    Heart disease Father    Hypertension Father    Stroke Father    Hypertension Sister    Hypertension Son    Hypertension Brother    Hypertension Sister    Hypertension Sister    Hypertension Brother    Migraines Neg Hx    Seizures Neg Hx     SOCIAL HISTORY: Social History   Socioeconomic History   Marital status: Married    Spouse name: Perlie Gold   Number of children: 2   Years of education: 12   Highest education level: Not on file  Occupational History   Occupation: Retired    Comment: Comptroller   Occupation: PT substitute teacher    Comment: [employer]  Tobacco Use   Smoking status: Never   Smokeless tobacco: Never  Vaping Use   Vaping Use: Never used  Substance and Sexual Activity   Alcohol use: Yes    Alcohol/week: 7.0 standard drinks    Types: 7 Glasses of wine per week   Drug use: No   Sexual activity: Not on file  Other Topics Concern   Not on file  Social History Narrative   Patient lives at home with family.. 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   Right handed.   Education 12 th grade.   Caffeine none    Social Determinants of Health   Financial Resource Strain: Not on file  Food Insecurity: Not on file  Transportation Needs: Not on file  Physical Activity: Not on file  Stress: Not on file  Social Connections: Not on file  Intimate Partner Violence: Not on file      PHYSICAL EXAM  Vitals:   06/11/21 1442  BP: 128/74  Pulse: 75  Weight: 185 lb (83.9 kg)  Height: 5\' 7"  (1.702 m)   Body mass index is 28.98 kg/m.  Generalized: Well developed, in no acute distress   Neurological examination  Mentation: Alert oriented to time, place, history taking. Follows  all commands speech and language fluent Cranial nerve II-XII: Pupils were equal round reactive to light. Extraocular movements were full, visual field were full on confrontational test. Head turning and shoulder shrug  were normal and symmetric. Motor: The motor testing reveals 5 over 5 strength of all 4 extremities. Good symmetric motor tone is noted throughout.  Sensory: Sensory testing is intact to soft touch on all 4 extremities. No evidence of extinction is noted.  Coordination: Cerebellar testing reveals good finger-nose-finger and heel-to-shin bilaterally.  Gait and station: Gait is normal.   Reflexes: Deep  tendon reflexes are symmetric and normal bilaterally.   DIAGNOSTIC DATA (LABS, IMAGING, TESTING) - I reviewed patient records, labs, notes, testing and imaging myself where available.  Lab Results  Component Value Date   WBC 9.2 07/07/2013   HGB 12.8 07/07/2013   HCT 38.7 07/07/2013   MCV 87.6 07/07/2013   PLT 366 07/07/2013      Component Value Date/Time   NA 140 07/07/2013 0337   K 3.1 (L) 07/07/2013 0337   CL 99 07/07/2013 0337   CO2 30 07/07/2013 0337   GLUCOSE 121 (H) 07/07/2013 0337   BUN 17 07/07/2013 0337   CREATININE 0.65 07/07/2013 0941   CALCIUM 9.1 07/07/2013 0337   PROT 7.1 07/15/2011 1410   ALBUMIN 3.7 07/15/2011 1410   AST 23 07/15/2011 1410   ALT 16 01/01/2014 0739   ALKPHOS 63 07/15/2011 1410   BILITOT 0.6 07/15/2011 1410   GFRNONAA >90 07/07/2013 0941   GFRAA >90 07/07/2013 0941   Lab Results  Component Value Date   CHOL 216 (H) 01/01/2014   HDL 50.00 01/01/2014   LDLCALC 138 (H) 01/01/2014   TRIG 142.0 01/01/2014   CHOLHDL 4 01/01/2014   Lab Results  Component Value Date   HGBA1C 5.6 07/07/2013   Lab Results  Component Value Date   VITAMINB12 454 02/20/2014   No results found for: TSH    ASSESSMENT AND PLAN 65 y.o. year old female  has a past medical history of Complication of anesthesia, Disturbance of skin sensation (11/07/2013),  Hip pain, left, History of kidney stones, Hypertension, Kidney stone, and PONV (postoperative nausea and vomiting). here with :  1.  Left-sided sensory changes  -Continue Cymbalta 30 mg twice a day -Advised if symptoms worsen or she develops new symptoms she should let us know -Follow-up in 1 year or sooner if needed     Butch Penny, MSN, NP-C 06/11/2021, 2:49 PM [facility] [address] [place], [place] [address] [phone]

## 2022-03-27 ENCOUNTER — Other Ambulatory Visit: Payer: Self-pay | Admitting: Adult Health

## 2022-06-16 ENCOUNTER — Ambulatory Visit: Payer: Medicare Other | Admitting: Adult Health

## 2022-09-13 LAB — HM MAMMOGRAPHY

## 2022-12-06 ENCOUNTER — Other Ambulatory Visit: Payer: Self-pay | Admitting: Urology

## 2022-12-10 NOTE — Progress Notes (Signed)
Talked with patient. Hx and meds reviewed. Instructions given. Arrival time 0800. NPO after MN  Husband is the driver

## 2022-12-12 NOTE — H&P (Signed)
H&P  Chief Complaint: Lt sided kidney stone  History of Present Illness: Sheila Allen is a 67 y.o. year old female presenting for ESL of a 10 mm Lt upper ureteral stone.  Past Medical History:  Diagnosis Date   Complication of anesthesia    Disturbance of skin sensation 11/07/2013   Hip pain, left    History of kidney stones    Hypertension    Kidney stone    lithotripsy   PONV (postoperative nausea and vomiting)     Past Surgical History:  Procedure Laterality Date   ABDOMINAL HYSTERECTOMY     CHOLECYSTECTOMY  12/2017   EXTRACORPOREAL SHOCK WAVE LITHOTRIPSY Right 01/02/2018   Procedure: RIGHT EXTRACORPOREAL SHOCK WAVE LITHOTRIPSY (ESWL);  Surgeon: Marcine Matar, MD;  [facility];  Service: Urology;  Laterality: Right;   EXTRACORPOREAL SHOCK WAVE LITHOTRIPSY Left 07/20/2018   Procedure: EXTRACORPOREAL SHOCK WAVE LITHOTRIPSY (ESWL);  Surgeon: Bjorn Pippin, MD;  Location[facility];  Service: Urology;  Laterality: Left;   EXTRACORPOREAL SHOCK WAVE LITHOTRIPSY Left 01/10/2020   Procedure: EXTRACORPOREAL SHOCK WAVE LITHOTRIPSY (ESWL);  Surgeon: Sebastian Ache, MD;  Location: [facility];  Service: Urology;  Laterality: Left;  NEEDS 75 MIN   EXTRACORPOREAL SHOCK WAVE LITHOTRIPSY Right 02/07/2020   Procedure: EXTRACORPOREAL SHOCK WAVE LITHOTRIPSY (ESWL);  Surgeon: Bjorn Pippin, MD;  Location: [facility];  Service: Urology;  Laterality: Right;   HIP SURGERY Left    bursectomy   OVARIAN CYST REMOVAL     TONSILLECTOMY      Home Medications:  No medications prior to admission.    Allergies: No Known Allergies  Family History  Problem Relation Age of Onset   Heart disease Mother    Hypertension Mother    Bladder Cancer Mother    Stroke Mother    Heart disease Father    Hypertension Father    Stroke Father    Hypertension Sister    Hypertension Son    Hypertension Brother    Hypertension Sister    Hypertension Sister    Hypertension Brother     Migraines Neg Hx    Seizures Neg Hx     Social History:  reports that she has never smoked. She has never used smokeless tobacco. She reports current alcohol use of about 7.0 standard drinks of alcohol per week. She reports that she does not use drugs.  ROS: A complete review of systems was performed.  All systems are negative except for pertinent findings as noted.  Physical Exam:  Vital signs in last 24 hours:   General:  Alert and oriented, No acute distress HEENT: Normocephalic, atraumatic Neck: No JVD or lymphadenopathy Cardiovascular: Regular rate  Lungs: Normal inspiratory/expiratory excursion Extremities: No edema Neurologic: Grossly intact  I have reviewed prior pt notes  I have reviewed urinalysis results  I have independently reviewed prior imaging--CT images   Impression/Assessment:  10 mm lt upper ureteral calculus  Plan:  ESL  Bertram Millard Modupe Shampine 12/12/2022, 7:18 PM  Bertram Millard. Mazzy Santarelli MD

## 2022-12-13 ENCOUNTER — Encounter (HOSPITAL_BASED_OUTPATIENT_CLINIC_OR_DEPARTMENT_OTHER)
Admission: RE | Disposition: A | Discharge status: Home / Self Care | Payer: Self-pay | Source: Home / Self Care | Attending: Urology

## 2022-12-13 ENCOUNTER — Ambulatory Visit (HOSPITAL_COMMUNITY): Payer: Medicare Other

## 2022-12-13 ENCOUNTER — Encounter (HOSPITAL_BASED_OUTPATIENT_CLINIC_OR_DEPARTMENT_OTHER): Payer: Self-pay | Admitting: Urology

## 2022-12-13 ENCOUNTER — Ambulatory Visit (HOSPITAL_BASED_OUTPATIENT_CLINIC_OR_DEPARTMENT_OTHER)
Admission: RE | Admit: 2022-12-13 | Discharge: 2022-12-13 | Disposition: A | Discharge status: Home / Self Care | Payer: Medicare Other | Attending: Urology | Admitting: Urology

## 2022-12-13 DIAGNOSIS — Z7982 Long term (current) use of aspirin: Secondary | ICD-10-CM | POA: Insufficient documentation

## 2022-12-13 DIAGNOSIS — I10 Essential (primary) hypertension: Secondary | ICD-10-CM | POA: Diagnosis not present

## 2022-12-13 DIAGNOSIS — Z8249 Family history of ischemic heart disease and other diseases of the circulatory system: Secondary | ICD-10-CM | POA: Diagnosis not present

## 2022-12-13 DIAGNOSIS — Z7901 Long term (current) use of anticoagulants: Secondary | ICD-10-CM | POA: Diagnosis not present

## 2022-12-13 DIAGNOSIS — N201 Calculus of ureter: Secondary | ICD-10-CM | POA: Diagnosis present

## 2022-12-13 HISTORY — PX: EXTRACORPOREAL SHOCK WAVE LITHOTRIPSY: SHX1557

## 2022-12-13 SURGERY — LITHOTRIPSY, ESWL
Anesthesia: LOCAL | Laterality: Left

## 2022-12-13 MED ORDER — OXYCODONE HCL 5 MG PO TABS: 5.0000 mg | ORAL_TABLET | ORAL | 0 refills | Status: DC | PRN

## 2022-12-13 MED ORDER — DIAZEPAM 5 MG PO TABS
ORAL_TABLET | ORAL | Status: AC
  Filled 2022-12-13: qty 2

## 2022-12-13 MED ORDER — CIPROFLOXACIN HCL 500 MG PO TABS
500.0000 mg | ORAL_TABLET | ORAL | Status: AC
  Administered 2022-12-13: 500 mg via ORAL

## 2022-12-13 MED ORDER — DIPHENHYDRAMINE HCL 25 MG PO CAPS
25.0000 mg | ORAL_CAPSULE | ORAL | Status: AC
  Administered 2022-12-13: 25 mg via ORAL

## 2022-12-13 MED ORDER — DIPHENHYDRAMINE HCL 25 MG PO CAPS
ORAL_CAPSULE | ORAL | Status: AC
  Filled 2022-12-13: qty 1

## 2022-12-13 MED ORDER — CIPROFLOXACIN HCL 500 MG PO TABS
ORAL_TABLET | ORAL | Status: AC
  Filled 2022-12-13: qty 1

## 2022-12-13 MED ORDER — DIAZEPAM 5 MG PO TABS
10.0000 mg | ORAL_TABLET | ORAL | Status: AC
  Administered 2022-12-13: 10 mg via ORAL

## 2022-12-13 MED ORDER — SODIUM CHLORIDE 0.9 % IV SOLN: INTRAVENOUS | Status: DC

## 2022-12-13 NOTE — Discharge Instructions (Signed)
See [facility] discharge instructions in chart.  

## 2022-12-13 NOTE — Op Note (Signed)
See [facility] note scanned into chart. 

## 2022-12-13 NOTE — Interval H&P Note (Signed)
History and Physical Interval Note:  12/13/2022 8:58 AM  Sheila Allen  has presented today for surgery, with the diagnosis of LEFT URETERAL STONE.  The various methods of treatment have been discussed with the patient and family. After consideration of risks, benefits and other options for treatment, the patient has consented to  Procedure(s) with comments: LEFT EXTRACORPOREAL SHOCK WAVE LITHOTRIPSY (ESWL) (Left) - 75 MINUTES NEEDED FOR CASE as a surgical intervention.  The patient's history has been reviewed, patient examined, no change in status, stable for surgery.  I have reviewed the patient's chart and labs.  Questions were answered to the patient's satisfaction.     Bertram Millard Kala Gassmann

## 2022-12-14 ENCOUNTER — Encounter (HOSPITAL_BASED_OUTPATIENT_CLINIC_OR_DEPARTMENT_OTHER): Payer: Self-pay | Admitting: Urology

## 2022-12-28 ENCOUNTER — Other Ambulatory Visit: Payer: Self-pay | Admitting: Urology

## 2022-12-29 NOTE — Patient Instructions (Signed)
DUE TO COVID-19 ONLY TWO VISITORS  (aged 68 and older)  ARE ALLOWED TO COME WITH YOU AND STAY IN THE WAITING ROOM ONLY DURING PRE OP AND PROCEDURE.   **NO VISITORS ARE ALLOWED IN THE SHORT STAY AREA OR RECOVERY ROOM!!**  IF YOU WILL BE ADMITTED INTO THE HOSPITAL YOU ARE ALLOWED ONLY FOUR SUPPORT PEOPLE DURING VISITATION HOURS ONLY (7 AM -8PM)   The support person(s) must pass our screening, gel in and out, and wear a mask at all times, including in the patient's room. Patients must also wear a mask when staff or their support person are in the room. Visitors GUEST BADGE MUST BE WORN VISIBLY  One adult visitor may remain with you overnight and MUST be in the room by 8 P.M.     Your procedure is scheduled on: 01/07/23   Report to [facility] Main Entrance    Report to admitting at : 7:00 AM   Call this number if you have problems the morning of surgery [phone]   Do not eat food :After Midnight.   After Midnight you may have the following liquids until : 6:00 AM DAY OF SURGERY  Water Black Coffee (sugar ok, NO MILK/CREAM OR CREAMERS)  Tea (sugar ok, NO MILK/CREAM OR CREAMERS) regular and decaf                             Plain Jell-O (NO RED)                                           Fruit ices (not with fruit pulp, NO RED)                                     Popsicles (NO RED)                                                                  Juice: apple, WHITE grape, WHITE cranberry Sports drinks like Gatorade (NO RED)             Oral Hygiene is also important to reduce your risk of infection.                                    Remember - BRUSH YOUR TEETH THE MORNING OF SURGERY WITH YOUR REGULAR TOOTHPASTE  DENTURES WILL BE REMOVED PRIOR TO SURGERY PLEASE DO NOT APPLY "Poly grip" OR ADHESIVES!!!   Do NOT smoke after Midnight   Take these medicines the morning of surgery with A SIP OF WATER: metoprolol,duloxetine.                              You may not have any  metal on your body including hair pins, jewelry, and body piercing             Do not wear make-up, lotions, powders, perfumes/cologne, or deodorant  Do not wear  nail polish including gel and S&S, artificial/acrylic nails, or any other type of covering on natural nails including finger and toenails. If you have artificial nails, gel coating, etc. that needs to be removed by a nail salon please have this removed prior to surgery or surgery may need to be canceled/ delayed if the surgeon/ anesthesia feels like they are unable to be safely monitored.   Do not shave  48 hours prior to surgery.    Do not bring valuables to the hospital. [facility] IS NOT             RESPONSIBLE   FOR VALUABLES.   Contacts, glasses, or bridgework may not be worn into surgery.   Bring small overnight bag day of surgery.   DO NOT BRING YOUR HOME MEDICATIONS TO THE HOSPITAL. PHARMACY WILL DISPENSE MEDICATIONS LISTED ON YOUR MEDICATION LIST TO YOU DURING YOUR ADMISSION IN THE HOSPITAL!    Patients discharged on the day of surgery will not be allowed to drive home.  Someone NEEDS to stay with you for the first 24 hours after anesthesia.   Special Instructions: Bring a copy of your healthcare power of attorney and living will documents         the day of surgery if you haven't scanned them before.              Please read over the following fact sheets you were given: IF YOU HAVE QUESTIONS ABOUT YOUR PRE-OP INSTRUCTIONS PLEASE CALL [phone]    [facility] - Preparing for Surgery Before surgery, you can play an important role.  Because skin is not sterile, your skin needs to be as free of germs as possible.  You can reduce the number of germs on your skin by washing with CHG (chlorahexidine gluconate) soap before surgery.  CHG is an antiseptic cleaner which kills germs and bonds with the skin to continue killing germs even after washing. Please DO NOT use if you have an allergy to CHG or antibacterial soaps.  If  your skin becomes reddened/irritated stop using the CHG and inform your nurse when you arrive at Short Stay. Do not shave (including legs and underarms) for at least 48 hours prior to the first CHG shower.  You may shave your face/neck. Please follow these instructions carefully:  1.  Shower with CHG Soap the night before surgery and the  morning of Surgery.  2.  If you choose to wash your hair, wash your hair first as usual with your  normal  shampoo.  3.  After you shampoo, rinse your hair and body thoroughly to remove the  shampoo.                           4.  Use CHG as you would any other liquid soap.  You can apply chg directly  to the skin and wash                       Gently with a scrungie or clean washcloth.  5.  Apply the CHG Soap to your body ONLY FROM THE NECK DOWN.   Do not use on face/ open                           Wound or open sores. Avoid contact with eyes, ears mouth and genitals (private parts).  Wash face,  Genitals (private parts) with your normal soap.             6.  Wash thoroughly, paying special attention to the area where your surgery  will be performed.  7.  Thoroughly rinse your body with warm water from the neck down.  8.  DO NOT shower/wash with your normal soap after using and rinsing off  the CHG Soap.                9.  Pat yourself dry with a clean towel.            10.  Wear clean pajamas.            11.  Place clean sheets on your bed the night of your first shower and do not  sleep with pets. Day of Surgery : Do not apply any lotions/deodorants the morning of surgery.  Please wear clean clothes to the hospital/surgery center.  FAILURE TO FOLLOW THESE INSTRUCTIONS MAY RESULT IN THE CANCELLATION OF YOUR SURGERY PATIENT SIGNATURE_________________________________  NURSE SIGNATURE__________________________________  ________________________________________________________________________

## 2022-12-29 NOTE — Progress Notes (Signed)
Sent message, via epic in basket, requesting orders in epic from surgeon.  

## 2022-12-29 NOTE — Progress Notes (Signed)
Pt. Needs orders for surgery. 

## 2022-12-30 ENCOUNTER — Other Ambulatory Visit: Payer: Self-pay

## 2022-12-30 ENCOUNTER — Encounter (HOSPITAL_COMMUNITY)
Admission: RE | Admit: 2022-12-30 | Discharge: 2022-12-30 | Disposition: A | Discharge status: Home / Self Care | Payer: Medicare Other | Source: Ambulatory Visit | Attending: Urology | Admitting: Urology

## 2022-12-30 ENCOUNTER — Encounter (HOSPITAL_COMMUNITY): Payer: Self-pay

## 2022-12-30 VITALS — BP 133/79 | HR 78 | Temp 98.1°F | Ht 66.0 in | Wt 174.0 lb

## 2022-12-30 DIAGNOSIS — I10 Essential (primary) hypertension: Secondary | ICD-10-CM | POA: Insufficient documentation

## 2022-12-30 DIAGNOSIS — Z01818 Encounter for other preprocedural examination: Secondary | ICD-10-CM | POA: Insufficient documentation

## 2022-12-30 DIAGNOSIS — R9431 Abnormal electrocardiogram [ECG] [EKG]: Secondary | ICD-10-CM | POA: Diagnosis not present

## 2022-12-30 LAB — BASIC METABOLIC PANEL
Anion gap: 7 (ref 5–15)
BUN: 12 mg/dL (ref 8–23)
CO2: 27 mmol/L (ref 22–32)
Calcium: 8.5 mg/dL — ABNORMAL LOW (ref 8.9–10.3)
Chloride: 104 mmol/L (ref 98–111)
Creatinine, Ser: 0.75 mg/dL (ref 0.44–1.00)
GFR, Estimated: >60 mL/min (ref >60)
Glucose, Bld: 108 mg/dL — ABNORMAL HIGH (ref 70–99)
Potassium: 3.9 mmol/L (ref 3.5–5.1)
Sodium: 138 mmol/L (ref 135–145)

## 2022-12-30 LAB — CBC
HCT: 41.6 % (ref 36.0–46.0)
Hemoglobin: 13.1 g/dL (ref 12.0–15.0)
MCH: 28.1 pg (ref 26.0–34.0)
MCHC: 31.5 g/dL (ref 30.0–36.0)
MCV: 89.3 fL (ref 80.0–100.0)
Platelets: 394 10*3/uL (ref 150–400)
RBC: 4.66 MIL/uL (ref 3.87–5.11)
RDW: 12.8 % (ref 11.5–15.5)
WBC: 6.4 10*3/uL (ref 4.0–10.5)
nRBC: 0 % (ref 0.0–0.2)

## 2022-12-30 NOTE — Progress Notes (Signed)
For Short Stay: COVID SWAB appointment date:  Bowel Prep reminder:   For Anesthesia: PCP - Dr. Leodis Sias: [facility] practice. Cardiologist - N/A  Chest x-ray -  EKG - 12/30/22 Stress Test -  ECHO -  Cardiac Cath -  Pacemaker/ICD device last checked: Pacemaker orders received: Device Rep notified:  Spinal Cord Stimulator: N/A  Sleep Study - N/A CPAP -   Fasting Blood Sugar - N/A Checks Blood Sugar _____ times a day Date and result of last Hgb A1c-  Last dose of GLP1 agonist- N/A GLP1 instructions:   Last dose of SGLT-2 inhibitors- N/A SGLT-2 instructions:   Blood Thinner Instructions:  Aspirin Instructions: On hold since: 12/29/22 Last Dose:  Activity level: Can go up a flight of stairs and activities of daily living without stopping and without chest pain and/or shortness of breath   Able to exercise without chest pain and/or shortness of breath  Anesthesia review: Hx: HTN.  Patient denies shortness of breath, fever, cough and chest pain at PAT appointment   Patient verbalized understanding of instructions that were given to them at the PAT appointment. Patient was also instructed that they will need to review over the PAT instructions again at home before surgery.

## 2023-01-05 NOTE — H&P (Signed)
Office Visit Report     12/27/2022   --------------------------------------------------------------------------------   Sheila Allen  MRN: [id number]  DOB: 09-Jun-1956, 67 year old Female  SSN: [id number]   PRIMARY CARE:  Adaku B. Nnodi, MD  PRIMARY CARE FAX:  [phone]  REFERRING:  Alen Blew. Lafonda Mosses, MD  PROVIDER:  Jerilee Field, M.D.  TREATING:  Bartholomew Crews, NP  LOCATION:  [facility] [phone]     --------------------------------------------------------------------------------   CC/HPI: Sheila Allen is a 67 year old female who presents today to discuss a possible kidney stone. She has a long history of stones and was last seen in our office in August 2022.   She began having left abdominal and flank pain last night with some associated nausea and vomiting. She denies subjective fever symptoms. Her urinalysis is not suspicious for infection this morning.   I obtained a CT scan which shows an obstructing left 1 cm stone with some associated hydronephrosis. The stone is clearly visible on the scout film.   12/27/2022: 67 year old female who underwent a left-sided ESWL for a 1 cm proximal left ureteral stone. She has not passed any fragments. She continues to have symptoms including pain, urinary frequency and urgency, and occasional dysuria. She denies fevers and chills.     ALLERGIES: No Allergies    MEDICATIONS: Metoprolol Succinate 50 mg tablet, extended release 24 hr  Alprazolam 0.25 mg tablet tablet PRN  Aspirin Ec 81 mg tablet, delayed release  Duloxetine Hcl 30 mg capsule,delayed release  Estradiol (Twice Weekly) 0.075 mg/24 hour patch, transdermal semiweekly  Oxycodone Hcl 5 mg tablet 1 tablet PO q 4-6 hrs prn pain     GU PSH: ESWL, Right - 2021, Left - 2021, Left - 2020, 2019, 2014 Hysterectomy Unilat SO - 2014 Locm 300-399Mg /Ml Iodine,1Ml - 02/17/2021       PSH Notes: Lithotripsy, Excision Of Ischial Bursa, Hysterectomy   NON-GU PSH:  Cholecystectomy (laparoscopic) Remove Ischial Bursa - 2014     GU PMH: Flank Pain (Stable) - 12/06/2022, - 2021 Ureteral calculus (Stable) - 12/06/2022, right-sided ureteral calculus. Mildly to moderately symptomatic, no complicating factors noted. This is right at the UVJ., - 02/18/2021 (Stable), - 2021, - 2019 Ureteral obstruction secondary to calculous - 12/06/2022 Gross hematuria - 02/17/2021, Urine sent for cx. Check CT a/p - f/u cystoscopy. Start tamsulosin and abx. , - 02/11/2021 Renal calculus, check CT scan as above. - 02/11/2021, - 2021, - 2020, - 2019, - 2019, - 2019, - 2019, - 2019, Nephrolithiasis, - 2014 Dysuria - 2019 Renal cyst - 2019, Renal cyst, acquired, - 2014 Other microscopic hematuria - 2018 LLQ pain, Abdominal pain, LLQ (left lower quadrant) - 2016      PMH Notes:  2012-09-20 13:56:06 - Note: Arthritis   NON-GU PMH: Bacteriuria, Bacteriuria - 2016, Bacteriuria, asymptomatic, - 2014 Encounter for general adult medical examination without abnormal findings, Encounter for preventive health examination - 2016 Personal history of other diseases of the circulatory system, History of hypertension - 2014    FAMILY HISTORY: Bladder Cancer - Mother Death In The Family Father - Runs In Family Death In The Family Mother - Runs In Family Family Health Status Children is 1 daughter and 1 - Runs In Family Heart Disease - Runs In Family nephrolithiasis - Runs In Family Prostate Cancer - Runs In Family   SOCIAL HISTORY: Marital Status: Married Preferred Language: English; Ethnicity: Not Hispanic Or Latino; Race: White Current Smoking Status: Patient has never smoked.   Tobacco Use  Assessment Completed: Used Tobacco in last 30 days? Social Drinker.  Drinks 1 caffeinated drink per day.     Notes: Alcohol Use, Never A Smoker, Caffeine Use, Marital History - Currently Married, Retired From Work   REVIEW OF SYSTEMS:    GU Review Female:   Patient reports get up at night to urinate.  Patient denies frequent urination, hard to postpone urination, burning /pain with urination, leakage of urine, stream starts and stops, trouble starting your stream, have to strain to urinate, and being pregnant.  Gastrointestinal (Upper):   Patient denies nausea, vomiting, and indigestion/ heartburn.  Gastrointestinal (Lower):   Patient denies diarrhea and constipation.  Constitutional:   Patient denies fever, night sweats, weight loss, and fatigue.  Skin:   Patient denies skin rash/ lesion and itching.  Eyes:   Patient denies blurred vision and double vision.  Ears/ Nose/ Throat:   Patient denies sore throat and sinus problems.  Hematologic/Lymphatic:   Patient denies swollen glands and easy bruising.  Cardiovascular:   Patient denies leg swelling and chest pains.  Respiratory:   Patient denies cough and shortness of breath.  Endocrine:   Patient denies excessive thirst.  Musculoskeletal:   Patient denies back pain and joint pain.  Neurological:   Patient denies headaches and dizziness.  Psychologic:   Patient denies depression and anxiety.   Notes: possible stone left flank pain     VITAL SIGNS:      12/27/2022 12:49 PM  BP 125/79 mmHg  Pulse 80 /min  Temperature 98.2 F / 36.7 C   MULTI-SYSTEM PHYSICAL EXAMINATION:    Constitutional: Well-nourished. No physical deformities. Normally developed. Good grooming.  Cardiovascular: Normal temperature, normal extremity pulses, no swelling, no varicosities.  Skin: No paleness, no jaundice, no cyanosis. No lesion, no ulcer, no rash.  Neurologic / Psychiatric: Oriented to time, oriented to place, oriented to person. No depression, no anxiety, no agitation.  Gastrointestinal: No mass, no tenderness, no rigidity, non obese abdomen.     Complexity of Data:  Source Of History:  Patient  Records Review:   Previous Doctor Records, Previous Patient Records  Urine Test Review:   Urinalysis  X-Ray Review: KUB: Reviewed Films. Reviewed Report.  Discussed With Patient.     12/27/22  Urinalysis  Urine Appearance Clear   Urine Color Yellow   Urine Glucose Neg mg/dL  Urine Bilirubin Neg mg/dL  Urine Ketones Neg mg/dL  Urine Specific Gravity 1.025   Urine Blood 3+ ery/uL  Urine pH 6.0   Urine Protein Neg mg/dL  Urine Urobilinogen 0.2 mg/dL  Urine Nitrites Neg   Urine Leukocyte Esterase Neg leu/uL  Urine WBC/hpf NS (Not Seen)   Urine RBC/hpf 10 - 20/hpf   Urine Epithelial Cells NS (Not Seen)   Urine Bacteria Few (10-25/hpf)   Urine Mucous Present   Urine Yeast NS (Not Seen)   Urine Trichomonas Not Present   Urine Cystals NS (Not Seen)   Urine Casts NS (Not Seen)   Urine Sperm Not Present    PROCEDURES:         KUB - 16109  A single view of the abdomen is obtained. Prominent overlying bowel gas. In the left ureter at the SI joints there is a string of fragments measuring approximately 4.4 mm across.       Patient confirmed No Neulasta OnPro Device.           Urinalysis w/Scope Dipstick Dipstick Cont'd Micro  Color: Yellow Bilirubin: Neg mg/dL WBC/hpf: NS (Not  Seen)  Appearance: Clear Ketones: Neg mg/dL RBC/hpf: 10 - 85/OYD  Specific Gravity: 1.025 Blood: 3+ ery/uL Bacteria: Few (10-25/hpf)  pH: 6.0 Protein: Neg mg/dL Cystals: NS (Not Seen)  Glucose: Neg mg/dL Urobilinogen: 0.2 mg/dL Casts: NS (Not Seen)    Nitrites: Neg Trichomonas: Not Present    Leukocyte Esterase: Neg leu/uL Mucous: Present      Epithelial Cells: NS (Not Seen)      Yeast: NS (Not Seen)      Sperm: Not Present    ASSESSMENT:      ICD-10 Details  1 GU:   Ureteral calculus - N20.1 Left, Acute, Uncomplicated   PLAN:           Document Letter(s):  Created for Patient: Clinical Summary         Notes:   Urine with continued blood. Will be sent for precautionary culture. Discussed her KUB and remaining fragments as well as her persistent symptoms. She would like to undergo ureteroscopy. Urinalysis will be sent for precautionary culture today.  Stone intervention was discussed in detail today. For ureteroscopy, the patient understands that there is a chance for a staged procedure. Patient also understands that there is risk for bleeding, infection, injury to surrounding organs, and general risks of anesthesia. The patient also understands the placement of a stent and the risks of stent placement including, risk for infection, the risk for pain, and the risk for injury. For ESWL, the patient understands that there is a chance of failure of procedure, there is also a risk for bruising, infection, bleeding, and injury to surrounding structures. The patient verbalized understanding to these risks.    * Signed by Bartholomew Crews, NP on 12/27/22 at 6:01 PM (EDT)*      Add: no cx was sent but will assess pt for UTI symptoms.

## 2023-01-07 ENCOUNTER — Ambulatory Visit (HOSPITAL_COMMUNITY): Payer: Medicare Other

## 2023-01-07 ENCOUNTER — Ambulatory Visit (HOSPITAL_COMMUNITY): Payer: Medicare Other | Admitting: Registered Nurse

## 2023-01-07 ENCOUNTER — Ambulatory Visit (HOSPITAL_COMMUNITY)
Admission: RE | Admit: 2023-01-07 | Discharge: 2023-01-07 | Disposition: A | Discharge status: Home / Self Care | Payer: Medicare Other | Attending: Urology | Admitting: Urology

## 2023-01-07 ENCOUNTER — Encounter (HOSPITAL_COMMUNITY)
Admission: RE | Disposition: A | Discharge status: Home / Self Care | Payer: Self-pay | Source: Home / Self Care | Attending: Urology

## 2023-01-07 ENCOUNTER — Other Ambulatory Visit: Payer: Self-pay

## 2023-01-07 ENCOUNTER — Ambulatory Visit (HOSPITAL_BASED_OUTPATIENT_CLINIC_OR_DEPARTMENT_OTHER): Payer: Medicare Other | Admitting: Registered Nurse

## 2023-01-07 ENCOUNTER — Encounter (HOSPITAL_COMMUNITY): Payer: Self-pay | Admitting: Urology

## 2023-01-07 DIAGNOSIS — N132 Hydronephrosis with renal and ureteral calculous obstruction: Secondary | ICD-10-CM | POA: Diagnosis present

## 2023-01-07 DIAGNOSIS — N202 Calculus of kidney with calculus of ureter: Secondary | ICD-10-CM

## 2023-01-07 DIAGNOSIS — I10 Essential (primary) hypertension: Secondary | ICD-10-CM | POA: Insufficient documentation

## 2023-01-07 DIAGNOSIS — N201 Calculus of ureter: Secondary | ICD-10-CM

## 2023-01-07 HISTORY — PX: HOLMIUM LASER APPLICATION: SHX5852

## 2023-01-07 HISTORY — PX: CYSTOSCOPY WITH RETROGRADE PYELOGRAM, URETEROSCOPY AND STENT PLACEMENT: SHX5789

## 2023-01-07 SURGERY — CYSTOURETEROSCOPY, WITH RETROGRADE PYELOGRAM AND STENT INSERTION
Anesthesia: General | Laterality: Left

## 2023-01-07 MED ORDER — LIDOCAINE 2% (20 MG/ML) 5 ML SYRINGE
INTRAMUSCULAR | Status: DC | PRN
  Administered 2023-01-07: 100 mg via INTRAVENOUS

## 2023-01-07 MED ORDER — SODIUM CHLORIDE 0.9 % IR SOLN
Status: DC | PRN
  Administered 2023-01-07: 3000 mL via INTRAVESICAL

## 2023-01-07 MED ORDER — CEFAZOLIN SODIUM-DEXTROSE 2-4 GM/100ML-% IV SOLN
2.0000 g | INTRAVENOUS | Status: AC
  Administered 2023-01-07: 2 g via INTRAVENOUS
  Filled 2023-01-07: qty 100

## 2023-01-07 MED ORDER — DEXAMETHASONE SODIUM PHOSPHATE 10 MG/ML IJ SOLN
INTRAMUSCULAR | Status: AC
  Filled 2023-01-07: qty 1

## 2023-01-07 MED ORDER — MIDAZOLAM HCL 5 MG/5ML IJ SOLN
INTRAMUSCULAR | Status: DC | PRN
  Administered 2023-01-07: 2 mg via INTRAVENOUS

## 2023-01-07 MED ORDER — ONDANSETRON HCL 4 MG/2ML IJ SOLN: 4.0000 mg | Freq: Once | INTRAMUSCULAR | Status: DC | PRN

## 2023-01-07 MED ORDER — MIDAZOLAM HCL 2 MG/2ML IJ SOLN
INTRAMUSCULAR | Status: AC
  Filled 2023-01-07: qty 2

## 2023-01-07 MED ORDER — OXYCODONE HCL 5 MG PO TABS: 5.0000 mg | ORAL_TABLET | Freq: Once | ORAL | Status: DC | PRN

## 2023-01-07 MED ORDER — DEXAMETHASONE SODIUM PHOSPHATE 10 MG/ML IJ SOLN
INTRAMUSCULAR | Status: DC | PRN
  Administered 2023-01-07: 8 mg via INTRAVENOUS

## 2023-01-07 MED ORDER — EPHEDRINE 5 MG/ML INJ
INTRAVENOUS | Status: AC
  Filled 2023-01-07: qty 5

## 2023-01-07 MED ORDER — CHLORHEXIDINE GLUCONATE 0.12 % MT SOLN
15.0000 mL | Freq: Once | OROMUCOSAL | Status: AC
  Administered 2023-01-07: 15 mL via OROMUCOSAL

## 2023-01-07 MED ORDER — FENTANYL CITRATE (PF) 100 MCG/2ML IJ SOLN
INTRAMUSCULAR | Status: DC | PRN
  Administered 2023-01-07: 50 ug via INTRAVENOUS

## 2023-01-07 MED ORDER — ONDANSETRON HCL 4 MG/2ML IJ SOLN
INTRAMUSCULAR | Status: DC | PRN
  Administered 2023-01-07: 4 mg via INTRAVENOUS

## 2023-01-07 MED ORDER — IOHEXOL 300 MG/ML  SOLN
INTRAMUSCULAR | Status: DC | PRN
  Administered 2023-01-07: 5 mL via URETHRAL

## 2023-01-07 MED ORDER — PROPOFOL 10 MG/ML IV BOLUS
INTRAVENOUS | Status: AC
  Filled 2023-01-07: qty 20

## 2023-01-07 MED ORDER — FENTANYL CITRATE PF 50 MCG/ML IJ SOSY: 25.0000 ug | PREFILLED_SYRINGE | INTRAMUSCULAR | Status: DC | PRN

## 2023-01-07 MED ORDER — FENTANYL CITRATE (PF) 100 MCG/2ML IJ SOLN
INTRAMUSCULAR | Status: AC
  Filled 2023-01-07: qty 2

## 2023-01-07 MED ORDER — EPHEDRINE SULFATE-NACL 50-0.9 MG/10ML-% IV SOSY
PREFILLED_SYRINGE | INTRAVENOUS | Status: DC | PRN
  Administered 2023-01-07 (×2): 5 mg via INTRAVENOUS

## 2023-01-07 MED ORDER — PROPOFOL 10 MG/ML IV BOLUS
INTRAVENOUS | Status: DC | PRN
  Administered 2023-01-07: 200 mg via INTRAVENOUS

## 2023-01-07 MED ORDER — LACTATED RINGERS IV SOLN: INTRAVENOUS | Status: DC

## 2023-01-07 MED ORDER — LIDOCAINE HCL (PF) 2 % IJ SOLN
INTRAMUSCULAR | Status: AC
  Filled 2023-01-07: qty 5

## 2023-01-07 MED ORDER — ORAL CARE MOUTH RINSE: 15.0000 mL | Freq: Once | OROMUCOSAL | Status: AC

## 2023-01-07 MED ORDER — OXYCODONE HCL 5 MG/5ML PO SOLN: 5.0000 mg | Freq: Once | ORAL | Status: DC | PRN

## 2023-01-07 SURGICAL SUPPLY — 27 items
BAG URO CATCHER STRL LF (MISCELLANEOUS) ×1 IMPLANT
BASKET ZERO TIP NITINOL 2.4FR (BASKET) IMPLANT
BSKT STON RTRVL ZERO TP 2.4FR (BASKET) ×1
CATH URET 5FR 70CM CONE TIP (BALLOONS) IMPLANT
CATH URETL OPEN END 6FR 70 (CATHETERS) ×1 IMPLANT
CLOTH BEACON ORANGE TIMEOUT ST (SAFETY) ×1 IMPLANT
FIBER LASER MOSES 200 DFL (Laser) IMPLANT
FIBER LASER MOSES 365 DFL (Laser) IMPLANT
GLOVE BIO SURGEON STRL SZ7.5 (GLOVE) ×1 IMPLANT
GOWN STRL REUS W/ TWL XL LVL3 (GOWN DISPOSABLE) ×1 IMPLANT
GOWN STRL REUS W/TWL XL LVL3 (GOWN DISPOSABLE) ×1
GUIDEWIRE STR DUAL SENSOR (WIRE) ×1 IMPLANT
GUIDEWIRE ZIPWRE .038 STRAIGHT (WIRE) IMPLANT
IV NS IRRIG 3000ML ARTHROMATIC (IV SOLUTION) IMPLANT
KIT TURNOVER KIT A (KITS) IMPLANT
LASER FIB FLEXIVA PULSE ID 365 (Laser) IMPLANT
LASER FIB FLEXIVA PULSE ID 550 (Laser) IMPLANT
LASER FIB FLEXIVA PULSE ID 910 (Laser) IMPLANT
MANIFOLD NEPTUNE II (INSTRUMENTS) ×1 IMPLANT
PACK CYSTO (CUSTOM PROCEDURE TRAY) ×1 IMPLANT
SHEATH NAVIGATOR HD 11/13X28 (SHEATH) IMPLANT
SHEATH NAVIGATOR HD 11/13X36 (SHEATH) IMPLANT
STENT URET 6FRX26 CONTOUR (STENTS) IMPLANT
TRACTIP FLEXIVA PULS ID 200XHI (Laser) IMPLANT
TRACTIP FLEXIVA PULSE ID 200 (Laser) ×1
TUBING CONNECTING 10 (TUBING) ×1 IMPLANT
TUBING UROLOGY SET (TUBING) ×1 IMPLANT

## 2023-01-07 NOTE — Anesthesia Preprocedure Evaluation (Signed)
Anesthesia Evaluation  Patient identified by MRN, date of birth, ID band Patient awake    Reviewed: Allergy & Precautions, H&P , NPO status , Patient's Chart, lab work & pertinent test results  Airway Mallampati: II  TM Distance: >3 FB Neck ROM: Full    Dental no notable dental hx.    Pulmonary neg pulmonary ROS   Pulmonary exam normal breath sounds clear to auscultation       Cardiovascular hypertension, Normal cardiovascular exam Rhythm:Regular Rate:Normal     Neuro/Psych negative neurological ROS  negative psych ROS   GI/Hepatic negative GI ROS, Neg liver ROS,,,  Endo/Other  negative endocrine ROS    Renal/GU negative Renal ROS  negative genitourinary   Musculoskeletal negative musculoskeletal ROS (+)    Abdominal   Peds negative pediatric ROS (+)  Hematology negative hematology ROS (+)   Anesthesia Other Findings   Reproductive/Obstetrics negative OB ROS                             Anesthesia Physical Anesthesia Plan  ASA: 2  Anesthesia Plan: General   Post-op Pain Management: Minimal or no pain anticipated   Induction: Intravenous  PONV Risk Score and Plan: 3 and Ondansetron, Dexamethasone and Treatment may vary due to age or medical condition  Airway Management Planned: LMA  Additional Equipment:   Intra-op Plan:   Post-operative Plan: Extubation in OR  Informed Consent: I have reviewed the patients History and Physical, chart, labs and discussed the procedure including the risks, benefits and alternatives for the proposed anesthesia with the patient or authorized representative who has indicated his/her understanding and acceptance.     Dental advisory given  Plan Discussed with: CRNA and Surgeon  Anesthesia Plan Comments:        Anesthesia Quick Evaluation  

## 2023-01-07 NOTE — Anesthesia Postprocedure Evaluation (Signed)
Anesthesia Post Note  Patient: Sheila Allen  Procedure(s) Performed: CYSTOSCOPY WITH LEFT  RETROGRADE PYELOGRAM, URETEROSCOPY AND STENT PLACEMENT (Left) HOLMIUM LASER APPLICATION (Left)     Patient location during evaluation: PACU Anesthesia Type: General Level of consciousness: awake and alert Pain management: pain level controlled Vital Signs Assessment: post-procedure vital signs reviewed and stable Respiratory status: spontaneous breathing, nonlabored ventilation, respiratory function stable and patient connected to nasal cannula oxygen Cardiovascular status: blood pressure returned to baseline and stable Postop Assessment: no apparent nausea or vomiting Anesthetic complications: no  No notable events documented.  Last Vitals:  Vitals:   01/07/23 1045 01/07/23 1100  BP: 116/79 125/65  Pulse: 84 76  Resp: 18 15  Temp:  36.9 C  SpO2: 94% 92%    Last Pain:  Vitals:   01/07/23 1100  TempSrc:   PainSc: 0-No pain                 Henning Ehle S

## 2023-01-07 NOTE — Transfer of Care (Signed)
Immediate Anesthesia Transfer of Care Note  Patient: Sheila Allen  Procedure(s) Performed: CYSTOSCOPY WITH LEFT  RETROGRADE PYELOGRAM, URETEROSCOPY AND STENT PLACEMENT (Left) HOLMIUM LASER APPLICATION (Left)  Patient Location: PACU  Anesthesia Type:General  Level of Consciousness: awake, alert , oriented, and patient cooperative  Airway & Oxygen Therapy: Patient Spontanous Breathing and Patient connected to face mask oxygen  Post-op Assessment: Report given to RN, Post -op Vital signs reviewed and stable, and Patient moving all extremities  Post vital signs: Reviewed and stable  Last Vitals:  Vitals Value Taken Time  BP 143/80 01/07/23 1023  Temp    Pulse 88 01/07/23 1025  Resp 14 01/07/23 1025  SpO2 100 % 01/07/23 1025  Vitals shown include unvalidated device data.  Last Pain:  Vitals:   01/07/23 0719  TempSrc:   PainSc: 0-No pain         Complications: No notable events documented.

## 2023-01-07 NOTE — Anesthesia Procedure Notes (Signed)
Procedure Name: LMA Insertion Date/Time: 01/07/2023 9:23 AM  Performed by: Elisabeth Cara, CRNAPre-anesthesia Checklist: Patient identified, Emergency Drugs available, Suction available, Patient being monitored and Timeout performed Patient Re-evaluated:Patient Re-evaluated prior to induction Oxygen Delivery Method: Circle system utilized Preoxygenation: Pre-oxygenation with 100% oxygen Induction Type: IV induction LMA: LMA inserted LMA Size: 4.0 Number of attempts: 2 Placement Confirmation: positive ETCO2 and breath sounds checked- equal and bilateral Tube secured with: Tape Dental Injury: Teeth and Oropharynx as per pre-operative assessment  Comments: Smooth IV induction . Lubricated Proseal #4LMA placed with ease. Unable to obtain a good seal. LMA removed. VSS. #4 LMA placed with ease. Good seal obtained. LMA secured in placed

## 2023-01-07 NOTE — Discharge Instructions (Signed)
Removal of the stent: Remove the stent on Monday morning, January 10, 2023 by pulling the string gently as instructed.

## 2023-01-07 NOTE — Interval H&P Note (Signed)
History and Physical Interval Note:  01/07/2023 9:12 AM  Sheila Allen  has presented today for surgery, with the diagnosis of LEFT DISTAL STONE.  The various methods of treatment have been discussed with the patient and family. After consideration of risks, benefits and other options for treatment, the patient has consented to  Procedure(s) with comments: CYSTOSCOPY WITH LEFT  RETROGRADE PYELOGRAM, URETEROSCOPY AND STENT PLACEMENT (Left) - 60 MINS FOR CASE HOLMIUM LASER APPLICATION (Left) as a surgical intervention.  The patient's history has been reviewed, patient examined, no change in status, stable for surgery.  I have reviewed the patient's chart and labs.  Questions were answered to the patient's satisfaction.  [name] is well today.  She has had no cough cold or congestion.  No fever.  No dysuria or gross hematuria.  She reports she has not passed any further stone fragments and continues to have some left lower quadrant and left flank pain.  We discussed she may need a staged procedure with the stent and a follow-up ureteroscopy given all of the fragments that can pass after a shockwave lithotripsy.  Also, if it is safe we will try to get into the kidney to treat any kidney stones.   Jerilee Field

## 2023-01-07 NOTE — Op Note (Signed)
Preoperative diagnosis: Left ureteral stones, left renal stones Postoperative diagnosis: Same  Procedure: Cystoscopy left retrograde pyelogram, left ureteroscopy laser lithotripsy, stone basket extraction, left ureteral stent placement  Surgeon: Mena Goes  Anesthesia: General  Indication for procedure: Sheila Allen is a 67 year old female who had a an 8 mm stone at the left UPJ and a couple of smaller stones in the left kidney.  She underwent a left shockwave lithotripsy of the stone it dropped into the ureter about the proximal to mid level.  The stone failed to completely fragment and passed and she was brought today for above procedure.  Findings: On exam the vulva appeared normal.  Meatus appeared normal.  She had a grade 2 cystocele.  On cystoscopy the bladder was unremarkable.  No stone or foreign body in the bladder.  Left retrograde pyelogram-this outlined a single ureter single collecting system unit with 2 larger stone fragments in the early portion of the left distal ureter.  Minimal proximal hydronephrosis.  Ureteroscopy located a dominant fragment in the distal ureter with a smaller conglomerate or fragments proximal to this.  2 small stones were dusted in the kidney.  Description of procedure: After consent was obtained patient brought to the operating room.  After adequate anesthesia she is placed lithotomy position and prepped in usual sterile fashion.  Timeout was performed to confirm the patient and procedure.  Cystoscope was passed per urethra and the bladder inspected.  Open-ended catheter was used to cannulate the left ureteral orifice and retrograde injection of contrast was performed.  A sensor wire was then advanced and coiled in the upper calyx.  I then passed a dual channel semirigid scope adjacent to the wire where the stones were located.  A 242 m laser fiber was passed and the stones were dusted at 0.2 and 70.  As I withdrew the scope most of the fragments followed and washed  into the bladder.  We used a 0 tip basket to clear few small fragments.  Ureter was clear apart from some edema and submucosal hemorrhage where the stone had been treated with shockwave or settled as it was passing.  Mucosa was intact.  A Glidewire was passed under direct vision and the semirigid backed out.  A short access sheath was advanced without difficulty.  A dual channel digital scope was advanced to the kidney where 2 small stones were noted and they were dusted.  No other fragments or stones to extract from the kidney.  The access sheath was backed out on the ureteroscope the collecting system renal pelvis and ureter were inspected on the way out without any significant fragment or injury noted.  There was some mild submucosal edema and hemorrhage consistent with sheath and scope passage.  No mucosal breach. therefore the wire was backloaded on the cystoscope and a 6 x 26 cm ureteral stent was advanced.  Wire was removed with a good coil seen in the upper calyx and good coil in the bladder.  A string was left on the stent.  Some of the fragments from the bladder were removed and collected for analysis.  She was awakened and taken to the cover room in stable condition.    Complications: None  Blood loss: None  Specimens: Stone fragments to office lab  Drains: 6 x 26 cm left ureteral stent with string  Disposition: Patient stable to PACU

## 2023-01-08 ENCOUNTER — Encounter (HOSPITAL_COMMUNITY): Payer: Self-pay | Admitting: Urology

## 2023-06-20 ENCOUNTER — Other Ambulatory Visit: Payer: Self-pay | Admitting: Adult Health

## 2023-11-14 ENCOUNTER — Encounter: Admitting: Obstetrics and Gynecology

## 2023-11-15 ENCOUNTER — Ambulatory Visit (INDEPENDENT_AMBULATORY_CARE_PROVIDER_SITE_OTHER)

## 2023-11-15 ENCOUNTER — Ambulatory Visit (INDEPENDENT_AMBULATORY_CARE_PROVIDER_SITE_OTHER): Admitting: Podiatry

## 2023-11-15 DIAGNOSIS — M722 Plantar fascial fibromatosis: Secondary | ICD-10-CM | POA: Diagnosis not present

## 2023-11-15 MED ORDER — METHYLPREDNISOLONE 4 MG PO TBPK
ORAL_TABLET | ORAL | 0 refills | Status: DC
Start: 2023-11-15 — End: 2024-02-07

## 2023-11-15 MED ORDER — MELOXICAM 15 MG PO TABS
15.0000 mg | ORAL_TABLET | Freq: Every day | ORAL | 3 refills | Status: DC
Start: 2023-11-15 — End: 2024-03-20

## 2023-11-15 NOTE — Progress Notes (Signed)
 Subjective:  Patient ID: Sheila Allen, female    DOB: Nov 11, 1955,  MRN: [id number] HPI Chief Complaint  Patient presents with   Foot Pain    RM#6 Right heel pain no injuries started hurting 1 month ago hasn't tried anything for pain.    68 y.o. female presents with the above complaint.   ROS: Denies fever chills nausea mobic muscle aches pains calf pain back pain chest pain shortness of breath  Past Medical History:  Diagnosis Date   Complication of anesthesia    Disturbance of skin sensation 11/07/2013   Hip pain, left    History of kidney stones    Hypertension    PONV (postoperative nausea and vomiting)    Past Surgical History:  Procedure Laterality Date   ABDOMINAL HYSTERECTOMY     CHOLECYSTECTOMY  12/2017   CYSTOSCOPY WITH RETROGRADE PYELOGRAM, URETEROSCOPY AND STENT PLACEMENT Left 01/07/2023   Procedure: CYSTOSCOPY WITH LEFT  RETROGRADE PYELOGRAM, URETEROSCOPY AND STENT PLACEMENT;  Surgeon: Christina Coyer, MD;  Location: [facility];  Service: Urology;  Laterality: Left;  60 MINS FOR CASE   EXTRACORPOREAL SHOCK WAVE LITHOTRIPSY Right 01/02/2018   Procedure: RIGHT EXTRACORPOREAL SHOCK WAVE LITHOTRIPSY (ESWL);  Surgeon: Trent Frizzle, MD;  Location: [facility];  Service: Urology;  Laterality: Right;   EXTRACORPOREAL SHOCK WAVE LITHOTRIPSY Left 07/20/2018   Procedure: EXTRACORPOREAL SHOCK WAVE LITHOTRIPSY (ESWL);  Surgeon: Homero Luster, MD;  Location: [facility];  Service: Urology;  Laterality: Left;   EXTRACORPOREAL SHOCK WAVE LITHOTRIPSY Left 01/10/2020   Procedure: EXTRACORPOREAL SHOCK WAVE LITHOTRIPSY (ESWL);  Surgeon: Osborn Blaze, MD;  Location: [facility];  Service: Urology;  Laterality: Left;  NEEDS 75 MIN   EXTRACORPOREAL SHOCK WAVE LITHOTRIPSY Right 02/07/2020   Procedure: EXTRACORPOREAL SHOCK WAVE LITHOTRIPSY (ESWL);  Surgeon: Homero Luster, MD;  Location[facility];  Service: Urology;  Laterality: Right;   EXTRACORPOREAL SHOCK WAVE LITHOTRIPSY  Left 12/13/2022   Procedure: LEFT EXTRACORPOREAL SHOCK WAVE LITHOTRIPSY (ESWL);  Surgeon: Trent Frizzle, MD;  Location: [facility];  Service: Urology;  Laterality: Left;  75 MINUTES NEEDED FOR CASE   HIP SURGERY Left    bursectomy   HOLMIUM LASER APPLICATION Left 01/07/2023   Procedure: HOLMIUM LASER APPLICATION;  Surgeon: Christina Coyer, MD;  Location: [facility];  Service: Urology;  Laterality: Left;   OVARIAN CYST REMOVAL     TONSILLECTOMY      Current Outpatient Medications:    aspirin  EC 81 MG EC tablet, Take 1 tablet (81 mg total) by mouth daily., Disp: , Rfl:    atorvastatin  (LIPITOR) 20 MG tablet, Take 20 mg by mouth daily., Disp: , Rfl: 5   DULoxetine  (CYMBALTA ) 30 MG capsule, TAKE ONE CAPSULE BY MOUTH TWICE DAILY, Disp: 180 capsule, Rfl: 3   estradiol (VIVELLE-DOT) 0.05 MG/24HR patch, Place 1 patch onto the skin 2 (two) times a week., Disp: , Rfl:    meloxicam (MOBIC) 15 MG tablet, Take 1 tablet (15 mg total) by mouth daily., Disp: 30 tablet, Rfl: 3   methylPREDNISolone (MEDROL DOSEPAK) 4 MG TBPK tablet, 6 day dose pack - take as directed, Disp: 21 tablet, Rfl: 0   metoprolol  succinate (TOPROL -XL) 50 MG 24 hr tablet, Take 50 mg by mouth daily. Take with or immediately following a meal., Disp: , Rfl:    Multiple Vitamin (MULTIVITAMIN WITH MINERALS) TABS tablet, Take 1 tablet by mouth daily., Disp: , Rfl:    oxyCODONE  (ROXICODONE ) 5 MG immediate release tablet, Take 1 tablet (5 mg total)  by mouth every 4 (four) hours as needed for severe pain. (Patient not taking: Reported on 11/15/2023), Disp: 15 tablet, Rfl: 0  No Known Allergies Review of Systems Objective:  There were no vitals filed for this visit.  General: Well developed, nourished, in no acute distress, alert and oriented x3   Dermatological: Skin is warm, dry and supple bilateral. Nails x 10 are well maintained; remaining integument appears unremarkable at this time. There are no open sores, no  preulcerative lesions, no rash or signs of infection present.  Vascular: Dorsalis Pedis artery and Posterior Tibial artery pedal pulses are 2/4 bilateral with immedate capillary fill time. Pedal hair growth present. No varicosities and no lower extremity edema present bilateral.   Neruologic: Grossly intact via light touch bilateral. Vibratory intact via tuning fork bilateral. Protective threshold with Semmes Wienstein monofilament intact to all pedal sites bilateral. Patellar and Achilles deep tendon reflexes 2+ bilateral. No Babinski or clonus noted bilateral.   Musculoskeletal: No gross boney pedal deformities bilateral. No pain, crepitus, or limitation noted with foot and ankle range of motion bilateral. Muscular strength 5/5 in all groups tested bilateral.  Pain on palpation medial calcaneal tubercle right heel.  Some nodularity in the posterior aspect of the calcaneus at the Achilles insertion site.  Gait: Unassisted, Nonantalgic.    Radiographs:  Radiographs taken today demonstrate osseously mature individual with good bone mineralization in a rectus foot type accessory peroneal ossicle is noted.  Soft tissue increase in density of plantar fascial calcaneal insertion site is noted.  Assessment & Plan:   Assessment: Plantar fasciitis right heel.  Plan: Discussed etiology pathology conservative surgical therapies at this point injected the heel with 20 mg Kenalog 5 mg Marcaine  to the point of maximal tenderness.  Discussed appropriate shoe gear stretching exercise ice therapy and shoe gear modifications.  I also started her on methylprednisolone to be followed by meloxicam.     Sheila Allen T. [place], [place]

## 2023-11-15 NOTE — Patient Instructions (Signed)
 Plantar Fasciitis: What to Know  Your plantar fascia is a band of thick tissue on the bottom of your foot. It connects your heel bone to the base of your toes. If the fascia gets irritated, it can cause pain in your heel or foot. This is called plantar fasciitis. In some cases, plantar fasciitis can make it hard for you to walk or move. The pain is often worse in the morning after sleeping, or after sitting or lying down for a long time. Pain may also be worse after walking or standing for a long time. What are the causes? Plantar fasciitis may be caused by: Standing for a long time. Wearing shoes that don't have good arch support. Doing high-impact activities. These are things that put stress on your joints. They include: Ballet. Aerobic exercises. These are exercises that make your heart beat faster. Being overweight. Having a way of walking, or gait, that isn't normal. Tight muscles in your calf, which is in the back of your lower leg. High arches in your feet, or flat feet. Starting a new sport or activity. What are the signs or symptoms? The main symptom of plantar fasciitis is heel pain. Your pain may get worse after: You take your first steps after a time of rest. This includes in the morning after you wake up, or after you've been sitting or lying down for a while. Standing still for a long time. Pain may lessen after 30-45 minutes of activity, such as gentle walking. How is this diagnosed? Plantar fasciitis may be diagnosed based on your medical history, your symptoms, and an exam. Your health care provider will check for: A tender spot on the bottom of your foot. A high arch in your foot, or flat feet. Pain when you move your foot. Trouble moving your foot. You may also have tests. These may include: X-rays. Ultrasound. MRI. How is this treated? Treatment depends on how bad your plantar fasciitis is. It may include: RICE therapy. This stands for rest, ice, pressure  (compression), and raising (elevating) the foot. Exercises to stretch your calves and plantar fascia. A night splint. This holds your foot in a stretched, upward position while you sleep. Physical therapy. This can help with symptoms. It can also prevent problems in the future. Shots of a steroid medicine called cortisone. This can help with pain and irritation. Extracorporeal shock wave therapy. This uses electric shocks to stimulate your plantar fascia. If other treatments don't help, you may need to have surgery. Follow these instructions at home: Managing pain, stiffness, and swelling  Use ice, an ice pack, or a frozen bottle of water as told. Place a towel between your skin and the ice. Roll the bottom of your foot over the ice or frozen bottle. Do this for 20 minutes, 2-3 times a day. If your skin turns red, take off the ice right away to prevent skin damage. The risk of damage is higher if you can't feel pain, heat, or cold. Wear shoes that have air-sole or gel-sole cushions. You could also try soft shoe inserts made for plantar fasciitis. Activity Try not to do things that cause pain. Ask what things are safe for you to do. Exercise as told. Try activities that are low impact. This means that they're easier on your joints. They include: Swimming. Water aerobics. Biking. General instructions Take your medicines only as told. Wear a night splint as told. Loosen the splint if your toes tingle, are numb, or turn cold and blue.  Stay at a healthy weight. Work with your provider to lose weight as needed. Contact a health care provider if: Your symptoms don't go away with treatment. You have pain that gets worse. Your pain makes it hard to move or do everyday things. This information is not intended to replace advice given to you by your health care provider. Make sure you discuss any questions you have with your health care provider. Document Revised: 11/22/2022 Document Reviewed:  11/22/2022 Elsevier Patient Education  2024 [employer].

## 2023-12-08 ENCOUNTER — Encounter: Admitting: Obstetrics and Gynecology

## 2023-12-13 ENCOUNTER — Ambulatory Visit: Admitting: Podiatry

## 2024-01-03 ENCOUNTER — Telehealth: Payer: Self-pay | Admitting: Adult Health

## 2024-01-03 NOTE — Telephone Encounter (Signed)
 Pt called stating that the pharmacy is needing a new Rx for this medication. Please advise.

## 2024-01-03 NOTE — Telephone Encounter (Signed)
 Spoke to patient made her aware could not fill prescription for Cymbalta   because patient has not been seen since 2022 Pt has an appointment in 05/2024 and placed patient on wait list for sooner appointment . Made patient aware she can call her PCP to se if they can refill prescription

## 2024-02-07 ENCOUNTER — Ambulatory Visit (INDEPENDENT_AMBULATORY_CARE_PROVIDER_SITE_OTHER): Admitting: Obstetrics and Gynecology

## 2024-02-07 ENCOUNTER — Encounter: Payer: Self-pay | Admitting: Obstetrics and Gynecology

## 2024-02-07 VITALS — BP 112/76 | HR 79 | Ht 64.57 in | Wt 184.8 lb

## 2024-02-07 DIAGNOSIS — E2839 Other primary ovarian failure: Secondary | ICD-10-CM | POA: Diagnosis not present

## 2024-02-07 DIAGNOSIS — Z1231 Encounter for screening mammogram for malignant neoplasm of breast: Secondary | ICD-10-CM

## 2024-02-07 DIAGNOSIS — Z01419 Encounter for gynecological examination (general) (routine) without abnormal findings: Secondary | ICD-10-CM | POA: Diagnosis not present

## 2024-02-07 MED ORDER — ESTRADIOL 0.075 MG/24HR TD PTTW: 1.0000 | MEDICATED_PATCH | TRANSDERMAL | 12 refills | Status: DC

## 2024-02-07 MED ORDER — ESTRADIOL 0.1 MG/24HR TD PTTW: 1.0000 | MEDICATED_PATCH | TRANSDERMAL | 12 refills | Status: DC

## 2024-02-07 NOTE — Progress Notes (Signed)
 68 y.o. y.o. female here for new medicare annual exam. Married sexually active No LMP recorded. Patient has had a hysterectomy.    Pelvic discharge: denies Pelvic pain: denies TAH/BSO for benign reasons. On HRT for 25 years 0.075 estrogen patch. Was taken down to the  0.5 and would like to go back up with hot flashes.  She will see Shasta to manage this and would like her to resume care as she has seen her in the past. Has chtn well controlled on her current medication  Last mammogram: 2024 Last colonoscopy: Reports she has upcoming one in the next few months.  Does not need a referral. Dxa: over a year and a half ago reports normal.  To get records. No fractures Smoker: no Derm: has dermatologist exam q year No GI/GU symptoms. Has small rectocele on exam today. Does not bother her Blood pressure 112/76, pulse 79, height 5' 4.57 (1.64 m), weight 184 lb 12.8 oz (83.8 kg), SpO2 97%. Body mass index is 31.17 kg/m.  Body mass index is 31.17 kg/m.      No data to display          Blood pressure 112/76, pulse 79, height 5' 4.57 (1.64 m), weight 184 lb 12.8 oz (83.8 kg), SpO2 97%.  No results found for: DIAGPAP, HPVHIGH, ADEQPAP  GYN HISTORY: No results found for: DIAGPAP, HPVHIGH, ADEQPAP  OB History  Gravida Para Term Preterm AB Living  2 2 2   2   SAB IAB Ectopic Multiple Live Births      2    # Outcome Date GA Lbr Len/2nd Weight Sex Type Anes PTL Lv  2 Term           1 Term             Past Medical History:  Diagnosis Date   Anxiety    Complication of anesthesia    Disturbance of skin sensation 11/07/2013   Hip pain, left    History of kidney stones    Hypertension    PONV (postoperative nausea and vomiting)     Past Surgical History:  Procedure Laterality Date   ABDOMINAL HYSTERECTOMY     CHOLECYSTECTOMY  12/2017   CYSTOSCOPY WITH RETROGRADE PYELOGRAM, URETEROSCOPY AND STENT PLACEMENT Left 01/07/2023   Procedure: CYSTOSCOPY WITH LEFT   RETROGRADE PYELOGRAM, URETEROSCOPY AND STENT PLACEMENT;  Surgeon: Nieves Cough, MD;  Location: [facility];  Service: Urology;  Laterality: Left;  60 MINS FOR CASE   EXTRACORPOREAL SHOCK WAVE LITHOTRIPSY Right 01/02/2018   Procedure: RIGHT EXTRACORPOREAL SHOCK WAVE LITHOTRIPSY (ESWL);  Surgeon: Matilda Senior, MD;  Location: [facility];  Service: Urology;  Laterality: Right;   EXTRACORPOREAL SHOCK WAVE LITHOTRIPSY Left 07/20/2018   Procedure: EXTRACORPOREAL SHOCK WAVE LITHOTRIPSY (ESWL);  Surgeon: Watt Rush, MD;  Location: [facility];  Service: Urology;  Laterality: Left;   EXTRACORPOREAL SHOCK WAVE LITHOTRIPSY Left 01/10/2020   Procedure: EXTRACORPOREAL SHOCK WAVE LITHOTRIPSY (ESWL);  Surgeon: Alvaro Hummer, MD;  Location[facility];  Service: Urology;  Laterality: Left;  NEEDS 75 MIN   EXTRACORPOREAL SHOCK WAVE LITHOTRIPSY Right 02/07/2020   Procedure: EXTRACORPOREAL SHOCK WAVE LITHOTRIPSY (ESWL);  Surgeon: Watt Rush, MD;  [facility];  Service: Urology;  Laterality: Right;   EXTRACORPOREAL SHOCK WAVE LITHOTRIPSY Left 12/13/2022   Procedure: LEFT EXTRACORPOREAL SHOCK WAVE LITHOTRIPSY (ESWL);  Surgeon: Matilda Senior, MD;  Location: [facility];  Service: Urology;  Laterality: Left;  75 MINUTES NEEDED FOR CASE  HIP SURGERY Left    bursectomy   HOLMIUM LASER APPLICATION Left 01/07/2023   Procedure: HOLMIUM LASER APPLICATION;  Surgeon: Nieves Cough, MD;  Location: [facility];  Service: Urology;  Laterality: Left;   OVARIAN CYST REMOVAL     TONSILLECTOMY      Current Outpatient Medications on File Prior to Visit  Medication Sig Dispense Refill   atorvastatin  (LIPITOR) 20 MG tablet Take 20 mg by mouth daily.  5   DULoxetine  (CYMBALTA ) 30 MG capsule TAKE ONE CAPSULE BY MOUTH TWICE DAILY 180 capsule 3   estradiol  (VIVELLE -DOT) 0.05 MG/24HR patch Place 1 patch onto the skin 2 (two) times a week.     meloxicam  (MOBIC ) 15 MG tablet Take 1 tablet  (15 mg total) by mouth daily. 30 tablet 3   metoprolol  succinate (TOPROL -XL) 50 MG 24 hr tablet Take 50 mg by mouth daily. Take with or immediately following a meal. (Patient not taking: Reported on 02/07/2024)     No current facility-administered medications on file prior to visit.    Social History   Socioeconomic History   Marital status: Married    Spouse name: Nelwyn   Number of children: 2   Years of education: 12   Highest education level: Not on file  Occupational History   Occupation: Retired    Comment: Comptroller   Occupation: PT Lawyer    Comment: [employer]  Tobacco Use   Smoking status: Never    Passive exposure: Past   Smokeless tobacco: Never  Vaping Use   Vaping status: Never Used  Substance and Sexual Activity   Alcohol use: Yes    Alcohol/week: 7.0 standard drinks of alcohol    Types: 7 Glasses of wine per week    Comment: social   Drug use: No   Sexual activity: Yes    Partners: Male    Birth control/protection: Surgical    Comment: Hysterectomy  Other Topics Concern   Not on file  Social History Narrative   Patient lives at home with family.. 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   Right handed.   Education 12 th grade.   Caffeine none    Social Drivers of Corporate investment banker Strain: Not on file  Food Insecurity: Not on file  Transportation Needs: Not on file  Physical Activity: Not on file  Stress: Not on file  Social Connections: Unknown (11/16/2021)   Received from [facility]   Social Network    Social Network: Not on file  Intimate Partner Violence: Unknown (10/08/2021)   Received from [facility]   HITS    Physically Hurt: Not on file    Insult or Talk Down To: Not on file    Threaten Physical Harm: Not on file    Scream or Curse: Not on file    Family History  Problem Relation Age of Onset   Heart disease Mother    Hypertension Mother     Bladder Cancer Mother    Stroke Mother    Heart disease Father    Hypertension Father    Stroke Father    Hypertension Sister    Hypertension Son    Hypertension Brother    Hypertension Sister    Hypertension Sister    Hypertension Brother    Migraines Neg Hx    Seizures Neg Hx      No Known Allergies    Patient's last menstrual period was No  LMP recorded. Patient has had a hysterectomy..            Review of Systems Alls systems reviewed and are negative.     Physical Exam Constitutional:      Appearance: Normal appearance.  Genitourinary:     Vulva normal.     No lesions in the vagina.     Right Labia: No rash, lesions or skin changes.    Left Labia: No lesions, skin changes or rash.    Vaginal cuff intact.    No vaginal discharge or tenderness.     Posterior vaginal prolapse present.    Mild vaginal atrophy present.     Right Adnexa: absent.    Left Adnexa: absent.    Cervix is absent.     Uterus is absent.  Breasts:    Right: Normal.     Left: Normal.  HENT:     Head: Normocephalic.  Neck:     Thyroid: No thyroid mass, thyromegaly or thyroid tenderness.  Cardiovascular:     Rate and Rhythm: Normal rate and regular rhythm.     Heart sounds: Normal heart sounds, S1 normal and S2 normal.  Pulmonary:     Effort: Pulmonary effort is normal.     Breath sounds: Normal breath sounds and air entry.  Abdominal:     General: Bowel sounds are normal. There is no distension.     Palpations: Abdomen is soft. There is no mass.     Tenderness: There is no abdominal tenderness. There is no guarding or rebound.  Musculoskeletal:     Cervical back: Full passive range of motion without pain, normal range of motion and neck supple. No tenderness.     Right lower leg: No edema.     Left lower leg: No edema.  Neurological:     Mental Status: She is alert.  Skin:    General: Skin is warm.  Psychiatric:        Mood and Affect: Mood normal.        Behavior: Behavior  normal.        Thought Content: Thought content normal.  Vitals and nursing note reviewed. Exam conducted with a chaperone present.    Bernice, CMA was present for exam    A:         Well Woman medicare GYN exam                             P:        Pap smear not indicated Encouraged annual mammogram screening Colon cancer screening up-to-date DXA ordered today  Repeat q 2 years. To get records. Referral placed Labs and immunizations to do with PMD To return to 0.075 patch twice weekly and will follow with Jami to manage  Encouraged healthy lifestyle practices Encouraged Vit D and Calcium    No follow-ups on file.  Almarie MARLA Carpen

## 2024-02-07 NOTE — Addendum Note (Signed)
 Addended by: GLENNON ALMARIE POUR on: 02/07/2024 03:20 PM   Modules accepted: Orders

## 2024-02-14 ENCOUNTER — Other Ambulatory Visit (INDEPENDENT_AMBULATORY_CARE_PROVIDER_SITE_OTHER): Payer: Self-pay

## 2024-02-14 ENCOUNTER — Ambulatory Visit: Admitting: Orthopaedic Surgery

## 2024-02-14 DIAGNOSIS — M25561 Pain in right knee: Secondary | ICD-10-CM | POA: Diagnosis not present

## 2024-02-14 MED ORDER — BUPIVACAINE HCL 0.5 % IJ SOLN
2.0000 mL | INTRAMUSCULAR | Status: AC | PRN
Start: 2024-02-14 — End: 2024-02-14
  Administered 2024-02-14 (×2): 2 mL via INTRA_ARTICULAR

## 2024-02-14 MED ORDER — LIDOCAINE HCL 1 % IJ SOLN
2.0000 mL | INTRAMUSCULAR | Status: AC | PRN
  Administered 2024-02-14 (×2): 2 mL

## 2024-02-14 MED ORDER — METHYLPREDNISOLONE ACETATE 40 MG/ML IJ SUSP
40.0000 mg | INTRAMUSCULAR | Status: AC | PRN
  Administered 2024-02-14 (×2): 40 mg via INTRA_ARTICULAR

## 2024-02-14 NOTE — Progress Notes (Signed)
 Office Visit Note   Patient: Sheila Allen           Date of Birth: 1956-04-09           MRN: [id number] Visit Date: 02/14/2024              Requested by: No referring provider defined for this encounter. PCP: Bonner Ade, MD   Assessment & Plan: Visit Diagnoses:  1. Acute pain of right knee     Plan: History of Present Illness Sheila Allen is a 68 year old female who presents with right knee pain.  She has experienced right knee pain for approximately three weeks, beginning after stepping up a large step while carrying luggage. The pain is very uncomfortable but does not prevent her from activities like visiting the zoo with her grandchildren.  The knee feels unstable, particularly when changing directions, [name], locking, [name]  X-rays show no fractures or dislocations.  Physical Exam MUSCULOSKELETAL: No swelling in right knee. Baker's cyst in right popliteal fossa. Good flexibility of right knee. Tenderness in medial joint line.  Results RADIOLOGY Right knee X-ray: No fracture or dislocation; degenerative changes; reduced joint space indicating cartilage wear.  Assessment and Plan Right knee pain with suspected meniscus tear, osteoarthritis, and Baker's cyst Suspected meniscus tear is the primary cause of pain.  X-rays showed minimal arthritic changes possible Baker's cyst noted. - Administered cortisone injection to the right knee. - Recommended wearing a compression knee sleeve for support. - Re-evaluate symptoms in four to six weeks. - If no improvement, order MRI of the right knee.  Follow-Up Instructions: No follow-ups on file.   Orders:  Orders Placed This Encounter  Procedures   XR KNEE 3 VIEW RIGHT   No orders of the defined types were placed in this encounter.      Procedures: Large Joint Inj: R knee on 02/14/2024 12:26 PM Indications: pain Details: 22 G needle  Arthrogram: No  Medications: 40 mg methylPREDNISolone  acetate 40 MG/ML; 2 mL lidocaine  1 %; 2 mL bupivacaine  0.5 % Consent was given by the patient. Patient was prepped and draped in the usual sterile fashion.       Clinical Data: No additional findings.   Subjective: Chief Complaint  Patient presents with   Right Knee - Pain    HPI  Review of Systems  Constitutional: Negative.   HENT: Negative.    Eyes: Negative.   Respiratory: Negative.    Cardiovascular: Negative.   Endocrine: Negative.   Musculoskeletal: Negative.   Neurological: Negative.   Hematological: Negative.   Psychiatric/Behavioral: Negative.    All other systems reviewed and are negative.    Objective: Vital Signs: There were no vitals taken for this visit.  Physical Exam Vitals and nursing note reviewed.  Constitutional:      Appearance: She is well-developed.  HENT:     Head: Atraumatic.     Nose: Nose normal.  Eyes:     Extraocular Movements: Extraocular movements intact.  Cardiovascular:     Pulses: Normal pulses.  Pulmonary:     Effort: Pulmonary effort is normal.  Abdominal:     Palpations: Abdomen is soft.  Musculoskeletal:     Cervical back: Neck supple.  Skin:    General: Skin is warm.     Capillary Refill: Capillary refill takes less than  2 seconds.  Neurological:     Mental Status: She is alert. Mental status is at baseline.  Psychiatric:        Behavior: Behavior normal.        Thought Content: Thought content normal.        Judgment: Judgment normal.     Ortho Exam  Specialty Comments:  No specialty comments available.  Imaging: XR KNEE 3 VIEW RIGHT Result Date: 02/14/2024 X-rays of the right knee show arthritic changes with greater than 50% joint space narrowing of the medial compartment.  No acute abnormalities.    PMFS History: Patient Active Problem List    Diagnosis Date Noted   Disturbance of skin sensation 11/07/2013   Chest pain 07/07/2013   Hypokalemia 07/07/2013   Hypertension 07/07/2013   Chest pain at rest 07/07/2013   Bursitis of left hip 07/20/2011   Past Medical History:  Diagnosis Date   Anxiety    Complication of anesthesia    Disturbance of skin sensation 11/07/2013   Hip pain, left    History of kidney stones    Hypertension    PONV (postoperative nausea and vomiting)     Family History  Problem Relation Age of Onset   Heart disease Mother    Hypertension Mother    Bladder Cancer Mother    Stroke Mother    Heart disease Father    Hypertension Father    Stroke Father    Hypertension Sister    Hypertension Son    Hypertension Brother    Hypertension Sister    Hypertension Sister    Hypertension Brother    Migraines Neg Hx    Seizures Neg Hx     Past Surgical History:  Procedure Laterality Date   ABDOMINAL HYSTERECTOMY     CHOLECYSTECTOMY  12/2017   CYSTOSCOPY WITH RETROGRADE PYELOGRAM, URETEROSCOPY AND STENT PLACEMENT Left 01/07/2023   Procedure: CYSTOSCOPY WITH LEFT  RETROGRADE PYELOGRAM, URETEROSCOPY AND STENT PLACEMENT;  Surgeon: Nieves Cough, MD;  Location: [facility];  Service: Urology;  Laterality: Left;  60 MINS FOR CASE   EXTRACORPOREAL SHOCK WAVE LITHOTRIPSY Right 01/02/2018   Procedure: RIGHT EXTRACORPOREAL SHOCK WAVE LITHOTRIPSY (ESWL);  Surgeon: Matilda Senior, MD;  Location: [facility];  Service: Urology;  Laterality: Right;   EXTRACORPOREAL SHOCK WAVE LITHOTRIPSY Left 07/20/2018   Procedure: EXTRACORPOREAL SHOCK WAVE LITHOTRIPSY (ESWL);  Surgeon: Watt Rush, MD;  Location: [facility];  Service: Urology;  Laterality: Left;   EXTRACORPOREAL SHOCK WAVE LITHOTRIPSY Left 01/10/2020   Procedure: EXTRACORPOREAL SHOCK WAVE LITHOTRIPSY (ESWL);  Surgeon: Alvaro Hummer, MD;  Location: [facility];  Service: Urology;  Laterality: Left;  NEEDS 75 MIN   EXTRACORPOREAL SHOCK WAVE LITHOTRIPSY Right  02/07/2020   Procedure: EXTRACORPOREAL SHOCK WAVE LITHOTRIPSY (ESWL);  Surgeon: Watt Rush, MD;  Location[facility];  Service: Urology;  Laterality: Right;   EXTRACORPOREAL SHOCK WAVE LITHOTRIPSY Left 12/13/2022   Procedure: LEFT EXTRACORPOREAL SHOCK WAVE LITHOTRIPSY (ESWL);  Surgeon: Matilda Senior, MD;  Location: [facility];  Service: Urology;  Laterality: Left;  75 MINUTES NEEDED FOR CASE   HIP SURGERY Left    bursectomy   HOLMIUM LASER APPLICATION Left 01/07/2023   Procedure: HOLMIUM LASER APPLICATION;  Surgeon: Nieves Cough, MD;  Location: [facility];  Service: Urology;  Laterality: Left;   OVARIAN CYST REMOVAL     TONSILLECTOMY     Social History   Occupational History   Occupation: Retired    Comment: Comptroller   Occupation: PT  substitute teacher    Comment: [employer]  Tobacco Use   Smoking status: Never    Passive exposure: Past   Smokeless tobacco: Never  Vaping Use   Vaping status: Never Used  Substance and Sexual Activity   Alcohol use: Yes    Alcohol/week: 7.0 standard drinks of alcohol    Types: 7 Glasses of wine per week    Comment: social   Drug use: No   Sexual activity: Yes    Partners: Male    Birth control/protection: Surgical    Comment: Hysterectomy

## 2024-03-19 ENCOUNTER — Other Ambulatory Visit: Payer: Self-pay | Admitting: Podiatry

## 2024-03-28 ENCOUNTER — Encounter: Payer: Self-pay | Admitting: Adult Health

## 2024-03-28 ENCOUNTER — Ambulatory Visit: Payer: Self-pay | Admitting: Adult Health

## 2024-03-28 VITALS — BP 132/76 | HR 69 | Ht 65.0 in | Wt 178.8 lb

## 2024-03-28 DIAGNOSIS — R209 Unspecified disturbances of skin sensation: Secondary | ICD-10-CM | POA: Diagnosis not present

## 2024-03-28 MED ORDER — DULOXETINE HCL 30 MG PO CPEP: 30.0000 mg | ORAL_CAPSULE | Freq: Two times a day (BID) | ORAL | 3 refills | Status: AC

## 2024-03-28 NOTE — Progress Notes (Signed)
 PATIENT: Sheila Allen DOB: 09-Jul-1955  REASON FOR VISIT: follow up HISTORY FROM: patient  Chief Complaint  Patient presents with   Follow-up    Pt in 4 alone Pt here for Left-sided sensory changes f/u Pt states same since last office visit      HISTORY OF PRESENT ILLNESS: Today 03/28/24:  Sheila Allen is a 68 y.o. female with a history of left-sided sensory changes. Returns today for follow-up.  Patient has not been seen since 2022.  She states that she tried to reduce her dose of Cymbalta  down to 1 tablet a day and for a while she had an abundance of medication which is why she did not make an appointment.  She states that she started developing symptoms so she increased back to twice a day.  She reports that with taking it twice a day she does not have any symptoms.  She returns today for an evaluation.   06/11/21: Sheila Allen is a 68 year old female with a history of left-sided sensory changes.  She returns today for follow-up.  Continues to do well on Cymbalta  30 mg twice a day.  Denies any new symptoms.  Returns today for an evaluation.  06/11/20: Sheila Allen is a 68 year old female with a history of left-sided sensory changes.  She reports that she has been doing well on Cymbalta .  She continues to take Cymbalta  30 mg twice a day.  She reports that there are some nights she only takes 1 tablet.  Reports that she no longer has any symptoms on the left side.  Returns today for an evaluation.  06/11/20: Sheila Allen is a 68 year old female with a history of left-sided sensory changes.  She returns today for her yearly follow-up.  Since the initiation of Cymbalta  she no longer has the symptoms.  She states that she has done relatively well.  She tried reducing Cymbalta  to 1 tablet daily but reports that her symptoms return.  She denies any new symptoms.  She returns today for an evaluation.  HISTORY 06/07/18:   Sheila Allen is a 68 year old female with a history of left-sided sensory  changes.  She returns today for her yearly follow-up.  She reports that Cymbalta  continues to work well for her.  She denies any recurrence of symptoms.  Overall she continues to do well.  She returns today for an evaluation.  REVIEW OF SYSTEMS: Out of a complete 14 system review of symptoms, the patient complains only of the following symptoms, and all other reviewed systems are negative.  ALLERGIES: No Known Allergies  HOME MEDICATIONS: Outpatient Medications Prior to Visit  Medication Sig Dispense Refill   atorvastatin  (LIPITOR) 20 MG tablet Take 20 mg by mouth daily.  5   DULoxetine  (CYMBALTA ) 30 MG capsule TAKE ONE CAPSULE BY MOUTH TWICE DAILY 180 capsule 3   estradiol  (VIVELLE -DOT) 0.1 MG/24HR patch Place 1 patch (0.1 mg total) onto the skin 2 (two) times a week. 8 patch 12   meloxicam  (MOBIC ) 15 MG tablet TAKE 1 TABLET(15 MG) BY MOUTH DAILY 30 tablet 3   metoprolol  succinate (TOPROL -XL) 50 MG 24 hr tablet Take 50 mg by mouth daily. Take with or immediately following a meal.     No facility-administered medications prior to visit.    PAST MEDICAL HISTORY: Past Medical History:  Diagnosis Date   Anxiety    Complication of anesthesia    Disturbance of skin sensation 11/07/2013   Hip pain, left    History of kidney  stones    Hypertension    PONV (postoperative nausea and vomiting)     PAST SURGICAL HISTORY: Past Surgical History:  Procedure Laterality Date   ABDOMINAL HYSTERECTOMY     CHOLECYSTECTOMY  12/2017   CYSTOSCOPY WITH RETROGRADE PYELOGRAM, URETEROSCOPY AND STENT PLACEMENT Left 01/07/2023   Procedure: CYSTOSCOPY WITH LEFT  RETROGRADE PYELOGRAM, URETEROSCOPY AND STENT PLACEMENT;  Surgeon: Nieves Cough, MD;  Location: [facility];  Service: Urology;  Laterality: Left;  60 MINS FOR CASE   EXTRACORPOREAL SHOCK WAVE LITHOTRIPSY Right 01/02/2018   Procedure: RIGHT EXTRACORPOREAL SHOCK WAVE LITHOTRIPSY (ESWL);  Surgeon: Matilda Senior, MD;  Location[facility];  Service:  Urology;  Laterality: Right;   EXTRACORPOREAL SHOCK WAVE LITHOTRIPSY Left 07/20/2018   Procedure: EXTRACORPOREAL SHOCK WAVE LITHOTRIPSY (ESWL);  Surgeon: Watt Rush, MD;  Location: [facility];  Service: Urology;  Laterality: Left;   EXTRACORPOREAL SHOCK WAVE LITHOTRIPSY Left 01/10/2020   Procedure: EXTRACORPOREAL SHOCK WAVE LITHOTRIPSY (ESWL);  Surgeon: Alvaro Hummer, MD;  Location: [facility];  Service: Urology;  Laterality: Left;  NEEDS 75 MIN   EXTRACORPOREAL SHOCK WAVE LITHOTRIPSY Right 02/07/2020   Procedure: EXTRACORPOREAL SHOCK WAVE LITHOTRIPSY (ESWL);  Surgeon: Watt Rush, MD;  Location[facility];  Service: Urology;  Laterality: Right;   EXTRACORPOREAL SHOCK WAVE LITHOTRIPSY Left 12/13/2022   Procedure: LEFT EXTRACORPOREAL SHOCK WAVE LITHOTRIPSY (ESWL);  Surgeon: Matilda Senior, MD;  Location: [facility];  Service: Urology;  Laterality: Left;  75 MINUTES NEEDED FOR CASE   HIP SURGERY Left    bursectomy   HOLMIUM LASER APPLICATION Left 01/07/2023   Procedure: HOLMIUM LASER APPLICATION;  Surgeon: Nieves Cough, MD;  Location: [facility];  Service: Urology;  Laterality: Left;   OVARIAN CYST REMOVAL     TONSILLECTOMY      FAMILY HISTORY: Family History  Problem Relation Age of Onset   Heart disease Mother    Hypertension Mother    Bladder Cancer Mother    Stroke Mother    Heart disease Father    Hypertension Father    Stroke Father    Hypertension Sister    Hypertension Son    Hypertension Brother    Hypertension Sister    Hypertension Sister    Hypertension Brother    Migraines Neg Hx    Seizures Neg Hx     SOCIAL HISTORY: Social History   Socioeconomic History   Marital status: Married    Spouse name: Nelwyn   Number of children: 2   Years of education: 12   Highest education level: Not on file  Occupational History   Occupation: Retired    Comment: Comptroller   Occupation: PT substitute teacher    Comment:   [employer]  Tobacco Use   Smoking status: Never    Passive exposure: Past   Smokeless tobacco: Never  Vaping Use   Vaping status: Never Used  Substance and Sexual Activity   Alcohol use: Yes    Alcohol/week: 2.0 standard drinks of alcohol    Types: 2 Glasses of wine per week    Comment: social   Drug use: No   Sexual activity: Yes    Partners: Male    Birth control/protection: Surgical    Comment: Hysterectomy  Other Topics Concern   Not on file  Social History Narrative   Patient lives at home with family.. 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   Right handed.   Education 12 th grade.  Caffeine none    Social Drivers of Corporate investment banker Strain: Not on file  Food Insecurity: Not on file  Transportation Needs: Not on file  Physical Activity: Not on file  Stress: Not on file  Social Connections: Unknown (11/16/2021)   [facility]   Social Network    Social Network: Not on file  Intimate Partner Violence: Unknown (10/08/2021)   Received from [facility]   HITS    Physically Hurt: Not on file    Insult or Talk Down To: Not on file    Threaten Physical Harm: Not on file    Scream or Curse: Not on file      PHYSICAL EXAM  Vitals:   03/28/24 0820  BP: 132/76  Pulse: 69  Weight: 178 lb 12.8 oz (81.1 kg)  Height: 5' 5 (1.651 m)    Body mass index is 29.75 kg/m.  Generalized: Well developed, in no acute distress   Neurological examination  Mentation: Alert oriented to time, place, history taking. Follows all commands speech and language fluent Cranial nerve II-XII: Pupils were equal round reactive to light. Extraocular movements were full, visual field were full on confrontational test. Head turning and shoulder shrug  were normal and symmetric. Motor: The motor testing reveals 5 over 5 strength of all 4 extremities. Good symmetric motor tone is noted throughout.  Sensory:  Sensory testing is intact to soft touch on all 4 extremities. No evidence of extinction is noted.  Coordination: Cerebellar testing reveals good finger-nose-finger and heel-to-shin bilaterally.  Gait and station: Gait is normal.    DIAGNOSTIC DATA (LABS, IMAGING, TESTING) - I reviewed patient records, labs, notes, testing and imaging myself where available.  Lab Results  Component Value Date   WBC 6.4 12/30/2022   HGB 13.1 12/30/2022   HCT 41.6 12/30/2022   MCV 89.3 12/30/2022   PLT 394 12/30/2022      Component Value Date/Time   NA 138 12/30/2022 1335   K 3.9 12/30/2022 1335   CL 104 12/30/2022 1335   CO2 27 12/30/2022 1335   GLUCOSE 108 (H) 12/30/2022 1335   BUN 12 12/30/2022 1335   CREATININE 0.75 12/30/2022 1335   CALCIUM  8.5 (L) 12/30/2022 1335   PROT 7.1 07/15/2011 1410   ALBUMIN 3.7 07/15/2011 1410   AST 23 07/15/2011 1410   ALT 16 01/01/2014 0739   ALKPHOS 63 07/15/2011 1410   BILITOT 0.6 07/15/2011 1410   GFRNONAA >60 12/30/2022 1335   GFRAA >90 07/07/2013 0941   Lab Results  Component Value Date   CHOL 216 (H) 01/01/2014   HDL 50.00 01/01/2014   LDLCALC 138 (H) 01/01/2014   TRIG 142.0 01/01/2014   CHOLHDL 4 01/01/2014   Lab Results  Component Value Date   HGBA1C 5.6 07/07/2013   Lab Results  Component Value Date   VITAMINB12 454 02/20/2014   No results found for: TSH    ASSESSMENT AND PLAN 68 y.o. year old female  has a past medical history of Anxiety, Complication of anesthesia, Disturbance of skin sensation (11/07/2013), Hip pain, left, History of kidney stones, Hypertension, and PONV (postoperative nausea and vomiting). here with :  1.  Left-sided sensory changes  -Continue Cymbalta  30 mg twice a day -Advised if symptoms worsen or she develops new symptoms she should let us  know -Follow-up in 1 year or sooner if needed     Duwaine Russell, MSN, NP-C 03/28/2024, 8:37 AM [facility] [address]  [place], [place] [address] [phone]

## 2024-03-28 NOTE — Patient Instructions (Signed)
 Your Plan:  Continue Cymbalta  30 mg twice a day     Thank you for coming to see us  at [facility]. I hope we have been able to provide you high quality care today.  You may receive a patient satisfaction survey over the next few weeks. We would appreciate your feedback and comments so that we may continue to improve ourselves and the health of our patients.

## 2024-04-02 ENCOUNTER — Ambulatory Visit
Admission: RE | Admit: 2024-04-02 | Discharge: 2024-04-02 | Disposition: A | Discharge status: Home / Self Care | Source: Ambulatory Visit | Attending: Obstetrics and Gynecology | Admitting: Obstetrics and Gynecology

## 2024-04-02 DIAGNOSIS — Z1231 Encounter for screening mammogram for malignant neoplasm of breast: Secondary | ICD-10-CM

## 2024-04-02 DIAGNOSIS — Z01419 Encounter for gynecological examination (general) (routine) without abnormal findings: Secondary | ICD-10-CM

## 2024-04-04 ENCOUNTER — Ambulatory Visit: Payer: Self-pay | Admitting: Obstetrics and Gynecology

## 2024-04-05 ENCOUNTER — Other Ambulatory Visit: Payer: Self-pay | Admitting: Obstetrics and Gynecology

## 2024-04-05 ENCOUNTER — Ambulatory Visit: Admitting: Physician Assistant

## 2024-04-05 DIAGNOSIS — R928 Other abnormal and inconclusive findings on diagnostic imaging of breast: Secondary | ICD-10-CM

## 2024-04-05 DIAGNOSIS — M25561 Pain in right knee: Secondary | ICD-10-CM | POA: Diagnosis not present

## 2024-04-05 NOTE — Progress Notes (Signed)
 Office Visit Note   Patient: Sheila Allen           Date of Birth: Aug 24, 1955           MRN: [id number] Visit Date: 04/05/2024              Requested by: Bonner Ade, MD [address] [place],  [place] [address] PCP: Bonner Ade, MD   Assessment & Plan: Visit Diagnoses:  1. Acute pain of right knee     Plan: Impression is probable right knee meniscus tear.  Unfortunately, patient only had temporary relief following her cortisone injection.  We will go ahead and order an MRI of the right knee to assess for structural abnormalities.  She will follow-up once this has been completed.  Call with concerns or questions.  Follow-Up Instructions: Return for f/u after MRI.   Orders:  Orders Placed This Encounter  Procedures   MR Knee Right w/o contrast   No orders of the defined types were placed in this encounter.     Procedures: No procedures performed   Clinical Data: No additional findings.   Subjective: Chief Complaint  Patient presents with   Right Knee - Pain    HPI is a very pleasant 68 year old mother-in-law of Sheila Allen who comes in today with recurrent right knee pain.  She was seen in our office by Dr. Jerri approximately a month and a half ago following acute onset of right knee pain after carrying heavy luggage up a set of stairs.  It was thought at the appointment that she may have torn her meniscus.  Intra-articular cortisone injection was performed which unfortunately only temporary help.  Her symptoms have returned.  All of her pain is to the medial aspect of her knee.  Symptoms are worse with stair climbing which she frequently has to do as her bedroom is on the second floor.  She has been taking meloxicam  with some relief.  She has not been wearing her knee brace.  Review of Systems as detailed in HPI.  All others reviewed and are negative.   Objective: Vital Signs: There were no vitals taken for this visit.  Physical Exam  well-developed well-nourished female in no acute distress.  Alert and oriented x 3.  Ortho Exam right knee exam: No effusion.  Range of motion 0 to 120 degrees.  She does have medial joint line tenderness.  She is neurovascularly intact distally.  Specialty Comments:  No specialty comments available.  Imaging: No new imaging   PMFS History: Patient Active Problem List   Diagnosis Date Noted   Disturbance of skin sensation 11/07/2013   Chest pain 07/07/2013   Hypokalemia 07/07/2013   Hypertension 07/07/2013   Chest pain at rest 07/07/2013   Bursitis of left hip 07/20/2011   Past Medical History:  Diagnosis Date   Anxiety    Complication of anesthesia    Disturbance of skin sensation 11/07/2013   Hip pain, left    History of kidney stones    Hypertension    PONV (postoperative nausea and vomiting)     Family History  Problem Relation Age of Onset   Heart disease Mother    Hypertension Mother    Bladder Cancer Mother    Stroke Mother    Heart disease Father    Hypertension Father    Stroke Father    Hypertension Sister    Hypertension Sister    Hypertension Sister    Hypertension Brother  Hypertension Brother    Hypertension Son    Migraines Neg Hx    Seizures Neg Hx    Breast cancer Neg Hx     Past Surgical History:  Procedure Laterality Date   ABDOMINAL HYSTERECTOMY     CHOLECYSTECTOMY  12/2017   CYSTOSCOPY WITH RETROGRADE PYELOGRAM, URETEROSCOPY AND STENT PLACEMENT Left 01/07/2023   Procedure: CYSTOSCOPY WITH LEFT  RETROGRADE PYELOGRAM, URETEROSCOPY AND STENT PLACEMENT;  Surgeon: Nieves Cough, MD;  Location: [facility];  Service: Urology;  Laterality: Left;  60 MINS FOR CASE   EXTRACORPOREAL SHOCK WAVE LITHOTRIPSY Right 01/02/2018   Procedure: RIGHT EXTRACORPOREAL SHOCK WAVE LITHOTRIPSY (ESWL);  Surgeon: Matilda Senior, MD;  Location: [facility];  Service: Urology;  Laterality: Right;   EXTRACORPOREAL SHOCK WAVE LITHOTRIPSY Left 07/20/2018   Procedure:  EXTRACORPOREAL SHOCK WAVE LITHOTRIPSY (ESWL);  Surgeon: Watt Rush, MD;  Location[facility];  Service: Urology;  Laterality: Left;   EXTRACORPOREAL SHOCK WAVE LITHOTRIPSY Left 01/10/2020   Procedure: EXTRACORPOREAL SHOCK WAVE LITHOTRIPSY (ESWL);  Surgeon: Alvaro Hummer, MD;  Location[facility];  Service: Urology;  Laterality: Left;  NEEDS 75 MIN   EXTRACORPOREAL SHOCK WAVE LITHOTRIPSY Right 02/07/2020   Procedure: EXTRACORPOREAL SHOCK WAVE LITHOTRIPSY (ESWL);  Surgeon: Watt Rush, MD;  Location: [facility];  Service: Urology;  Laterality: Right;   EXTRACORPOREAL SHOCK WAVE LITHOTRIPSY Left 12/13/2022   Procedure: LEFT EXTRACORPOREAL SHOCK WAVE LITHOTRIPSY (ESWL);  Surgeon: Matilda Senior, MD;  Location[facility];  Service: Urology;  Laterality: Left;  75 MINUTES NEEDED FOR CASE   HIP SURGERY Left    bursectomy   HOLMIUM LASER APPLICATION Left 01/07/2023   Procedure: HOLMIUM LASER APPLICATION;  Surgeon: Nieves Cough, MD;  Location: [facility];  Service: Urology;  Laterality: Left;   OVARIAN CYST REMOVAL     TONSILLECTOMY     Social History   Occupational History   Occupation: Retired    Comment: Comptroller   Occupation: PT Lawyer    Comment: [employer]  Tobacco Use   Smoking status: Never    Passive exposure: Past   Smokeless tobacco: Never  Vaping Use   Vaping status: Never Used  Substance and Sexual Activity   Alcohol use: Yes    Alcohol/week: 2.0 standard drinks of alcohol    Types: 2 Glasses of wine per week    Comment: social   Drug use: No   Sexual activity: Yes    Partners: Male    Birth control/protection: Surgical    Comment: Hysterectomy

## 2024-04-09 ENCOUNTER — Ambulatory Visit: Payer: Self-pay | Admitting: Physician Assistant

## 2024-04-09 ENCOUNTER — Ambulatory Visit
Admission: RE | Admit: 2024-04-09 | Discharge: 2024-04-09 | Disposition: A | Discharge status: Home / Self Care | Source: Ambulatory Visit | Attending: Physician Assistant | Admitting: Physician Assistant

## 2024-04-09 ENCOUNTER — Telehealth: Payer: Self-pay

## 2024-04-09 DIAGNOSIS — M25561 Pain in right knee: Secondary | ICD-10-CM

## 2024-04-09 NOTE — Telephone Encounter (Signed)
 Tried to call to schedule follow up with Dr.Xu to discuss MRI results. Left voicemail for her to call us  back for scheduling.

## 2024-04-09 NOTE — Progress Notes (Signed)
 Tried to call to schedule follow up with Dr.Xu to discuss MRI results. Left voicemail for her to call us  back.

## 2024-04-09 NOTE — Progress Notes (Signed)
 F/u with xu to go over mri and discuss further treatment options

## 2024-04-10 ENCOUNTER — Ambulatory Visit: Admitting: Orthopaedic Surgery

## 2024-04-10 DIAGNOSIS — M1711 Unilateral primary osteoarthritis, right knee: Secondary | ICD-10-CM | POA: Diagnosis not present

## 2024-04-10 NOTE — Progress Notes (Signed)
 Office Visit Note   Patient: Sheila Allen           Date of Birth: 08-13-1955           MRN: [id number] Visit Date: 04/10/2024              Requested by: Bonner Ade, MD [address] [place],  [place] [address] PCP: Bonner Ade, MD   Assessment & Plan: Visit Diagnoses:  1. Primary osteoarthritis of right knee     Plan: History of Present Illness Sheila Allen is a 68 year old female who presents with persistent knee pain.  She experiences persistent knee pain without mechanical symptoms such as catching or locking. The pain has not significantly improved following a prior injection. An MRI was performed, revealing findings related to the meniscus and bone. The pain has persisted for an extended period, longer than her recent trip to the [place]. She inquired about a Baker's cyst noted on her MyChart.  Results RADIOLOGY Knee MRI: Possible meniscus tear with meniscus extrusion and stress reaction in the bone. Knee X-ray: Moderate osteoarthritis with cartilage loss and Baker's cyst.  Right knee exam is unchanged.  Assessment and Plan Right knee medial meniscus extrusion with possible tear and associated stress reaction MRI shows possible medial meniscus tear with extrusion causing bone stress reaction. Stress reaction expected to resolve. Surgery considered if pain is unbearable, but natural healing preferred. - Consider knee arthroscopy if pain becomes unbearable.  Right knee osteoarthritis with Baker's cyst Moderate osteoarthritis with cartilage loss. Baker's cyst due to arthritis-related fluid leakage. Cyst drainage not recommended due to recurrence. Weight management advised. Knee replacement more reliable than arthroscopy if pain is unbearable. - Maintain a healthy weight to manage osteoarthritis symptoms. - Consider knee replacement surgery if pain becomes unbearable.  Follow-Up Instructions: Return in about 6 weeks (around 05/22/2024).    Orders:  No orders of the defined types were placed in this encounter.  No orders of the defined types were placed in this encounter.     Procedures: No procedures performed   Clinical Data: No additional findings.   Subjective: Chief Complaint  Patient presents with   Other    Right knee follow up to review MRI    HPI  Review of Systems  Constitutional: Negative.   HENT: Negative.    Eyes: Negative.   Respiratory: Negative.    Cardiovascular: Negative.   Endocrine: Negative.   Musculoskeletal: Negative.   Neurological: Negative.   Hematological: Negative.   Psychiatric/Behavioral: Negative.    All other systems reviewed and are negative.    Objective: Vital Signs: There were no vitals taken for this visit.  Physical Exam Vitals and nursing note reviewed.  Constitutional:      Appearance: She is well-developed.  HENT:     Head: Atraumatic.     Nose: Nose normal.  Eyes:     Extraocular Movements: Extraocular movements intact.  Cardiovascular:     Pulses: Normal pulses.  Pulmonary:     Effort: Pulmonary effort is normal.  Abdominal:     Palpations: Abdomen is soft.  Musculoskeletal:     Cervical back: Neck supple.  Skin:    General: Skin is warm.     Capillary Refill: Capillary refill takes less than 2 seconds.  Neurological:     Mental Status: She is alert. Mental status is at baseline.  Psychiatric:        Behavior: Behavior normal.  Thought Content: Thought content normal.        Judgment: Judgment normal.     Ortho Exam  Specialty Comments:  No specialty comments available.  Imaging: No results found.   PMFS History: Patient Active Problem List   Diagnosis Date Noted   Disturbance of skin sensation 11/07/2013   Chest pain 07/07/2013   Hypokalemia 07/07/2013   Hypertension 07/07/2013   Chest pain at rest 07/07/2013   Bursitis of left hip 07/20/2011   Past Medical History:  Diagnosis Date   Anxiety    Complication  of anesthesia    Disturbance of skin sensation 11/07/2013   Hip pain, left    History of kidney stones    Hypertension    PONV (postoperative nausea and vomiting)     Family History  Problem Relation Age of Onset   Heart disease Mother    Hypertension Mother    Bladder Cancer Mother    Stroke Mother    Heart disease Father    Hypertension Father    Stroke Father    Hypertension Sister    Hypertension Sister    Hypertension Sister    Hypertension Brother    Hypertension Brother    Hypertension Son    Migraines Neg Hx    Seizures Neg Hx    Breast cancer Neg Hx     Past Surgical History:  Procedure Laterality Date   ABDOMINAL HYSTERECTOMY     CHOLECYSTECTOMY  12/2017   CYSTOSCOPY WITH RETROGRADE PYELOGRAM, URETEROSCOPY AND STENT PLACEMENT Left 01/07/2023   Procedure: CYSTOSCOPY WITH LEFT  RETROGRADE PYELOGRAM, URETEROSCOPY AND STENT PLACEMENT;  Surgeon: Nieves Cough, MD;  Location: [facility];  Service: Urology;  Laterality: Left;  60 MINS FOR CASE   EXTRACORPOREAL SHOCK WAVE LITHOTRIPSY Right 01/02/2018   Procedure: RIGHT EXTRACORPOREAL SHOCK WAVE LITHOTRIPSY (ESWL);  Surgeon: Matilda Senior, MD;  Location: [facility];  Service: Urology;  Laterality: Right;   EXTRACORPOREAL SHOCK WAVE LITHOTRIPSY Left 07/20/2018   Procedure: EXTRACORPOREAL SHOCK WAVE LITHOTRIPSY (ESWL);  Surgeon: Watt Rush, MD;  Location: [facility];  Service: Urology;  Laterality: Left;   EXTRACORPOREAL SHOCK WAVE LITHOTRIPSY Left 01/10/2020   Procedure: EXTRACORPOREAL SHOCK WAVE LITHOTRIPSY (ESWL);  Surgeon: Alvaro Hummer, MD;  Location: [facility];  Service: Urology;  Laterality: Left;  NEEDS 75 MIN   EXTRACORPOREAL SHOCK WAVE LITHOTRIPSY Right 02/07/2020   Procedure: EXTRACORPOREAL SHOCK WAVE LITHOTRIPSY (ESWL);  Surgeon: Watt Rush, MD;  [facility];  Service: Urology;  Laterality: Right;   EXTRACORPOREAL SHOCK WAVE LITHOTRIPSY Left 12/13/2022   Procedure: LEFT  EXTRACORPOREAL SHOCK WAVE LITHOTRIPSY (ESWL);  Surgeon: Matilda Senior, MD;  Location: [facility];  Service: Urology;  Laterality: Left;  75 MINUTES NEEDED FOR CASE   HIP SURGERY Left    bursectomy   HOLMIUM LASER APPLICATION Left 01/07/2023   Procedure: HOLMIUM LASER APPLICATION;  Surgeon: Nieves Cough, MD;  Location: [facility];  Service: Urology;  Laterality: Left;   OVARIAN CYST REMOVAL     TONSILLECTOMY     Social History   Occupational History   Occupation: Retired    Comment: Comptroller   Occupation: PT Lawyer    Comment: [employer]  Tobacco Use   Smoking status: Never    Passive exposure: Past   Smokeless tobacco: Never  Vaping Use   Vaping status: Never Used  Substance and Sexual Activity   Alcohol use: Yes    Alcohol/week: 2.0 standard drinks of alcohol  Types: 2 Glasses of wine per week    Comment: social   Drug use: No   Sexual activity: Yes    Partners: Male    Birth control/protection: Surgical    Comment: Hysterectomy

## 2024-04-12 ENCOUNTER — Other Ambulatory Visit: Payer: Self-pay | Admitting: Obstetrics and Gynecology

## 2024-04-12 ENCOUNTER — Ambulatory Visit
Admission: RE | Admit: 2024-04-12 | Discharge: 2024-04-12 | Disposition: A | Discharge status: Home / Self Care | Source: Ambulatory Visit | Attending: Obstetrics and Gynecology | Admitting: Obstetrics and Gynecology

## 2024-04-12 DIAGNOSIS — R928 Other abnormal and inconclusive findings on diagnostic imaging of breast: Secondary | ICD-10-CM

## 2024-04-12 DIAGNOSIS — N6323 Unspecified lump in the left breast, lower outer quadrant: Secondary | ICD-10-CM

## 2024-04-13 ENCOUNTER — Ambulatory Visit: Admitting: Physician Assistant

## 2024-04-13 ENCOUNTER — Ambulatory Visit: Payer: Self-pay | Admitting: Obstetrics and Gynecology

## 2024-04-13 ENCOUNTER — Encounter: Payer: Self-pay | Admitting: Physician Assistant

## 2024-04-13 DIAGNOSIS — M1711 Unilateral primary osteoarthritis, right knee: Secondary | ICD-10-CM | POA: Diagnosis not present

## 2024-04-13 MED ORDER — LIDOCAINE HCL 1 % IJ SOLN
2.0000 mL | INTRAMUSCULAR | Status: AC | PRN
  Administered 2024-04-13: 2 mL

## 2024-04-13 MED ORDER — METHYLPREDNISOLONE ACETATE 40 MG/ML IJ SUSP
40.0000 mg | INTRAMUSCULAR | Status: AC | PRN
  Administered 2024-04-13: 40 mg via INTRA_ARTICULAR

## 2024-04-13 MED ORDER — BUPIVACAINE HCL 0.25 % IJ SOLN
2.0000 mL | INTRAMUSCULAR | Status: AC | PRN
  Administered 2024-04-13: 2 mL via INTRA_ARTICULAR

## 2024-04-13 NOTE — Progress Notes (Signed)
 Office Visit Note   Patient: Sheila Allen           Date of Birth: Mar 14, 1956           MRN: [id number] Visit Date: 04/13/2024              Requested by: Bonner Ade, MD [address] [place],  [place] [address] PCP: Bonner Ade, MD   Assessment & Plan: Visit Diagnoses:  1. Primary osteoarthritis of right knee     Plan: Impression is right knee osteoarthritis with extrusion of the medial meniscus.  She is interested in proceeding with cortisone injection today.  I did discuss with the patient she will need to wait 4 weeks before proceeding with knee arthroscopy or 90 days before proceeding with total knee replacement should she decide to proceed with either.  She understands and agrees.  She will follow-up as needed.  Follow-Up Instructions: Return if symptoms worsen or fail to improve.   Orders:  Orders Placed This Encounter  Procedures   Large Joint Inj: R knee   No orders of the defined types were placed in this encounter.     Procedures: Large Joint Inj: R knee on 04/13/2024 3:42 PM Indications: pain Details: 22 G needle, anterolateral approach Medications: 2 mL lidocaine  1 %; 2 mL bupivacaine  0.25 %; 40 mg methylPREDNISolone  acetate 40 MG/ML      Clinical Data: No additional findings.   Subjective: Chief Complaint  Patient presents with   Right Knee - Pain    HPI patient is a pleasant 68 year old female who comes in today with continued right knee pain with recent MRI findings showing medial meniscus tear with extrusion and underlying stress reaction and osteoarthritis.  Previous cortisone injection from approximately 2 months ago provided good but temporary relief.  At her appointment earlier in the week Dr. Jerri offered her a cortisone injection for which she declined.  She is here today to proceed with this.     Objective: Vital Signs: There were no vitals taken for this visit.    Ortho Exam unchanged right knee exam  Specialty  Comments:  No specialty comments available.  Imaging: No new imaging   PMFS History: Patient Active Problem List   Diagnosis Date Noted   Disturbance of skin sensation 11/07/2013   Chest pain 07/07/2013   Hypokalemia 07/07/2013   Hypertension 07/07/2013   Chest pain at rest 07/07/2013   Bursitis of left hip 07/20/2011   Past Medical History:  Diagnosis Date   Anxiety    Complication of anesthesia    Disturbance of skin sensation 11/07/2013   Hip pain, left    History of kidney stones    Hypertension    PONV (postoperative nausea and vomiting)     Family History  Problem Relation Age of Onset   Heart disease Mother    Hypertension Mother    Bladder Cancer Mother    Stroke Mother    Heart disease Father    Hypertension Father    Stroke Father    Hypertension Sister    Hypertension Sister    Hypertension Sister    Hypertension Brother    Hypertension Brother    Hypertension Son    Migraines Neg Hx    Seizures Neg Hx    Breast cancer Neg Hx     Past Surgical History:  Procedure Laterality Date   ABDOMINAL HYSTERECTOMY     CHOLECYSTECTOMY  12/2017   CYSTOSCOPY WITH RETROGRADE PYELOGRAM, URETEROSCOPY AND  STENT PLACEMENT Left 01/07/2023   Procedure: CYSTOSCOPY WITH LEFT  RETROGRADE PYELOGRAM, URETEROSCOPY AND STENT PLACEMENT;  Surgeon: Nieves Cough, MD;  Location: [facility];  Service: Urology;  Laterality: Left;  60 MINS FOR CASE   EXTRACORPOREAL SHOCK WAVE LITHOTRIPSY Right 01/02/2018   Procedure: RIGHT EXTRACORPOREAL SHOCK WAVE LITHOTRIPSY (ESWL);  Surgeon: Matilda Senior, MD;  Location: [facility];  Service: Urology;  Laterality: Right;   EXTRACORPOREAL SHOCK WAVE LITHOTRIPSY Left 07/20/2018   Procedure: EXTRACORPOREAL SHOCK WAVE LITHOTRIPSY (ESWL);  Surgeon: Watt Rush, MD;  Location: [facility];  Service: Urology;  Laterality: Left;   EXTRACORPOREAL SHOCK WAVE LITHOTRIPSY Left 01/10/2020   Procedure: EXTRACORPOREAL SHOCK WAVE LITHOTRIPSY (ESWL);  Surgeon: Alvaro Hummer, MD;  Location: [facility];  Service: Urology;  Laterality: Left;  NEEDS 75 MIN   EXTRACORPOREAL SHOCK WAVE LITHOTRIPSY Right 02/07/2020   Procedure: EXTRACORPOREAL SHOCK WAVE LITHOTRIPSY (ESWL);  Surgeon: Watt Rush, MD;  Location: [facility];  Service: Urology;  Laterality: Right;   EXTRACORPOREAL SHOCK WAVE LITHOTRIPSY Left 12/13/2022   Procedure: LEFT EXTRACORPOREAL SHOCK WAVE LITHOTRIPSY (ESWL);  Surgeon: Matilda Senior, MD;  Location: [facility];  Service: Urology;  Laterality: Left;  75 MINUTES NEEDED FOR CASE   HIP SURGERY Left    bursectomy   HOLMIUM LASER APPLICATION Left 01/07/2023   Procedure: HOLMIUM LASER APPLICATION;  Surgeon: Nieves Cough, MD;  Location: [facility];  Service: Urology;  Laterality: Left;   OVARIAN CYST REMOVAL     TONSILLECTOMY     Social History   Occupational History   Occupation: Retired    Comment: Comptroller   Occupation: PT Lawyer    Comment: [employer]  Tobacco Use   Smoking status: Never    Passive exposure: Past   Smokeless tobacco: Never  Vaping Use   Vaping status: Never Used  Substance and Sexual Activity   Alcohol use: Yes    Alcohol/week: 2.0 standard drinks of alcohol    Types: 2 Glasses of wine per week    Comment: social   Drug use: No   Sexual activity: Yes    Partners: Male    Birth control/protection: Surgical    Comment: Hysterectomy

## 2024-04-19 ENCOUNTER — Encounter: Payer: Self-pay | Admitting: Radiology

## 2024-04-19 ENCOUNTER — Ambulatory Visit: Admitting: Radiology

## 2024-04-19 VITALS — BP 108/64 | HR 74 | Wt 182.0 lb

## 2024-04-19 DIAGNOSIS — Z79890 Hormone replacement therapy (postmenopausal): Secondary | ICD-10-CM

## 2024-04-19 DIAGNOSIS — N958 Other specified menopausal and perimenopausal disorders: Secondary | ICD-10-CM

## 2024-04-19 DIAGNOSIS — E6609 Other obesity due to excess calories: Secondary | ICD-10-CM

## 2024-04-19 DIAGNOSIS — Z6830 Body mass index (BMI) 30.0-30.9, adult: Secondary | ICD-10-CM

## 2024-04-19 DIAGNOSIS — E66811 Obesity, class 1: Secondary | ICD-10-CM

## 2024-04-19 MED ORDER — PROGESTERONE MICRONIZED 100 MG PO CAPS: 100.0000 mg | ORAL_CAPSULE | Freq: Every day | ORAL | 3 refills | Status: AC

## 2024-04-19 MED ORDER — ESTRADIOL 10 MCG VA TABS: 1.0000 | ORAL_TABLET | VAGINAL | 3 refills | Status: AC

## 2024-04-19 MED ORDER — ESTRADIOL 0.1 MG/24HR TD PTTW: 1.0000 | MEDICATED_PATCH | TRANSDERMAL | 3 refills | Status: AC

## 2024-04-19 MED ORDER — ZEPBOUND 2.5 MG/0.5ML ~~LOC~~ SOAJ: 2.5000 mg | SUBCUTANEOUS | 0 refills | Status: DC

## 2024-04-19 NOTE — Progress Notes (Signed)
   Sheila Allen 05/21/1956 [id number]   History:  68 y.o. G2P2 here for med follow up after increasing to 0.1mg  estradiol  patch. Symptoms improved, still having some night sweats and trouble sleeping. Vaginal dryness and urge incontinence as well.  Interested in a GLP-1 to help with weight management. She is exercising 5 days a week, cardio and strength. Following a strict mediterranean diet and still gaining. Concerned because she has HTN. Both parents had a stroke and she would like to avoid that.   Gynecologic History Hysterectomy  Sexually active: yes  Health Maintenance Last mammogram: 04/12/24. Results were: follow up in 6 months   Past medical history, past surgical history, family history and social history were all reviewed and documented in the EPIC chart.  ROS:  A ROS was performed and pertinent positives and negatives are included.  Exam:  Vitals:   04/19/24 0941  BP: 108/64  Pulse: 74  SpO2: 96%  Weight: 182 lb (82.6 kg)   Body mass index is 30.29 kg/m.  Physical Exam Constitutional:      Appearance: Normal appearance. She is obese.  Pulmonary:     Effort: Pulmonary effort is normal.  Neurological:     Mental Status: She is alert.  Psychiatric:        Mood and Affect: Mood normal.        Thought Content: Thought content normal.        Judgment: Judgment normal.      Current weight: 182 Highest weight: 185 Goal weight: 145 First goal: 150  Assessment/Plan:   1. Hormone replacement therapy (HRT) (Primary) Will add progesterone to help with sleep and night sweats - estradiol  (VIVELLE -DOT) 0.1 MG/24HR patch; Place 1 patch (0.1 mg total) onto the skin 2 (two) times a week.  Dispense: 24 patch; Refill: 3 - progesterone (PROMETRIUM) 100 MG capsule; Take 1 capsule (100 mg total) by mouth daily.  Dispense: 90 capsule; Refill: 3  2. Genitourinary syndrome of menopause - Estradiol  10 MCG TABS vaginal tablet; Place 1 tablet (10 mcg total) vaginally 2 (two)  times a week.  Dispense: 24 tablet; Refill: 3  3. Class 1 obesity due to excess calories without serious comorbidity with body mass index (BMI) of 30.0 to 30.9 in adult Risks and benefits reviewed She will continue exercise 5 days a week and following the mediterranean diet - tirzepatide (ZEPBOUND) 2.5 MG/0.5ML Pen; Inject 2.5 mg into the skin once a week.  Dispense: 2 mL; Refill: 0   Follow up in 3 months for weight check  GINETTE COZIER B WHNP-BC 9:56 AM 04/19/2024

## 2024-05-04 ENCOUNTER — Ambulatory Visit: Admitting: Radiology

## 2024-05-07 ENCOUNTER — Ambulatory Visit: Admitting: Adult Health

## 2024-05-07 ENCOUNTER — Encounter: Payer: Self-pay | Admitting: Radiology

## 2024-05-22 ENCOUNTER — Ambulatory Visit: Admitting: Orthopaedic Surgery

## 2024-06-06 ENCOUNTER — Ambulatory Visit: Admitting: Orthopaedic Surgery

## 2024-06-06 ENCOUNTER — Telehealth: Payer: Self-pay

## 2024-06-06 ENCOUNTER — Other Ambulatory Visit: Payer: Self-pay

## 2024-06-06 DIAGNOSIS — M1711 Unilateral primary osteoarthritis, right knee: Secondary | ICD-10-CM

## 2024-06-06 NOTE — Telephone Encounter (Signed)
 Patient given PCP surgical clearance form. Aware that we must receive clearance form back before being able to proceed with scheduling surgery.

## 2024-06-06 NOTE — Progress Notes (Signed)
 Office Visit Note   Patient: Sheila Allen           Date of Birth: Nov 20, 1955           MRN: [id number] Visit Date: 06/06/2024              Requested by: Bonner Ade, MD [address] [place],  [place] [address] PCP: Bonner Ade, MD   Assessment & Plan: Visit Diagnoses:  1. Primary osteoarthritis of right knee     Plan: History of Present Illness Sheila Allen is a 68 year old female who presents with persistent knee pain  She has progressive knee pain that at times reaches 8/10, with brief periods of mild improvement but frequent return to severe pain. She notes locking and sticking without clicking. Two recent steroid injections, one in August and one more recent on 04/13/24, gave minimal benefit, with the latest helping only 1 to 2 days. Pain was significantly limiting during a recent trip after [place].  She lives with her husband and has family support. She drinks alcohol socially and does not smoke. She has no nickel allergy and no history of blood clots.  She underwent knee arthroscopy about 2.5 years ago with good initial relief.  Results RADIOLOGY Knee MRI: Advanced tricompartmental chondromalacia.    Assessment and Plan Primary osteoarthritis of right knee Chronic osteoarthritis with significant pain and functional impairment. MRI shows significant arthritis and extrusion of the medial meniscus without tear. Pain primarily due to arthritis. TKA recommended as a more reliable and predictable means of pain relief - Obtain primary care clearance from Dr. Bernardino Gist. - Schedule surgery 90 days after last injection on October 10th. - Provided handout on knee replacement surgery. - Arrange outpatient physical therapy post-surgery.  Impression is severe right knee degenerative joint disease secondary to Osteoarthritis.  Patient has attempted conservative treatment for at least 6 consecutive weeks within the past 12 weeks, including but not  limited to physical therapy, home exercise program, NSAIDs, activity modification, and/or corticosteroid injections. Despite these efforts, symptoms have not improved or have worsened. Conservative measures have been deemed unsuccessful at this time. After a detailed discussion covering diagnosis and treatment options--including the risks, benefits, alternatives, and potential complications of surgical and nonsurgical management--the patient elected to proceed with surgery  Anticoagulants: No antithrombotic Postop anticoagulation: eliquis Diabetic: Yes  Nickel allergy: No Prior DVT/PE: No Tobacco use: No Clearances needed for surgery: PCP Anticipated discharge dispo: Home   Follow-Up Instructions: No follow-ups on file.   Orders:  Orders Placed This Encounter  Procedures   XR KNEE 3 VIEW RIGHT   No orders of the defined types were placed in this encounter.     Procedures: No procedures performed   Clinical Data: No additional findings.   Subjective: Chief Complaint  Patient presents with   Right Knee - Pain    HPI  Review of Systems  Constitutional: Negative.   HENT: Negative.    Eyes: Negative.   Respiratory: Negative.    Cardiovascular: Negative.   Endocrine: Negative.   Musculoskeletal: Negative.   Neurological: Negative.   Hematological: Negative.   Psychiatric/Behavioral: Negative.    All other systems reviewed and are negative.    Objective: Vital Signs: There were no vitals taken for this visit.  Physical Exam Vitals and nursing note reviewed.  Constitutional:      Appearance: She is well-developed.  HENT:     Head: Atraumatic.     Nose: Nose normal.  Eyes:     Extraocular Movements: Extraocular movements intact.  Cardiovascular:     Pulses: Normal pulses.  Pulmonary:     Effort: Pulmonary effort is normal.  Abdominal:     Palpations: Abdomen is soft.  Musculoskeletal:     Cervical back: Neck supple.  Skin:    General: Skin is warm.      Capillary Refill: Capillary refill takes less than 2 seconds.  Neurological:     Mental Status: She is alert. Mental status is at baseline.  Psychiatric:        Behavior: Behavior normal.        Thought Content: Thought content normal.        Judgment: Judgment normal.     Ortho Exam  Specialty Comments:  No specialty comments available.  Imaging: XR KNEE 3 VIEW RIGHT Result Date: 06/06/2024 X-rays demonstrate severe tricompartmental osteoarthritis.  Bone-on-bone joint space narrowing of medial compartment.  Kellgren-Lawrence stage IV    PMFS History: Patient Active Problem List   Diagnosis Date Noted   Primary osteoarthritis of right knee 06/06/2024   Disturbance of skin sensation 11/07/2013   Chest pain 07/07/2013   Hypokalemia 07/07/2013   Hypertension 07/07/2013   Chest pain at rest 07/07/2013   Bursitis of left hip 07/20/2011   Past Medical History:  Diagnosis Date   Anxiety    Complication of anesthesia    Disturbance of skin sensation 11/07/2013   Hip pain, left    History of kidney stones    Hypertension    PONV (postoperative nausea and vomiting)     Family History  Problem Relation Age of Onset   Heart disease Mother    Hypertension Mother    Bladder Cancer Mother    Stroke Mother    Heart disease Father    Hypertension Father    Stroke Father    Hypertension Sister    Hypertension Sister    Hypertension Sister    Hypertension Brother    Hypertension Brother    Hypertension Son    Migraines Neg Hx    Seizures Neg Hx    Breast cancer Neg Hx     Past Surgical History:  Procedure Laterality Date   ABDOMINAL HYSTERECTOMY     CHOLECYSTECTOMY  12/2017   CYSTOSCOPY WITH RETROGRADE PYELOGRAM, URETEROSCOPY AND STENT PLACEMENT Left 01/07/2023   Procedure: CYSTOSCOPY WITH LEFT  RETROGRADE PYELOGRAM, URETEROSCOPY AND STENT PLACEMENT;  Surgeon: Nieves Cough, MD;  Location: [facility];  Service: Urology;  Laterality: Left;  60 MINS FOR CASE    EXTRACORPOREAL SHOCK WAVE LITHOTRIPSY Right 01/02/2018   Procedure: RIGHT EXTRACORPOREAL SHOCK WAVE LITHOTRIPSY (ESWL);  Surgeon: Matilda Senior, MD;  Location: [facility];  Service: Urology;  Laterality: Right;   EXTRACORPOREAL SHOCK WAVE LITHOTRIPSY Left 07/20/2018   Procedure: EXTRACORPOREAL SHOCK WAVE LITHOTRIPSY (ESWL);  Surgeon: Watt Rush, MD;  Location: [facility];  Service: Urology;  Laterality: Left;   EXTRACORPOREAL SHOCK WAVE LITHOTRIPSY Left 01/10/2020   Procedure: EXTRACORPOREAL SHOCK WAVE LITHOTRIPSY (ESWL);  Surgeon: Alvaro Hummer, MD;  Location[facility];  Service: Urology;  Laterality: Left;  NEEDS 75 MIN   EXTRACORPOREAL SHOCK WAVE LITHOTRIPSY Right 02/07/2020   Procedure: EXTRACORPOREAL SHOCK WAVE LITHOTRIPSY (ESWL);  Surgeon: Watt Rush, MD;  Location: [facility];  Service: Urology;  Laterality: Right;   EXTRACORPOREAL SHOCK WAVE LITHOTRIPSY Left 12/13/2022   Procedure: LEFT EXTRACORPOREAL SHOCK WAVE LITHOTRIPSY (ESWL);  Surgeon: Matilda Senior, MD;  Location: [facility];  Service: Urology;  Laterality: Left;  75 MINUTES NEEDED FOR CASE   HIP SURGERY Left    bursectomy   HOLMIUM LASER APPLICATION Left 01/07/2023   Procedure: HOLMIUM LASER APPLICATION;  Surgeon: Nieves Cough, MD;  Location: [facility];  Service: Urology;  Laterality: Left;   OVARIAN CYST REMOVAL     TONSILLECTOMY     Social History   Occupational History   Occupation: Retired    Comment: Comptroller   Occupation: PT lawyer    Comment: [employer]  Tobacco Use   Smoking status: Never    Passive exposure: Past   Smokeless tobacco: Never  Vaping Use   Vaping status: Never Used  Substance and Sexual Activity   Alcohol use: Yes    Alcohol/week: 2.0 standard drinks of alcohol    Types: 2 Glasses of wine per week    Comment: social   Drug use: No   Sexual activity: Yes    Partners: Male    Birth control/protection: Surgical     Comment: Hysterectomy

## 2024-07-12 ENCOUNTER — Other Ambulatory Visit: Payer: Self-pay | Admitting: Physician Assistant

## 2024-07-12 MED ORDER — ONDANSETRON HCL 4 MG PO TABS: 4.0000 mg | ORAL_TABLET | Freq: Three times a day (TID) | ORAL | 0 refills | Status: AC | PRN

## 2024-07-12 MED ORDER — METHOCARBAMOL 750 MG PO TABS: 750.0000 mg | ORAL_TABLET | Freq: Three times a day (TID) | ORAL | 2 refills | Status: AC | PRN

## 2024-07-12 MED ORDER — OXYCODONE-ACETAMINOPHEN 5-325 MG PO TABS: 1.0000 | ORAL_TABLET | Freq: Four times a day (QID) | ORAL | 0 refills | Status: DC | PRN

## 2024-07-12 MED ORDER — DOCUSATE SODIUM 100 MG PO CAPS: 100.0000 mg | ORAL_CAPSULE | Freq: Every day | ORAL | 2 refills | Status: AC | PRN

## 2024-07-16 ENCOUNTER — Encounter (HOSPITAL_COMMUNITY): Payer: Self-pay

## 2024-07-16 NOTE — Progress Notes (Signed)
 Surgical Instructions   Your procedure is scheduled on Monday, 07/23/24. Report to [facility] Main Entrance A at 9:10 A.M., then check in with the Admitting office. Any questions or running late day of surgery: call [phone]  Questions prior to your surgery date: call [phone], Monday-Friday, 8am-4pm. If you experience any cold or flu symptoms such as cough, fever, chills, shortness of breath, etc. between now and your scheduled surgery, please notify us  at the above number.     Remember:  Do not eat after midnight the night before your surgery  You may drink clear liquids until 8:40 AM, the morning of your surgery.   Clear liquids allowed are: Water, Non-Citrus Juices (without pulp), Carbonated Beverages, Clear Tea (no milk, honey, etc.), Black Coffee Only (NO MILK, CREAM OR POWDERED CREAMER of any kind), and Gatorade.    Take these medicines the morning of surgery with A SIP OF WATER: atorvastatin  (LIPITOR)  metoprolol  succinate (TOPROL -XL) progesterone  (PROMETRIUM )   May take these medicines IF NEEDED: docusate sodium  (COLACE)  methocarbamol  (ROBAXIN )  ondansetron  (ZOFRAN )  oxyCODONE -acetaminophen  (PERCOCET)   Hold Zepbound  7 days prior to surgery.  Last dose on or before Sunday, 07/15/24.    One week prior to surgery, STOP taking any Aspirin  (unless otherwise instructed by your surgeon) Aleve, Naproxen, Ibuprofen, Mobic , Motrin, Advil, Goody's, BC's, all herbal medications, fish oil, and non-prescription vitamins.                     Do NOT Smoke (Tobacco/Vaping) for 24 hours prior to your procedure.  If you use a CPAP at night, you may bring your mask/headgear for your overnight stay.   You will be asked to remove any contacts, glasses, piercing's, hearing aid's, dentures/partials prior to surgery. Please bring cases for these items if needed.    Your surgeon will determine if you are to be admitted or discharged the same day.  Patients discharged the day of  surgery will not be allowed to drive home, and someone needs to stay with them for 24 hours.  SURGICAL WAITING ROOM VISITATION Patients may have no more than 2 support people in the waiting area - these visitors may rotate.   Pre-op nurse will coordinate an appropriate time for 2 ADULT support persons, who may not rotate, to accompany patient in pre-op.  Children under the age of 77 must have an adult with them who is not the patient and must remain in the main waiting area with an adult.  If the patient needs to stay at the hospital during part of their recovery, the visitor guidelines for inpatient rooms apply.  Please refer to the [facility] website for the visitor guidelines for any additional information.   If you received a COVID test during your pre-op visit  it is requested that you wear a mask when out in public, stay away from anyone that may not be feeling well and notify your surgeon if you develop symptoms. If you have been in contact with anyone that has tested positive in the last 10 days please notify you surgeon.      Pre-operative 4 CHG Bathing Instructions   You can play a key role in reducing the risk of infection after surgery. Your skin needs to be as free of germs as possible. You can reduce the number of germs on your skin by washing with CHG (chlorhexidine  gluconate) soap before surgery. CHG is an antiseptic soap that kills germs and continues to kill germs even  after washing.   DO NOT use if you have an allergy to chlorhexidine /CHG or antibacterial soaps. If your skin becomes reddened or irritated, stop using the CHG and notify one of our RNs at [phone].   Please shower with the CHG soap starting 4 days before surgery using the following schedule:     Please keep in mind the following:  DO NOT shave, including legs and underarms, starting the day of your first shower.   You may shave your face at any point before/day of surgery.  Place clean sheets on  your bed the day you start using CHG soap. Use a clean washcloth (not used since being washed) for each shower. DO NOT sleep with pets once you start using the CHG.   CHG Shower Instructions:  Wash your face and private area with normal soap. If you choose to wash your hair, wash first with your normal shampoo.  After you use shampoo/soap, rinse your hair and body thoroughly to remove shampoo/soap residue.  Turn the water OFF and apply  bottle of CHG soap to a CLEAN washcloth.  Apply CHG soap ONLY FROM YOUR NECK DOWN TO YOUR TOES (washing for 3-5 minutes)  DO NOT use CHG soap on face, private areas, open wounds, or sores.  Pay special attention to the area where your surgery is being performed.  If you are having back surgery, having someone wash your back for you may be helpful. Wait 2 minutes after CHG soap is applied, then you may rinse off the CHG soap.  Pat dry with a clean towel  Put on clean clothes/pajamas   If you choose to wear lotion, please use ONLY the CHG-compatible lotions that are listed below.  Additional instructions for the day of surgery:  If you choose, you may shower the morning of surgery with an antibacterial soap.  DO NOT APPLY any lotions, deodorants, cologne, or perfumes.   Do not bring valuables to the hospital. [facility] is not responsible for any belongings/valuables. Do not wear nail polish, gel polish, artificial nails, or any other type of covering on natural nails (fingers and toes) Do not wear jewelry or makeup Put on clean/comfortable clothes.  Please brush your teeth.  Ask your nurse before applying any prescription medications to the skin.     CHG Compatible Lotions   Aveeno Moisturizing lotion  Cetaphil Moisturizing Cream  Cetaphil Moisturizing Lotion  Clairol Herbal Essence Moisturizing Lotion, Dry Skin  Clairol Herbal Essence Moisturizing Lotion, Extra Dry Skin  Clairol Herbal Essence Moisturizing Lotion, Normal Skin  Curel Age  Defying Therapeutic Moisturizing Lotion with Alpha Hydroxy  Curel Extreme Care Body Lotion  Curel Soothing Hands Moisturizing Hand Lotion  Curel Therapeutic Moisturizing Cream, Fragrance-Free  Curel Therapeutic Moisturizing Lotion, Fragrance-Free  Curel Therapeutic Moisturizing Lotion, Original Formula  Eucerin Daily Replenishing Lotion  Eucerin Dry Skin Therapy Plus Alpha Hydroxy Crme  Eucerin Dry Skin Therapy Plus Alpha Hydroxy Lotion  Eucerin Original Crme  Eucerin Original Lotion  Eucerin Plus Crme Eucerin Plus Lotion  Eucerin TriLipid Replenishing Lotion  Keri Anti-Bacterial Hand Lotion  Keri Deep Conditioning Original Lotion Dry Skin Formula Softly Scented  Keri Deep Conditioning Original Lotion, Fragrance Free Sensitive Skin Formula  Keri Lotion Fast Absorbing Fragrance Free Sensitive Skin Formula  Keri Lotion Fast Absorbing Softly Scented Dry Skin Formula  Keri Original Lotion  Keri Skin Renewal Lotion Keri Silky Smooth Lotion  Keri Silky Smooth Sensitive Skin Lotion  Nivea Body Creamy Conditioning Oil  Nivea Body Extra  Enriched Teacher, Adult Education Moisturizing Lotion Nivea Crme  Nivea Skin Firming Lotion  NutraDerm 30 Skin Lotion  NutraDerm Skin Lotion  NutraDerm Therapeutic Skin Cream  NutraDerm Therapeutic Skin Lotion  ProShield Protective Hand Cream  Provon moisturizing lotion  Please read over the following fact sheets that you were given.

## 2024-07-17 ENCOUNTER — Other Ambulatory Visit: Payer: Self-pay | Admitting: Physician Assistant

## 2024-07-17 ENCOUNTER — Inpatient Hospital Stay (HOSPITAL_COMMUNITY)
Admission: RE | Admit: 2024-07-17 | Discharge: 2024-07-17 | Attending: Orthopaedic Surgery | Admitting: Orthopaedic Surgery

## 2024-07-17 ENCOUNTER — Encounter (HOSPITAL_COMMUNITY): Payer: Self-pay

## 2024-07-17 ENCOUNTER — Other Ambulatory Visit: Payer: Self-pay

## 2024-07-17 VITALS — BP 163/87 | HR 77 | Temp 97.8°F | Resp 18 | Ht 66.0 in | Wt 189.8 lb

## 2024-07-17 DIAGNOSIS — M1711 Unilateral primary osteoarthritis, right knee: Secondary | ICD-10-CM | POA: Diagnosis not present

## 2024-07-17 DIAGNOSIS — I10 Essential (primary) hypertension: Secondary | ICD-10-CM | POA: Diagnosis not present

## 2024-07-17 DIAGNOSIS — Z01810 Encounter for preprocedural cardiovascular examination: Secondary | ICD-10-CM | POA: Diagnosis present

## 2024-07-17 DIAGNOSIS — Z01818 Encounter for other preprocedural examination: Secondary | ICD-10-CM | POA: Diagnosis not present

## 2024-07-17 DIAGNOSIS — Z01812 Encounter for preprocedural laboratory examination: Secondary | ICD-10-CM | POA: Diagnosis present

## 2024-07-17 HISTORY — DX: Unspecified osteoarthritis, unspecified site: M19.90

## 2024-07-17 HISTORY — DX: Hyperlipidemia, unspecified: E78.5

## 2024-07-17 HISTORY — DX: Depression, unspecified: F32.A

## 2024-07-17 LAB — BASIC METABOLIC PANEL WITH GFR
Anion gap: 12 (ref 5–15)
BUN: 11 mg/dL (ref 8–23)
CO2: 24 mmol/L (ref 22–32)
Calcium: 8.7 mg/dL — ABNORMAL LOW (ref 8.9–10.3)
Chloride: 104 mmol/L (ref 98–111)
Creatinine, Ser: 0.68 mg/dL (ref 0.44–1.00)
GFR, Estimated: >60 mL/min
Glucose, Bld: 105 mg/dL — ABNORMAL HIGH (ref 70–99)
Potassium: 4 mmol/L (ref 3.5–5.1)
Sodium: 139 mmol/L (ref 135–145)

## 2024-07-17 LAB — CBC
HCT: 42.3 % (ref 36.0–46.0)
Hemoglobin: 13.9 g/dL (ref 12.0–15.0)
MCH: 29 pg (ref 26.0–34.0)
MCHC: 32.9 g/dL (ref 30.0–36.0)
MCV: 88.1 fL (ref 80.0–100.0)
Platelets: 373 K/uL (ref 150–400)
RBC: 4.8 MIL/uL (ref 3.87–5.11)
RDW: 13.1 % (ref 11.5–15.5)
WBC: 6.6 K/uL (ref 4.0–10.5)
nRBC: 0 % (ref 0.0–0.2)

## 2024-07-17 LAB — SURGICAL PCR SCREEN
MRSA, PCR: NEGATIVE
Staphylococcus aureus: NEGATIVE

## 2024-07-17 MED ORDER — APIXABAN 2.5 MG PO TABS: ORAL_TABLET | ORAL | 0 refills | Status: AC

## 2024-07-17 NOTE — Progress Notes (Signed)
 PCP - Dayna Motto, DO Cardiologist - denies  PPM/ICD - denies Device Orders - n/a Rep Notified - n/a  Chest x-ray - denies EKG - 07/17/2024 Stress Test - denies ECHO - denies Cardiac Cath - denies  Sleep Study - denies CPAP - n/a  Fasting Blood Sugar - no DM Checks Blood Sugar _____ times a day  Last dose of GLP1 agonist-  denies GLP1 instructions: n/a  Blood Thinner Instructions: n/a Aspirin  Instructions: n/a  ERAS Protcol -yes PRE-SURGERY Ensure or G2- Ensure  COVID TEST- n/a   Anesthesia review: no   Patient denies shortness of breath, fever, cough and chest pain at PAT appointment   All instructions explained to the patient, with a verbal understanding of the material. Patient agrees to go over the instructions while at home for a better understanding. Patient also instructed to self quarantine after being tested for COVID-19. The opportunity to ask questions was provided.

## 2024-07-17 NOTE — Pre-Procedure Instructions (Addendum)
 Surgical Instructions     Your procedure is scheduled on Monday, January 19th  Report to [facility] Main Entrance A at 9:10 A.M., then check in with the Admitting office. Any questions or running late day of surgery: call [phone]   Questions prior to your surgery date: call [phone], Monday-Friday, 8am-4pm. If you experience any cold or flu symptoms such as cough, fever, chills, shortness of breath, etc. between now and your scheduled surgery, please notify us  at the above number.            Remember:       Do not eat after midnight the night before your surgery   You may drink clear liquids until 8:40 AM, the morning of your surgery.   Clear liquids allowed are: Water, Non-Citrus Juices (without pulp), Carbonated Beverages, Clear Tea (no milk, honey, etc.), Black Coffee Only (NO MILK, CREAM OR POWDERED CREAMER of any kind), and Gatorade.  Patient Instructions  The night before surgery:  No food after midnight. ONLY clear liquids after midnight  The day of surgery (if you do NOT have diabetes):  Drink ONE (1) Pre-Surgery Clear Ensure by 0840 am the morning of surgery. Drink in one sitting. Do not sip.  This drink was given to you during your hospital  pre-op appointment visit.  Nothing else to drink after completing the  Pre-Surgery Clear Ensure.          If you have questions, please contact your surgeons office.           Take these medicines the morning of surgery with A SIP OF WATER: atorvastatin  (LIPITOR)  metoprolol  succinate (TOPROL -XL) progesterone  (PROMETRIUM ) DULoxetine  (CYMBALTA )    May take these medicines IF NEEDED: none     One week prior to surgery, STOP taking any Aspirin  (unless otherwise instructed by your surgeon) Aleve, Naproxen, Ibuprofen, Mobic , Motrin, Advil, Goody's, BC's, all herbal medications, fish oil, and non-prescription vitamins. This includes meloxicam  (MOBIC .)                      Do NOT Smoke (Tobacco/Vaping) for 24 hours  prior to your procedure.   If you use a CPAP at night, you may bring your mask/headgear for your overnight stay.   You will be asked to remove any contacts, glasses, piercing's, hearing aid's, dentures/partials prior to surgery. Please bring cases for these items if needed.    Your surgeon will determine if you are to be admitted or discharged the same day.  Patients discharged the day of surgery will not be allowed to drive home, and someone needs to stay with them for 24 hours.   SURGICAL WAITING ROOM VISITATION Patients may have no more than 2 support people in the waiting area - these visitors may rotate.   Pre-op nurse will coordinate an appropriate time for 2 ADULT support persons, who may not rotate, to accompany patient in pre-op.  Children under the age of 57 must have an adult with them who is not the patient and must remain in the main waiting area with an adult.   If the patient needs to stay at the hospital during part of their recovery, the visitor guidelines for inpatient rooms apply.   Please refer to the [facility] website for the visitor guidelines for any additional information.     If you received a COVID test during your pre-op visit  it is requested that you wear a mask when out in public, stay away from  anyone that may not be feeling well and notify your surgeon if you develop symptoms. If you have been in contact with anyone that has tested positive in the last 10 days please notify you surgeon.         Pre-operative 4 CHG Bathing Instructions    You can play a key role in reducing the risk of infection after surgery. Your skin needs to be as free of germs as possible. You can reduce the number of germs on your skin by washing with CHG (chlorhexidine  gluconate) soap before surgery. CHG is an antiseptic soap that kills germs and continues to kill germs even after washing.    DO NOT use if you have an allergy to chlorhexidine /CHG or antibacterial soaps. If your skin  becomes reddened or irritated, stop using the CHG and notify one of our RNs at [phone].    Please shower with the CHG soap starting 4 days before surgery using the following schedule:       Please keep in mind the following:  DO NOT shave, including legs and underarms, starting the day of your first shower.   You may shave your face at any point before/day of surgery.  Place clean sheets on your bed the day you start using CHG soap. Use a clean washcloth (not used since being washed) for each shower. DO NOT sleep with pets once you start using the CHG.    CHG Shower Instructions:  Wash your face and private area with normal soap. If you choose to wash your hair, wash first with your normal shampoo.  After you use shampoo/soap, rinse your hair and body thoroughly to remove shampoo/soap residue.  Turn the water OFF and apply  bottle of CHG soap to a CLEAN washcloth.  Apply CHG soap ONLY FROM YOUR NECK DOWN TO YOUR TOES (washing for 3-5 minutes)  DO NOT use CHG soap on face, private areas, open wounds, or sores.  Pay special attention to the area where your surgery is being performed.  If you are having back surgery, having someone wash your back for you may be helpful. Wait 2 minutes after CHG soap is applied, then you may rinse off the CHG soap.  Pat dry with a clean towel  Put on clean clothes/pajamas   If you choose to wear lotion, please use ONLY the CHG-compatible lotions that are listed below.   Additional instructions for the day of surgery:   If you choose, you may shower the morning of surgery with an antibacterial soap.  DO NOT APPLY any lotions, deodorants, cologne, or perfumes.   Do not bring valuables to the hospital. [facility] is not responsible for any belongings/valuables. Do not wear nail polish, gel polish, artificial nails, or any other type of covering on natural nails (fingers and toes) Do not wear jewelry or makeup Put on clean/comfortable clothes.   Please brush your teeth.  Ask your nurse before applying any prescription medications to the skin.        CHG Compatible Lotions    Aveeno Moisturizing lotion  Cetaphil Moisturizing Cream  Cetaphil Moisturizing Lotion  Clairol Herbal Essence Moisturizing Lotion, Dry Skin  Clairol Herbal Essence Moisturizing Lotion, Extra Dry Skin  Clairol Herbal Essence Moisturizing Lotion, Normal Skin  Curel Age Defying Therapeutic Moisturizing Lotion with Alpha Hydroxy  Curel Extreme Care Body Lotion  Curel Soothing Hands Moisturizing Hand Lotion  Curel Therapeutic Moisturizing Cream, Fragrance-Free  Curel Therapeutic Moisturizing Lotion, Fragrance-Free  Curel Therapeutic Moisturizing Lotion, Original  Formula  Eucerin Daily Replenishing Lotion  Eucerin Dry Skin Therapy Plus Alpha Hydroxy Crme  Eucerin Dry Skin Therapy Plus Alpha Hydroxy Lotion  Eucerin Original Crme  Eucerin Original Lotion  Eucerin Plus Crme Eucerin Plus Lotion  Eucerin TriLipid Replenishing Lotion  Keri Anti-Bacterial Hand Lotion  Keri Deep Conditioning Original Lotion Dry Skin Formula Softly Scented  Keri Deep Conditioning Original Lotion, Fragrance Free Sensitive Skin Formula  Keri Lotion Fast Absorbing Fragrance Free Sensitive Skin Formula  Keri Lotion Fast Absorbing Softly Scented Dry Skin Formula  Keri Original Lotion  Keri Skin Renewal Lotion Keri Silky Smooth Lotion  Keri Silky Smooth Sensitive Skin Lotion  Nivea Body Creamy Conditioning Oil  Nivea Body Extra Enriched Lotion  Nivea Body Original Lotion  Nivea Body Sheer Moisturizing Lotion Nivea Crme  Nivea Skin Firming Lotion  NutraDerm 30 Skin Lotion  NutraDerm Skin Lotion  NutraDerm Therapeutic Skin Cream  NutraDerm Therapeutic Skin Lotion  ProShield Protective Hand Cream  Provon moisturizing lotion   Please read over the following fact sheets that you were given.

## 2024-07-22 MED ORDER — TRANEXAMIC ACID 1000 MG/10ML IV SOLN
2000.0000 mg | INTRAVENOUS | Status: DC
  Filled 2024-07-22: qty 20

## 2024-07-23 ENCOUNTER — Ambulatory Visit (HOSPITAL_COMMUNITY): Admitting: Certified Registered"

## 2024-07-23 ENCOUNTER — Encounter (HOSPITAL_COMMUNITY): Payer: Self-pay | Admitting: Orthopaedic Surgery

## 2024-07-23 ENCOUNTER — Encounter (HOSPITAL_COMMUNITY): Admission: RE | Disposition: A | Payer: Self-pay | Source: Home / Self Care | Attending: Orthopaedic Surgery

## 2024-07-23 ENCOUNTER — Observation Stay (HOSPITAL_COMMUNITY)

## 2024-07-23 ENCOUNTER — Observation Stay (HOSPITAL_COMMUNITY)
Admission: RE | Admit: 2024-07-23 | Discharge: 2024-07-24 | Disposition: A | Attending: Orthopaedic Surgery | Admitting: Orthopaedic Surgery

## 2024-07-23 DIAGNOSIS — M1711 Unilateral primary osteoarthritis, right knee: Principal | ICD-10-CM | POA: Diagnosis present

## 2024-07-23 DIAGNOSIS — Z96642 Presence of left artificial hip joint: Secondary | ICD-10-CM | POA: Insufficient documentation

## 2024-07-23 DIAGNOSIS — I10 Essential (primary) hypertension: Secondary | ICD-10-CM | POA: Diagnosis not present

## 2024-07-23 DIAGNOSIS — Z79899 Other long term (current) drug therapy: Secondary | ICD-10-CM | POA: Insufficient documentation

## 2024-07-23 DIAGNOSIS — Z96651 Presence of right artificial knee joint: Secondary | ICD-10-CM

## 2024-07-23 MED ORDER — METHOCARBAMOL 1000 MG/10ML IJ SOLN: 500.0000 mg | Freq: Four times a day (QID) | INTRAMUSCULAR | Status: DC | PRN

## 2024-07-23 MED ORDER — ISOPROPYL ALCOHOL 70 % SOLN
Status: DC | PRN
  Administered 2024-07-23: 1 via TOPICAL

## 2024-07-23 MED ORDER — METHOCARBAMOL 500 MG PO TABS
500.0000 mg | ORAL_TABLET | Freq: Four times a day (QID) | ORAL | Status: DC | PRN
  Administered 2024-07-23 – 2024-07-24 (×2): 500 mg via ORAL
  Filled 2024-07-23 (×2): qty 1

## 2024-07-23 MED ORDER — CEFAZOLIN SODIUM-DEXTROSE 2-4 GM/100ML-% IV SOLN
2.0000 g | INTRAVENOUS | Status: AC
  Administered 2024-07-23: 2 g via INTRAVENOUS
  Filled 2024-07-23: qty 100

## 2024-07-23 MED ORDER — VANCOMYCIN HCL 1000 MG IV SOLR
INTRAVENOUS | Status: DC | PRN
  Administered 2024-07-23: 1000 mg via TOPICAL

## 2024-07-23 MED ORDER — GLYCOPYRROLATE PF 0.2 MG/ML IJ SOSY
PREFILLED_SYRINGE | INTRAMUSCULAR | Status: AC
  Filled 2024-07-23: qty 1

## 2024-07-23 MED ORDER — PRONTOSAN WOUND IRRIGATION OPTIME
TOPICAL | Status: DC | PRN
  Administered 2024-07-23: 350 mL via TOPICAL

## 2024-07-23 MED ORDER — ONDANSETRON HCL 4 MG/2ML IJ SOLN: 4.0000 mg | Freq: Four times a day (QID) | INTRAMUSCULAR | Status: DC | PRN

## 2024-07-23 MED ORDER — PHENOL 1.4 % MT LIQD: 1.0000 | OROMUCOSAL | Status: DC | PRN

## 2024-07-23 MED ORDER — FENTANYL CITRATE (PF) 100 MCG/2ML IJ SOLN
INTRAMUSCULAR | Status: AC
  Filled 2024-07-23: qty 2

## 2024-07-23 MED ORDER — OXYCODONE HCL 5 MG/5ML PO SOLN: 5.0000 mg | Freq: Once | ORAL | Status: DC | PRN

## 2024-07-23 MED ORDER — POVIDONE-IODINE 10 % EX SWAB
2.0000 | Freq: Once | CUTANEOUS | Status: AC
  Administered 2024-07-23: 2 via TOPICAL

## 2024-07-23 MED ORDER — DEXAMETHASONE SOD PHOSPHATE PF 10 MG/ML IJ SOLN
INTRAMUSCULAR | Status: AC
  Filled 2024-07-23: qty 1

## 2024-07-23 MED ORDER — FENTANYL CITRATE (PF) 100 MCG/2ML IJ SOLN: 50.0000 ug | Freq: Once | INTRAMUSCULAR | Status: AC

## 2024-07-23 MED ORDER — ESMOLOL HCL 100 MG/10ML IV SOLN
INTRAVENOUS | Status: DC | PRN
  Administered 2024-07-23 (×2): 30 mg via INTRAVENOUS

## 2024-07-23 MED ORDER — BUPIVACAINE IN DEXTROSE 0.75-8.25 % IT SOLN
INTRATHECAL | Status: DC | PRN
  Administered 2024-07-23: 1.8 mL via INTRATHECAL

## 2024-07-23 MED ORDER — FENTANYL CITRATE (PF) 100 MCG/2ML IJ SOLN
INTRAMUSCULAR | Status: AC
  Administered 2024-07-23: 50 ug via INTRAVENOUS
  Filled 2024-07-23: qty 2

## 2024-07-23 MED ORDER — BUPIVACAINE-MELOXICAM ER 400-12 MG/14ML IJ SOLN
INTRAMUSCULAR | Status: DC | PRN
  Administered 2024-07-23: 400 mg

## 2024-07-23 MED ORDER — LIDOCAINE 2% (20 MG/ML) 5 ML SYRINGE
INTRAMUSCULAR | Status: AC
  Filled 2024-07-23: qty 5

## 2024-07-23 MED ORDER — TRANEXAMIC ACID 1000 MG/10ML IV SOLN
INTRAVENOUS | Status: DC | PRN
  Administered 2024-07-23: 2000 mg via TOPICAL

## 2024-07-23 MED ORDER — ONDANSETRON HCL 4 MG/2ML IJ SOLN
INTRAMUSCULAR | Status: AC
  Filled 2024-07-23: qty 2

## 2024-07-23 MED ORDER — DULOXETINE HCL 30 MG PO CPEP
30.0000 mg | ORAL_CAPSULE | Freq: Two times a day (BID) | ORAL | Status: DC
  Administered 2024-07-23 – 2024-07-24 (×2): 30 mg via ORAL
  Filled 2024-07-23 (×2): qty 1

## 2024-07-23 MED ORDER — OXYCODONE HCL 5 MG PO TABS: 5.0000 mg | ORAL_TABLET | Freq: Once | ORAL | Status: DC | PRN

## 2024-07-23 MED ORDER — ACETAMINOPHEN 500 MG PO TABS
1000.0000 mg | ORAL_TABLET | Freq: Four times a day (QID) | ORAL | Status: DC
  Administered 2024-07-23 – 2024-07-24 (×3): 1000 mg via ORAL
  Filled 2024-07-23 (×3): qty 2

## 2024-07-23 MED ORDER — SODIUM CHLORIDE 0.9 % IV SOLN: INTRAVENOUS | Status: DC

## 2024-07-23 MED ORDER — KETOROLAC TROMETHAMINE 15 MG/ML IJ SOLN
7.5000 mg | Freq: Four times a day (QID) | INTRAMUSCULAR | Status: DC
  Administered 2024-07-23 – 2024-07-24 (×3): 7.5 mg via INTRAVENOUS
  Filled 2024-07-23 (×3): qty 1

## 2024-07-23 MED ORDER — METOCLOPRAMIDE HCL 5 MG/ML IJ SOLN: 5.0000 mg | Freq: Three times a day (TID) | INTRAMUSCULAR | Status: DC | PRN

## 2024-07-23 MED ORDER — LIDOCAINE 2% (20 MG/ML) 5 ML SYRINGE
INTRAMUSCULAR | Status: DC | PRN
  Administered 2024-07-23: 40 mg via INTRAVENOUS

## 2024-07-23 MED ORDER — ACETAMINOPHEN 325 MG PO TABS: 325.0000 mg | ORAL_TABLET | Freq: Four times a day (QID) | ORAL | Status: DC | PRN

## 2024-07-23 MED ORDER — PROPOFOL 10 MG/ML IV BOLUS
INTRAVENOUS | Status: DC | PRN
  Administered 2024-07-23: 20 mg via INTRAVENOUS
  Administered 2024-07-23: 30 mg via INTRAVENOUS

## 2024-07-23 MED ORDER — CEFAZOLIN SODIUM-DEXTROSE 2-4 GM/100ML-% IV SOLN
2.0000 g | Freq: Four times a day (QID) | INTRAVENOUS | Status: AC
  Administered 2024-07-23 – 2024-07-24 (×2): 2 g via INTRAVENOUS
  Filled 2024-07-23 (×2): qty 100

## 2024-07-23 MED ORDER — PHENYLEPHRINE HCL-NACL 20-0.9 MG/250ML-% IV SOLN
INTRAVENOUS | Status: DC | PRN
  Administered 2024-07-23: 40 ug/min via INTRAVENOUS

## 2024-07-23 MED ORDER — VANCOMYCIN HCL 1000 MG IV SOLR
INTRAVENOUS | Status: AC
  Filled 2024-07-23: qty 20

## 2024-07-23 MED ORDER — ONDANSETRON HCL 4 MG/2ML IJ SOLN
INTRAMUSCULAR | Status: DC | PRN
  Administered 2024-07-23: 4 mg via INTRAVENOUS

## 2024-07-23 MED ORDER — ROPIVACAINE HCL 5 MG/ML IJ SOLN
INTRAMUSCULAR | Status: DC | PRN
  Administered 2024-07-23: 25 mL via PERINEURAL

## 2024-07-23 MED ORDER — LACTATED RINGERS IV SOLN: INTRAVENOUS | Status: DC

## 2024-07-23 MED ORDER — FENTANYL CITRATE (PF) 100 MCG/2ML IJ SOLN: 25.0000 ug | INTRAMUSCULAR | Status: DC | PRN

## 2024-07-23 MED ORDER — FENTANYL CITRATE (PF) 100 MCG/2ML IJ SOLN
INTRAMUSCULAR | Status: DC | PRN
  Administered 2024-07-23 (×2): 25 ug via INTRAVENOUS

## 2024-07-23 MED ORDER — BUPIVACAINE-MELOXICAM ER 400-12 MG/14ML IJ SOLN
INTRAMUSCULAR | Status: AC
  Filled 2024-07-23: qty 1

## 2024-07-23 MED ORDER — MENTHOL 3 MG MT LOZG: 1.0000 | LOZENGE | OROMUCOSAL | Status: DC | PRN

## 2024-07-23 MED ORDER — DEXAMETHASONE SOD PHOSPHATE PF 10 MG/ML IJ SOLN
INTRAMUSCULAR | Status: DC | PRN
  Administered 2024-07-23: 10 mg via INTRAVENOUS

## 2024-07-23 MED ORDER — SODIUM CHLORIDE 0.9 % IR SOLN
Status: DC | PRN
  Administered 2024-07-23: 1

## 2024-07-23 MED ORDER — TRANEXAMIC ACID-NACL 1000-0.7 MG/100ML-% IV SOLN
1000.0000 mg | INTRAVENOUS | Status: AC
  Administered 2024-07-23: 1000 mg via INTRAVENOUS
  Filled 2024-07-23: qty 100

## 2024-07-23 MED ORDER — GLYCOPYRROLATE PF 0.2 MG/ML IJ SOSY
PREFILLED_SYRINGE | INTRAMUSCULAR | Status: DC | PRN
  Administered 2024-07-23: .2 mg via INTRAVENOUS

## 2024-07-23 MED ORDER — CHLORHEXIDINE GLUCONATE 0.12 % MT SOLN
15.0000 mL | Freq: Once | OROMUCOSAL | Status: AC
  Administered 2024-07-23: 15 mL via OROMUCOSAL
  Filled 2024-07-23: qty 15

## 2024-07-23 MED ORDER — PROPOFOL 500 MG/50ML IV EMUL
INTRAVENOUS | Status: DC | PRN
  Administered 2024-07-23: 85 ug/kg/min via INTRAVENOUS

## 2024-07-23 MED ORDER — PHENYLEPHRINE 80 MCG/ML (10ML) SYRINGE FOR IV PUSH (FOR BLOOD PRESSURE SUPPORT)
PREFILLED_SYRINGE | INTRAVENOUS | Status: DC | PRN
  Administered 2024-07-23: 160 ug via INTRAVENOUS

## 2024-07-23 MED ORDER — OXYCODONE HCL 5 MG PO TABS
5.0000 mg | ORAL_TABLET | Freq: Four times a day (QID) | ORAL | Status: DC | PRN
  Administered 2024-07-24: 5 mg via ORAL
  Filled 2024-07-23: qty 1

## 2024-07-23 MED ORDER — 0.9 % SODIUM CHLORIDE (POUR BTL) OPTIME
TOPICAL | Status: DC | PRN
  Administered 2024-07-23: 1000 mL

## 2024-07-23 MED ORDER — APIXABAN 2.5 MG PO TABS
2.5000 mg | ORAL_TABLET | Freq: Two times a day (BID) | ORAL | Status: DC
  Administered 2024-07-24: 2.5 mg via ORAL
  Filled 2024-07-23 (×2): qty 1

## 2024-07-23 MED ORDER — HYDROMORPHONE HCL 1 MG/ML IJ SOLN: 1.0000 mg | Freq: Three times a day (TID) | INTRAMUSCULAR | Status: DC | PRN

## 2024-07-23 MED ORDER — DEXAMETHASONE SOD PHOSPHATE PF 10 MG/ML IJ SOLN
10.0000 mg | Freq: Once | INTRAMUSCULAR | Status: AC
  Administered 2024-07-24: 10 mg via INTRAVENOUS
  Filled 2024-07-23: qty 1

## 2024-07-23 MED ORDER — TRANEXAMIC ACID-NACL 1000-0.7 MG/100ML-% IV SOLN
1000.0000 mg | Freq: Once | INTRAVENOUS | Status: AC
  Administered 2024-07-23: 1000 mg via INTRAVENOUS
  Filled 2024-07-23: qty 100

## 2024-07-23 MED ORDER — METOCLOPRAMIDE HCL 5 MG PO TABS: 5.0000 mg | ORAL_TABLET | Freq: Three times a day (TID) | ORAL | Status: DC | PRN

## 2024-07-23 MED ORDER — ORAL CARE MOUTH RINSE: 15.0000 mL | Freq: Once | OROMUCOSAL | Status: AC

## 2024-07-23 MED ORDER — OXYCODONE HCL 5 MG PO TABS: 10.0000 mg | ORAL_TABLET | Freq: Four times a day (QID) | ORAL | Status: DC | PRN

## 2024-07-23 MED ORDER — PHENYLEPHRINE 80 MCG/ML (10ML) SYRINGE FOR IV PUSH (FOR BLOOD PRESSURE SUPPORT)
PREFILLED_SYRINGE | INTRAVENOUS | Status: AC
  Filled 2024-07-23: qty 10

## 2024-07-23 MED ORDER — DOCUSATE SODIUM 100 MG PO CAPS
100.0000 mg | ORAL_CAPSULE | Freq: Two times a day (BID) | ORAL | Status: DC
  Administered 2024-07-23 – 2024-07-24 (×2): 100 mg via ORAL
  Filled 2024-07-23 (×2): qty 1

## 2024-07-23 MED ORDER — ONDANSETRON HCL 4 MG PO TABS
4.0000 mg | ORAL_TABLET | Freq: Four times a day (QID) | ORAL | Status: DC | PRN
  Administered 2024-07-24: 4 mg via ORAL
  Filled 2024-07-23: qty 1

## 2024-07-23 NOTE — Progress Notes (Signed)
 Late entry for 1028  Patient became diaphoretic and felt nauseated.  Applied cool washcloth to her forehead and gave her a cottonball with aromatherapy sweet orange and [name] blend.   1037  Nausea has passed.

## 2024-07-23 NOTE — Anesthesia Procedure Notes (Signed)
 Spinal  Patient location during procedure: OR Start time: 07/23/2024 12:05 PM End time: 07/23/2024 12:08 PM Reason for block: surgical anesthesia  Staffing Performed: anesthesiologist  Authorized by: Maryclare Cornet, MD   Performed by: Maryclare Cornet, MD  Preanesthetic Checklist Completed: patient identified, IV checked, risks and benefits discussed, surgical consent, monitors and equipment checked, pre-op evaluation and timeout performed Spinal Block Patient position: sitting Prep: DuraPrep Patient monitoring: cardiac monitor, continuous pulse ox and blood pressure Approach: midline Location: L3-4 Injection technique: single-shot Needle Needle type: Pencan  Needle gauge: 24 G Needle length: 9 cm Assessment Sensory level: T10 Events: CSF return  Additional Notes Functioning IV was confirmed and monitors were applied. Sterile prep and drape, including hand hygiene and sterile gloves were used. The patient was positioned and the spine was prepped. The skin was anesthetized with lidocaine .  Free flow of clear CSF was obtained prior to injecting local anesthetic into the CSF.  The spinal needle aspirated freely following injection.  The needle was carefully withdrawn.  The patient tolerated the procedure well.

## 2024-07-23 NOTE — Anesthesia Procedure Notes (Signed)
 Anesthesia Regional Block: Adductor canal block   Pre-Anesthetic Checklist: , timeout performed,  Correct Patient, Correct Site, Correct Laterality,  Correct Procedure, Correct Position, site marked,  Risks and benefits discussed,  Surgical consent,  Pre-op evaluation,  At surgeon's request and post-op pain management  Laterality: Right  Prep: chloraprep       Needles:  Injection technique: Single-shot  Needle Type: Echogenic Needle     Needle Length: 9cm  Needle Gauge: 21     Additional Needles:   Narrative:  Start time: 07/23/2024 10:17 AM End time: 07/23/2024 10:23 AM Injection made incrementally with aspirations every 5 mL.  Performed by: Personally  Anesthesiologist: Maryclare Cornet, MD  Additional Notes: Pt tolerated the procedure well.

## 2024-07-23 NOTE — Evaluation (Signed)
 Physical Therapy Evaluation Patient Details Name: Sheila Allen MRN: [id number] DOB: 1955/12/06 Today's Date: 07/23/2024  History of Present Illness  Pt is 69 year old presented to [facility] on  07/23/24 for rt TKR. PMH - htn, anxiety, depression, arthritis  Clinical Impression  Pt admitted with above diagnosis and presents to PT with functional limitations due to deficits listed below (See PT problem list). Pt needs skilled PT to maximize independence and safety. Pt limited to scoot transfer to chair due to spinal anesthesia still in effect and pt unable to stand. Moves well to EOB and expect once anesthesia has worn off she will be ambulatory with walker without difficulty. Pt plans to return home with husband at dc.           If plan is discharge home, recommend the following: A little help with walking and/or transfers;A little help with bathing/dressing/bathroom;Assistance with cooking/housework;Assist for transportation;Help with stairs or ramp for entrance   Can travel by private vehicle        Equipment Recommendations Rolling walker (2 wheels);BSC/3in1  Recommendations for Other Services       Functional Status Assessment Patient has had a recent decline in their functional status and demonstrates the ability to make significant improvements in function in a reasonable and predictable amount of time.     Precautions / Restrictions Precautions Precautions: Knee Restrictions Weight Bearing Restrictions Per Provider Order: Yes RLE Weight Bearing Per Provider Order: Weight bearing as tolerated      Mobility  Bed Mobility Overal bed mobility: Needs Assistance Bed Mobility: Supine to Sit     Supine to sit: Contact guard, HOB elevated          Transfers Overall transfer level: Needs assistance   Transfers: Bed to chair/wheelchair/BSC            Lateral/Scoot Transfers: Min assist General transfer comment: Attempted to stand x 2 but pt unable do to effects of  spinal anesthesia had not worn off enough. Lateral scoot transfer bed to chair    Ambulation/Gait                  Stairs            Wheelchair Mobility     Tilt Bed    Modified Rankin (Stroke Patients Only)       Balance Overall balance assessment: Mild deficits observed, not formally tested                                           Pertinent Vitals/Pain Pain Assessment Pain Assessment: No/denies pain    Home Living Family/patient expects to be discharged to:: Private residence Living Arrangements: Spouse/significant other Available Help at Discharge: Family;Available 24 hours/day Type of Home: House Home Access: Stairs to enter Entrance Stairs-Rails: Doctor, General Practice of Steps: 4   Home Layout: Two level;Bed/bath upstairs        Prior Function Prior Level of Function : Independent/Modified Independent;Driving             Mobility Comments: No assistive device       Extremity/Trunk Assessment   Upper Extremity Assessment Upper Extremity Assessment: Defer to OT evaluation    Lower Extremity Assessment Lower Extremity Assessment: RLE deficits/detail RLE Deficits / Details: Good quad strength, knee AAROM 0-95       Communication   Communication Communication: No apparent difficulties  Cognition Arousal: Alert Behavior During Therapy: WFL for tasks assessed/performed   PT - Cognitive impairments: No apparent impairments                         Following commands: Intact       Cueing Cueing Techniques: Verbal cues     General Comments      Exercises Total Joint Exercises Ankle Circles/Pumps: AROM, Both, 5 reps, Supine Quad Sets: AROM, Right, 5 reps, Supine Heel Slides: AAROM, Right, 5 reps, Supine Hip ABduction/ADduction: AAROM, Right, 5 reps, Supine Straight Leg Raises: AAROM, Right, 5 reps, Supine Long Arc Quad: AAROM, Right, 5 reps, Seated Knee Flexion: AAROM, Right, 5  reps, Seated Goniometric ROM: 0-95   Assessment/Plan    PT Assessment Patient needs continued PT services  PT Problem List Decreased strength;Decreased range of motion;Decreased mobility;Decreased knowledge of use of DME       PT Treatment Interventions DME instruction;Gait training;Stair training;Functional mobility training;Therapeutic activities;Therapeutic exercise;Patient/family education    PT Goals (Current goals can be found in the Care Plan section)  Acute Rehab PT Goals Patient Stated Goal: return home PT Goal Formulation: With patient Time For Goal Achievement: 07/27/24 Potential to Achieve Goals: Good    Frequency 7X/week     Co-evaluation               AM-PAC PT 6 Clicks Mobility  Outcome Measure Help needed turning from your back to your side while in a flat bed without using bedrails?: A Little Help needed moving from lying on your back to sitting on the side of a flat bed without using bedrails?: A Little Help needed moving to and from a bed to a chair (including a wheelchair)?: A Little Help needed standing up from a chair using your arms (e.g., wheelchair or bedside chair)?: Total Help needed to walk in hospital room?: Total Help needed climbing 3-5 steps with a railing? : Total 6 Click Score: 12    End of Session Equipment Utilized During Treatment: Gait belt Activity Tolerance: Patient tolerated treatment well Patient left: in chair;with call bell/phone within reach Nurse Communication: Mobility status PT Visit Diagnosis: Other abnormalities of gait and mobility (R26.89);Difficulty in walking, not elsewhere classified (R26.2)    Time: 8273-8247 PT Time Calculation (min) (ACUTE ONLY): 26 min   Charges:   PT Evaluation $PT Eval Low Complexity: 1 Low PT Treatments $Therapeutic Exercise: 8-22 mins PT General Charges $$ ACUTE PT VISIT: 1 Visit         [facility] PT Acute Rehabilitation Services Office [phone]   [facility] ORN  [facility] 07/23/2024, 7:05 PM

## 2024-07-23 NOTE — Plan of Care (Signed)
" °  Problem: Education: Goal: Knowledge of General Education information will improve Description: Including pain rating scale, medication(s)/side effects and non-pharmacologic comfort measures Outcome: Progressing   Problem: Health Behavior/Discharge Planning: Goal: Ability to manage health-related needs will improve Outcome: Progressing   Problem: Clinical Measurements: Goal: Ability to maintain clinical measurements within normal limits will improve Outcome: Progressing Goal: Will remain free from infection Outcome: Progressing Goal: Diagnostic test results will improve Outcome: Progressing Goal: Respiratory complications will improve Outcome: Progressing Goal: Cardiovascular complication will be avoided Outcome: Progressing   Problem: Activity: Goal: Risk for activity intolerance will decrease Outcome: Progressing   Problem: Nutrition: Goal: Adequate nutrition will be maintained Outcome: Progressing   Problem: Coping: Goal: Level of anxiety will decrease Outcome: Progressing   Problem: Elimination: Goal: Will not experience complications related to bowel motility Outcome: Progressing Goal: Will not experience complications related to urinary retention Outcome: Progressing   Problem: Pain Managment: Goal: General experience of comfort will improve and/or be controlled Outcome: Progressing   Problem: Safety: Goal: Ability to remain free from injury will improve Outcome: Progressing   Problem: Skin Integrity: Goal: Risk for impaired skin integrity will decrease Outcome: Progressing   Problem: Education: Goal: Knowledge of the prescribed therapeutic regimen will improve Outcome: Progressing   Problem: Bowel/Gastric: Goal: Gastrointestinal status for postoperative course will improve Outcome: Progressing   Problem: Cardiac: Goal: Ability to maintain an adequate cardiac output Outcome: Progressing Goal: Will show no evidence of cardiac arrhythmias Outcome:  Progressing   Problem: Nutritional: Goal: Will attain and maintain optimal nutritional status Outcome: Progressing   Problem: Neurological: Goal: Will regain or maintain usual level of consciousness Outcome: Progressing   Problem: Clinical Measurements: Goal: Ability to maintain clinical measurements within normal limits Outcome: Progressing Goal: Postoperative complications will be avoided or minimized Outcome: Progressing   Problem: Respiratory: Goal: Will regain and/or maintain adequate ventilation Outcome: Progressing Goal: Respiratory status will improve Outcome: Progressing   Problem: Skin Integrity: Goal: Demonstrates signs of wound healing without infection Outcome: Progressing   Problem: Urinary Elimination: Goal: Will remain free from infection Outcome: Progressing Goal: Ability to achieve and maintain adequate urine output Outcome: Progressing   Problem: Education: Goal: Knowledge of the prescribed therapeutic regimen will improve Outcome: Progressing Goal: Individualized Educational Video(s) Outcome: Progressing   Problem: Activity: Goal: Ability to avoid complications of mobility impairment will improve Outcome: Progressing Goal: Range of joint motion will improve Outcome: Progressing   Problem: Clinical Measurements: Goal: Postoperative complications will be avoided or minimized Outcome: Progressing   Problem: Pain Management: Goal: Pain level will decrease with appropriate interventions Outcome: Progressing   Problem: Skin Integrity: Goal: Will show signs of wound healing Outcome: Progressing   "

## 2024-07-23 NOTE — Anesthesia Preprocedure Evaluation (Signed)
"                                    Anesthesia Evaluation  Patient identified by MRN, date of birth, ID band Patient awake    Reviewed: Allergy & Precautions, H&P , NPO status , Patient's Chart, lab work & pertinent test results  History of Anesthesia Complications (+) PONV and history of anesthetic complications  Airway Mallampati: II   Neck ROM: full    Dental   Pulmonary neg pulmonary ROS   breath sounds clear to auscultation       Cardiovascular hypertension,  Rhythm:regular Rate:Normal     Neuro/Psych  PSYCHIATRIC DISORDERS Anxiety Depression       GI/Hepatic   Endo/Other    Renal/GU stones     Musculoskeletal  (+) Arthritis ,    Abdominal   Peds  Hematology   Anesthesia Other Findings   Reproductive/Obstetrics                              Anesthesia Physical Anesthesia Plan  ASA: 2  Anesthesia Plan: MAC and Spinal   Post-op Pain Management: Regional block*   Induction: Intravenous  PONV Risk Score and Plan: 3 and Propofol  infusion and Treatment may vary due to age or medical condition  Airway Management Planned: Simple Face Mask  Additional Equipment:   Intra-op Plan:   Post-operative Plan:   Informed Consent: I have reviewed the patients History and Physical, chart, labs and discussed the procedure including the risks, benefits and alternatives for the proposed anesthesia with the patient or authorized representative who has indicated his/her understanding and acceptance.     Dental advisory given  Plan Discussed with: CRNA, Anesthesiologist and Surgeon  Anesthesia Plan Comments:         Anesthesia Quick Evaluation  "

## 2024-07-23 NOTE — H&P (Signed)
 "   PREOPERATIVE H&P  Chief Complaint: right knee osteoarthritis  HPI: Sheila Allen is a 69 y.o. female who presents for surgical treatment of right knee osteoarthritis.  She denies any changes in medical history.  Past Surgical History:  Procedure Laterality Date   ABDOMINAL HYSTERECTOMY     CHOLECYSTECTOMY  12/2017   CYSTOSCOPY WITH RETROGRADE PYELOGRAM, URETEROSCOPY AND STENT PLACEMENT Left 01/07/2023   Procedure: CYSTOSCOPY WITH LEFT  RETROGRADE PYELOGRAM, URETEROSCOPY AND STENT PLACEMENT;  Surgeon: Nieves Cough, MD;  Location: [facility];  Service: Urology;  Laterality: Left;  60 MINS FOR CASE   EXTRACORPOREAL SHOCK WAVE LITHOTRIPSY Right 01/02/2018   Procedure: RIGHT EXTRACORPOREAL SHOCK WAVE LITHOTRIPSY (ESWL);  Surgeon: Matilda Senior, MD;  Location: [facility];  Service: Urology;  Laterality: Right;   EXTRACORPOREAL SHOCK WAVE LITHOTRIPSY Left 07/20/2018   Procedure: EXTRACORPOREAL SHOCK WAVE LITHOTRIPSY (ESWL);  Surgeon: Watt Rush, MD;  Location: [facility];  Service: Urology;  Laterality: Left;   EXTRACORPOREAL SHOCK WAVE LITHOTRIPSY Left 01/10/2020   Procedure: EXTRACORPOREAL SHOCK WAVE LITHOTRIPSY (ESWL);  Surgeon: Alvaro Hummer, MD;  Location: [facility];  Service: Urology;  Laterality: Left;  NEEDS 75 MIN   EXTRACORPOREAL SHOCK WAVE LITHOTRIPSY Right 02/07/2020   Procedure: EXTRACORPOREAL SHOCK WAVE LITHOTRIPSY (ESWL);  Surgeon: Watt Rush, MD;  Location: [facility];  Service: Urology;  Laterality: Right;   EXTRACORPOREAL SHOCK WAVE LITHOTRIPSY Left 12/13/2022   Procedure: LEFT EXTRACORPOREAL SHOCK WAVE LITHOTRIPSY (ESWL);  Surgeon: Matilda Senior, MD;  Location: [facility];  Service: Urology;  Laterality: Left;  75 MINUTES NEEDED FOR CASE   HIP SURGERY Left    bursectomy   HOLMIUM LASER APPLICATION Left 01/07/2023   Procedure: HOLMIUM LASER APPLICATION;  Surgeon: Nieves Cough, MD;  Location: [facility];  Service: Urology;   Laterality: Left;   OVARIAN CYST REMOVAL     TONSILLECTOMY     Social History   Socioeconomic History   Marital status: Married    Spouse name: Nelwyn   Number of children: 2   Years of education: 12   Highest education level: Not on file  Occupational History   Occupation: Retired    Comment: Comptroller   Occupation: PT lawyer    Comment: [employer]  Tobacco Use   Smoking status: Never    Passive exposure: Past   Smokeless tobacco: Never  Vaping Use   Vaping status: Never Used  Substance and Sexual Activity   Alcohol  use: Yes    Alcohol /week: 2.0 standard drinks of alcohol     Types: 2 Glasses of wine per week    Comment: social   Drug use: No   Sexual activity: Yes    Partners: Male    Birth control/protection: Surgical    Comment: Hysterectomy  Other Topics Concern   Not on file  Social History Narrative   Patient lives at home with family.. Patient is married Cathy)   Retired [employer]. Part time [employer].   Right handed.   Education 12 th grade.   Caffeine none    Social Drivers of Health   Tobacco Use: Low Risk (07/23/2024)   Patient History    Smoking Tobacco Use: Never    Smokeless Tobacco Use: Never    Passive Exposure: Past  Financial Resource Strain: Not on file  Food Insecurity: Not on file  Transportation Needs: Not on file  Physical Activity: Not on file  Stress: Not on file  Social Connections: Unknown (11/16/2021)  [facility]   Social Network    Social Network: Not on file  Depression (PHQ2-9): Not on file  Alcohol  Screen: Not on file  Housing: Not on file  Utilities: Not on file  Health Literacy: Not on file   Family History  Problem Relation Age of Onset   Heart disease Mother    Hypertension Mother    Bladder Cancer Mother    Stroke Mother    Heart disease Father    Hypertension Father    Stroke Father    Hypertension Sister    Hypertension Sister    Hypertension  Sister    Hypertension Brother    Hypertension Brother    Hypertension Son    Migraines Neg Hx    Seizures Neg Hx    Breast cancer Neg Hx    Allergies[1] Prior to Admission medications  Medication Sig Start Date End Date Taking? Authorizing Provider  atorvastatin  (LIPITOR) 20 MG tablet Take 20 mg by mouth daily. 10/21/15  Yes [provider]  docusate sodium  (COLACE) 100 MG capsule Take 1 capsule (100 mg total) by mouth daily as needed. 07/12/24 07/12/25  Jule Ronal CROME, PA-C  DULoxetine  (CYMBALTA ) 30 MG capsule Take 1 capsule (30 mg total) by mouth 2 (two) times daily. 03/28/24  Yes Millikan, Megan, NP  estradiol  (VIVELLE -DOT) 0.1 MG/24HR patch Place 1 patch (0.1 mg total) onto the skin 2 (two) times a week. 04/19/24  Yes Chrzanowski, Jami B, NP  meloxicam  (MOBIC ) 15 MG tablet TAKE 1 TABLET(15 MG) BY MOUTH DAILY 03/20/24  Yes Hyatt, Max T, DPM  methocarbamol  (ROBAXIN ) 750 MG tablet Take 1 tablet (750 mg total) by mouth 3 (three) times daily as needed. 07/12/24   Jule Ronal CROME, PA-C  metoprolol  succinate (TOPROL -XL) 50 MG 24 hr tablet Take 50 mg by mouth daily. Take with or immediately following a meal.   Yes [provider]  ondansetron  (ZOFRAN ) 4 MG tablet Take 1 tablet (4 mg total) by mouth every 8 (eight) hours as needed for nausea or vomiting. 07/12/24   Jule Ronal CROME, PA-C  oxyCODONE -acetaminophen  (PERCOCET) 5-325 MG tablet Take 1-2 tablets by mouth every 6 (six) hours as needed. To be taken after surgery 07/12/24   Jule Ronal CROME, PA-C  apixaban  (ELIQUIS ) 2.5 MG TABS tablet Take one tab po bid x 30 days after surgery to prevent blood clots 07/17/24   Jule Ronal CROME, PA-C  Estradiol  10 MCG TABS vaginal tablet Place 1 tablet (10 mcg total) vaginally 2 (two) times a week. 04/19/24   Chrzanowski, Jami B, NP  progesterone  (PROMETRIUM ) 100 MG capsule Take 1 capsule (100 mg total) by mouth daily. 04/19/24   Chrzanowski, Jami B, NP     Positive ROS: All other systems have  been reviewed and were otherwise negative with the exception of those mentioned in the HPI and as above.  Physical Exam: General: Alert, no acute distress Cardiovascular: No pedal edema Respiratory: No cyanosis, no use of accessory musculature GI: abdomen soft Skin: No lesions in the area of chief complaint Neurologic: Sensation intact distally Psychiatric: Patient is competent for consent with normal mood and affect Lymphatic: no lymphedema  MUSCULOSKELETAL: exam stable  Assessment: right knee osteoarthritis  Plan: Plan for Procedures: ARTHROPLASTY, KNEE, TOTAL  The risks benefits and alternatives were discussed with the patient including but not limited to the risks of nonoperative treatment, versus surgical intervention including infection, bleeding, nerve injury,  blood clots, cardiopulmonary complications, morbidity, mortality, among others, and they  were willing to proceed.   Ozell Cummins, MD 07/23/2024 10:05 AM     [1] No Known Allergies  "

## 2024-07-23 NOTE — Discharge Instructions (Signed)

## 2024-07-23 NOTE — Transfer of Care (Signed)
 Immediate Anesthesia Transfer of Care Note  Patient: Sheila Allen  Procedure(s) Performed: ARTHROPLASTY, KNEE, TOTAL, RIGHT (Right: Knee)  Patient Location: PACU  Anesthesia Type:MAC and Spinal  Level of Consciousness: awake, drowsy, patient cooperative, and responds to stimulation  Airway & Oxygen Therapy: Patient Spontanous Breathing and Patient connected to nasal cannula oxygen  Post-op Assessment: Report given to RN and Post -op Vital signs reviewed and stable  Post vital signs: Reviewed and stable  Last Vitals:  Vitals Value Taken Time  BP 113/73 07/23/24 13:57  Temp    Pulse 92 07/23/24 13:58  Resp    SpO2 94 % 07/23/24 13:58  Vitals shown include unfiled device data.  Last Pain:  Vitals:   07/23/24 0932  TempSrc:   PainSc: 2          Complications: No notable events documented.

## 2024-07-23 NOTE — Op Note (Signed)
 "  Total Knee Arthroplasty Procedure Note  Preoperative diagnosis: Right knee osteoarthritis  Postoperative diagnosis:same  Operative findings: Complete loss articular cartilage from patella and medial compartment  Operative procedure: Right total knee arthroplasty. CPT [phone]  Surgeon: N. Ozell Cummins, MD  Assist: Ronal Morna Grave, PA-C; necessary for the timely completion of procedure and due to complexity of procedure.  Anesthesia: Spinal, regional, local  Tourniquet time: see anesthesia record  Implants used: Zimmer persona Femur: CR 8 narrow Tibia: D Patella: 32 mm Polyethylene: 12 mm medial congruent  Indication: Sheila Allen is a 69 y.o. year old female with a history of knee pain. Having failed conservative management, the patient elected to proceed with a total knee arthroplasty.  We have reviewed the risk and benefits of the surgery and they elected to proceed after voicing understanding.  Procedure:  After informed consent was obtained and understanding of the risk were voiced including but not limited to bleeding, infection, damage to surrounding structures including nerves and vessels, blood clots, leg length inequality and the failure to achieve desired results, the operative extremity was marked with verbal confirmation of the patient in the holding area.   The patient was then brought to the operating room and transported to the operating room table in the supine position.  A tourniquet was applied to the operative extremity around the upper thigh. The operative limb was then prepped and draped in the usual sterile fashion and preoperative antibiotics were administered.  A time out was performed prior to the start of surgery confirming the correct extremity, preoperative antibiotic administration, as well as team members, implants and instruments available for the case. Correct surgical site was also confirmed with preoperative radiographs. The limb was then  elevated for exsanguination and the tourniquet was inflated. A midline incision was made and a standard medial parapatellar approach was performed.  The infrapatellar fat pad was removed.  Suprapatellar synovium was removed to reveal the anterior distal femoral cortex.  A medial peel was performed to release the capsule and the deep MCL off of the medial tibial plateau back to the semimembranosus.  The patella was then everted which showed complete loss of articular cartilage and was resected down to 14 mm and sized to a 32 mm.  A cover was placed on the patella for protection from retractors.  The knee was then brought into flexion and we then turned our attention to the femur.  The ACL was sacrificed.  Start site was drilled in the femur and the intramedullary distal femoral cutting guide was placed, set at 5 degrees valgus, taking 10 mm of distal resection. The distal cut was made. Osteophytes were then removed.  Next, the proximal tibial cutting guide was placed with appropriate slope, varus/valgus alignment and depth of resection.  The drop rod was attached to confirm that it was aimed at the second metatarsal.  The proximal tibial cut was made taking 2 mm off the low side. Gap blocks were then used to assess the extension gap and alignment, and appropriate soft tissue releases were performed. Attention was turned back to the femur, which was sized using the sizing guide to a size 8 narrow. Appropriate rotation of the femoral component was determined using epicondylar axis, Whitesides line, and assessing the flexion gap under ligament tension. The appropriate size 4-in-1 cutting block was placed and checked with an angel wing and cuts were made. Posterior femoral osteophytes and uncapped bone were then removed with the curved osteotome.  The menisci  were removed.  Trial components were placed, and stability was checked in full extension, mid-flexion, and deep flexion.  I was happy with the 12 mm poly.  Range  of motion was 0- 125 gravity flexion.  PCL was retained. Proper tibial rotation was determined and marked.  The patella tracked well without a lateral release.  The femoral lugs were then drilled. Trial components were then removed and tibial preparation performed.  The trial tibia was pointed to the medial third of the tibial tubercle.  The tibia was sized for a size D component and prepared.  Trial components were removed.    The bony surfaces were irrigated with a pulse lavage and then dried. Bone cement was vacuum mixed on the back table, and the final components sized above were cemented into place.  Antibiotic irrigation was placed in the knee joint and soft tissues while the cement cured.  After cement had finished curing, excess cement was removed. The stability of the construct was re-evaluated throughout a range of motion and found to be acceptable. The trial liner was removed, the knee was copiously irrigated, and the knee was re-evaluated for any excess bone debris. The real polyethylene liner, 12 mm thick, was inserted and checked to ensure the locking mechanism had engaged appropriately. The tourniquet was deflated and hemostasis was achieved. The wound was irrigated with normal saline.  One gram of vancomycin  powder was placed in the surgical bed.  Topical mixture of 0.25% bupivacaine  and meloxicam  was placed in the joint for postoperative pain.  Capsular closure was performed with a #1 stratafix in flexion, subcutaneous fat closed with a 0 vicryl suture, then subcutaneous tissue closed with interrupted 2.0 vicryl suture. The skin was then closed with a 3.0 monocryl and steri strips. A sterile dressing was applied.  The patient was awakened in the operating room and taken to recovery in stable condition. All sponge, needle, and instrument counts were correct at the end of the case.  Morna Grave was necessary for opening, closing, retracting, limb positioning and overall facilitation and  completion of the surgery.  Position: supine  Complications: none.  Time Out: performed   Drains/Packing: none Estimated blood loss: minimal Returned to Recovery Room: in good condition.   Mechanical VTE (DVT) Prophylaxis: sequential compression devices, TED thigh-high  Chemical VTE (DVT) Prophylaxis: eliquis  POD 1  Fluid Replacement  Crystalloid: see anesthesia record Blood: none  FFP: none   Specimens Removed: 1 to pathology  Sponge and Instrument Count Correct? yes  PACU: portable radiograph - knee AP and Lateral  Plan/RTC: Return in 2 weeks for suture removal.  Weight Bearing/Load Lower Extremity: full   Implant Name Type Inv. Item Serial No. Manufacturer Lot No. LRB No. Used Action  CEMENT BONE REFOBACIN [id number] US  - [id number] Cement CEMENT BONE REFOBACIN [id number] US   ZIMMER RECON(ORTH,TRAU,BIO,SG) [id number] Right 1 Implanted  INSERT TIB ASF CD/8-9 12 RT - [id number] Insert INSERT TIB ASF CD/8-9 12 RT  ZIMMER RECON(ORTH,TRAU,BIO,SG) [id number] Right 1 Implanted  STEM POLY PAT PLY 24M KNEE - [id number] Knees STEM POLY PAT PLY 24M KNEE  ZIMMER RECON(ORTH,TRAU,BIO,SG) [id number] Right 1 Implanted  COMPONENT FEM CMT CR PRS SZ8RT - [id number] Joint COMPONENT FEM CMT CR PRS SZ8RT  ZIMMER RECON(ORTH,TRAU,BIO,SG) [id number] Right 1 Implanted  COMPONENT TIB CMT PS D 0D RT - [id number] Joint COMPONENT TIB CMT PS D 0D RT  ZIMMER RECON(ORTH,TRAU,BIO,SG) [id number] Right 1 Implanted    N. Ozell Cummins, MD [facility] 1:25 PM  "

## 2024-07-23 NOTE — Progress Notes (Signed)
 Orthopedic Tech Progress Note Patient Details:  Sheila Allen 1956-01-26 [id number]  Ortho Devices Type of Ortho Device: Bone foam zero knee Ortho Device/Splint Location: RLE Ortho Device/Splint Interventions: Ordered, Application   Post Interventions Patient Tolerated: Well Instructions Provided: Care of device  Delanna LITTIE Pac 07/23/2024, 2:23 PM

## 2024-07-24 ENCOUNTER — Telehealth (HOSPITAL_COMMUNITY): Payer: Self-pay

## 2024-07-24 ENCOUNTER — Telehealth: Payer: Self-pay

## 2024-07-24 ENCOUNTER — Other Ambulatory Visit (HOSPITAL_COMMUNITY): Payer: Self-pay

## 2024-07-24 ENCOUNTER — Other Ambulatory Visit: Payer: Self-pay | Admitting: Physician Assistant

## 2024-07-24 DIAGNOSIS — M1711 Unilateral primary osteoarthritis, right knee: Secondary | ICD-10-CM | POA: Diagnosis not present

## 2024-07-24 MED ORDER — PROPOFOL 1000 MG/100ML IV EMUL
INTRAVENOUS | Status: AC
  Filled 2024-07-24: qty 200

## 2024-07-24 MED ORDER — PHENYLEPHRINE HCL-NACL 20-0.9 MG/250ML-% IV SOLN
INTRAVENOUS | Status: AC
  Filled 2024-07-24: qty 500

## 2024-07-24 NOTE — Anesthesia Postprocedure Evaluation (Signed)
"   Anesthesia Post Note  Patient: Sheila Allen  Procedure(s) Performed: ARTHROPLASTY, KNEE, TOTAL, RIGHT (Right: Knee)     Patient location during evaluation: PACU Anesthesia Type: MAC, Spinal and Regional Level of consciousness: oriented and awake and alert Pain management: pain level controlled Vital Signs Assessment: post-procedure vital signs reviewed and stable Respiratory status: spontaneous breathing, respiratory function stable and patient connected to nasal cannula oxygen Cardiovascular status: blood pressure returned to baseline and stable Postop Assessment: no headache, no backache and no apparent nausea or vomiting Anesthetic complications: no   No notable events documented.  Last Vitals:  Vitals:   07/24/24 0427 07/24/24 0737  BP: (!) 172/84 137/68  Pulse: 79 79  Resp: 20 16  Temp: 36.6 C   SpO2: 97% 95%    Last Pain:  Vitals:   07/24/24 0945  TempSrc:   PainSc: 3                  [name]      "

## 2024-07-24 NOTE — Progress Notes (Signed)
 Subjective: 1 Day Post-Op Procedures (LRB): ARTHROPLASTY, KNEE, TOTAL, RIGHT (Right) Patient reports pain as mild.    Objective: Vital signs in last 24 hours: Temp:  [97.6 F (36.4 C)-98.8 F (37.1 C)] 97.8 F (36.6 C) (01/20 0427) Pulse Rate:  [62-95] 79 (01/20 0737) Resp:  [9-20] 16 (01/20 0737) BP: (95-172)/(63-111) 137/68 (01/20 0737) SpO2:  [92 %-99 %] 95 % (01/20 0737) Weight:  [85.7 kg] 85.7 kg (01/19 0904)  Intake/Output from previous day: 01/19 0701 - 01/20 0700 In: 2550 [P.O.:1200; I.V.:1350] Out: 850 [Urine:800; Blood:50] Intake/Output this shift: No intake/output data recorded.  No results for input(s): HGB in the last 72 hours. No results for input(s): WBC, RBC, HCT, PLT in the last 72 hours. No results for input(s): NA, K, CL, CO2, BUN, CREATININE, GLUCOSE, CALCIUM  in the last 72 hours. No results for input(s): LABPT, INR in the last 72 hours.  Neurologically intact Neurovascular intact Sensation intact distally Intact pulses distally Dorsiflexion/Plantar flexion intact Incision: scant drainage No cellulitis present Compartment soft   Assessment/Plan: 1 Day Post-Op Procedures (LRB): ARTHROPLASTY, KNEE, TOTAL, RIGHT (Right) Advance diet Up with therapy D/C IV fluids Discharge home with home health once cleared by PT and able to urinate on her own WBAT RLE      Ronal LITTIE Grave 07/24/2024, 8:13 AM

## 2024-07-24 NOTE — Discharge Summary (Signed)
 Patient ID: Sheila Allen MRN: [id number] DOB/AGE: October 06, 1955 69 y.o.  Admit date: 07/23/2024 Discharge date: 07/24/2024  Admission Diagnoses:  Principal Problem:   Primary osteoarthritis of right knee Active Problems:   Status post total right knee replacement   Discharge Diagnoses:  Same  Past Medical History:  Diagnosis Date   Anxiety    Arthritis    Complication of anesthesia    Depression    Disturbance of skin sensation 11/07/2013   Hip pain, left    History of kidney stones    HLD (hyperlipidemia)    Hypertension    PONV (postoperative nausea and vomiting)     Surgeries: Procedures: ARTHROPLASTY, KNEE, TOTAL, RIGHT on 07/23/2024   Consultants:   Discharged Condition: Improved  Hospital Course: Sheila Allen is an 69 y.o. female who was admitted 07/23/2024 for operative treatment ofPrimary osteoarthritis of right knee. Patient has severe unremitting pain that affects sleep, daily activities, and work/hobbies. After pre-op clearance the patient was taken to the operating room on 07/23/2024 and underwent  Procedures: ARTHROPLASTY, KNEE, TOTAL, RIGHT.    Patient was given perioperative antibiotics:  Anti-infectives (From admission, onward)    Start     Dose/Rate Route Frequency Ordered Stop   07/23/24 1800  ceFAZolin  (ANCEF ) IVPB 2g/100 mL premix        2 g 200 mL/hr over 30 Minutes Intravenous Every 6 hours 07/23/24 1533 07/24/24 0047   07/23/24 1242  vancomycin  (VANCOCIN ) powder  Status:  Discontinued          As needed 07/23/24 1245 07/23/24 1355   07/23/24 0930  ceFAZolin  (ANCEF ) IVPB 2g/100 mL premix        2 g 200 mL/hr over 30 Minutes Intravenous On call to O.R. 07/23/24 9070 07/23/24 1211        Patient was given sequential compression devices, early ambulation, and chemoprophylaxis to prevent DVT.  Inpatient Morphine  Milligram Equivalents Per Day 1/19 - 1/20   Values displayed are in units of MME/Day    Order Start / End Date Yesterday Today     oxyCODONE  (Oxy IR/ROXICODONE ) immediate release tablet 5 mg 1/19 - 1/19 0 of Unknown --    oxyCODONE  (ROXICODONE ) 5 MG/5ML solution 5 mg 1/19 - 1/19 0 of Unknown --      Group total: 0 of Unknown     fentaNYL  (SUBLIMAZE ) injection 25-50 mcg 1/19 - 1/19 0 of 45-90 --    oxyCODONE  (Oxy IR/ROXICODONE ) immediate release tablet 5 mg 1/19 - No end date 0 of 15 0 of 30    oxyCODONE  (Oxy IR/ROXICODONE ) immediate release tablet 10 mg 1/19 - No end date 0 of 30 0 of 60    HYDROmorphone  (DILAUDID ) injection 1 mg 1/19 - No end date 0 of 40 0 of 60    fentaNYL  (SUBLIMAZE ) 100 MCG/2ML injection 1/19 - 1/19 0 of Unknown --    fentaNYL  (SUBLIMAZE ) injection 50 mcg 1/19 - 1/19 15 of 15 --    fentaNYL  (SUBLIMAZE ) injection 1/19 - 1/19 *15 of 15 --    Daily Totals  * 30 of Unknown (at least 160-205) 0 of 150  *One-Step medication  Calculation Errors     Order Type Date Details   oxyCODONE  (Oxy IR/ROXICODONE ) immediate release tablet 5 mg Ordered Dose -- Insufficient frequency information   oxyCODONE  (ROXICODONE ) 5 MG/5ML solution 5 mg Ordered Dose -- Insufficient frequency information   fentaNYL  (SUBLIMAZE ) 100 MCG/2ML injection Ordered Dose -- Frequency type could not be determined  Patient benefited maximally from hospital stay and there were no complications.    Recent vital signs: Patient Vitals for the past 24 hrs:  BP Temp Temp src Pulse Resp SpO2 Height Weight  07/24/24 0737 137/68 -- -- 79 16 95 % -- --  07/24/24 0427 (!) 172/84 97.8 F (36.6 C) Oral 79 20 97 % -- --  07/23/24 2355 125/72 98.1 F (36.7 C) Oral 90 18 93 % -- --  07/23/24 1945 132/79 98.6 F (37 C) Oral 94 18 94 % -- --  07/23/24 1652 117/76 98.8 F (37.1 C) -- 77 19 97 % -- --  07/23/24 1630 124/71 97.6 F (36.4 C) -- 69 19 98 % -- --  07/23/24 1615 113/77 -- -- 66 12 95 % -- --  07/23/24 1600 111/75 -- -- 89 17 92 % -- --  07/23/24 1545 110/70 -- -- 75 12 97 % -- --  07/23/24 1530 95/65 -- -- 79 11 97 % --  --  07/23/24 1515 116/63 -- -- 74 14 96 % -- --  07/23/24 1500 112/63 -- -- 69 12 97 % -- --  07/23/24 1445 115/77 -- -- 76 16 98 % -- --  07/23/24 1430 116/74 -- -- 78 15 99 % -- --  07/23/24 1415 109/76 -- -- 78 15 98 % -- --  07/23/24 1400 110/73 98.1 F (36.7 C) -- 91 18 98 % -- --  07/23/24 1035 (!) 162/86 -- -- 75 12 96 % -- --  07/23/24 1030 (!) 170/111 -- -- 95 19 94 % -- --  07/23/24 1025 (!) 166/85 -- -- 73 13 96 % -- --  07/23/24 1020 (!) 161/86 -- -- 62 (!) 9 96 % -- --  07/23/24 1018 (!) 162/87 -- -- 77 14 96 % -- --  07/23/24 0904 (!) 153/81 97.9 F (36.6 C) Oral 73 17 96 % 5' 6 (1.676 m) 85.7 kg     Recent laboratory studies: No results for input(s): WBC, HGB, HCT, PLT, NA, K, CL, CO2, BUN, CREATININE, GLUCOSE, INR, CALCIUM  in the last 72 hours.  Invalid input(s): PT, 2   Discharge Medications:   Allergies as of 07/24/2024   No Known Allergies      Medication List     STOP taking these medications    meloxicam  15 MG tablet Commonly known as: MOBIC    Zepbound  2.5 MG/0.5ML Pen Generic drug: tirzepatide        TAKE these medications    apixaban  2.5 MG Tabs tablet Commonly known as: Eliquis  Take one tab po bid x 30 days after surgery to prevent blood clots   atorvastatin  20 MG tablet Commonly known as: LIPITOR Take 20 mg by mouth daily.   docusate sodium  100 MG capsule Commonly known as: Colace Take 1 capsule (100 mg total) by mouth daily as needed.   DULoxetine  30 MG capsule Commonly known as: CYMBALTA  Take 1 capsule (30 mg total) by mouth 2 (two) times daily.   estradiol  0.1 MG/24HR patch Commonly known as: VIVELLE -DOT Place 1 patch (0.1 mg total) onto the skin 2 (two) times a week.   Estradiol  10 MCG Tabs vaginal tablet Place 1 tablet (10 mcg total) vaginally 2 (two) times a week.   methocarbamol  750 MG tablet Commonly known as: ROBAXIN  Take 1 tablet (750 mg total) by mouth 3 (three) times daily as  needed.   metoprolol  succinate 50 MG 24 hr tablet Commonly known as: TOPROL -XL Take 50 mg by mouth daily. Take  with or immediately following a meal.   ondansetron  4 MG tablet Commonly known as: Zofran  Take 1 tablet (4 mg total) by mouth every 8 (eight) hours as needed for nausea or vomiting.   oxyCODONE -acetaminophen  5-325 MG tablet Commonly known as: Percocet Take 1-2 tablets by mouth every 6 (six) hours as needed. To be taken after surgery   progesterone  100 MG capsule Commonly known as: PROMETRIUM  Take 1 capsule (100 mg total) by mouth daily.               Durable Medical Equipment  (From admission, onward)           Start     Ordered   07/23/24 1534  DME Walker rolling  Once       Question Answer Comment  Walker: With 5 Inch Wheels   Patient needs a walker to treat with the following condition Status post left partial knee replacement      07/23/24 1533   07/23/24 1534  DME 3 n 1  Once        07/23/24 1533   07/23/24 1534  DME Bedside commode  Once       Question:  Patient needs a bedside commode to treat with the following condition  Answer:  Status post left partial knee replacement   07/23/24 1533            Diagnostic Studies: DG Knee Right Port Result Date: 07/23/2024 CLINICAL DATA:  Postop. EXAM: DG KNEE 1-2V PORT*R* COMPARISON:  Preoperative imaging FINDINGS: Right knee arthroplasty in expected alignment. No periprosthetic lucency or fracture. There has been patellar resurfacing. Recent postsurgical change includes air and edema in the soft tissues and joint space. IMPRESSION: Right knee arthroplasty without immediate postoperative complication. Electronically Signed   By: Andrea Gasman M.D.   On: 07/23/2024 17:20    Disposition: Discharge disposition: 01-Home or Self Care          Follow-up Information     Jule Ronal CROME, PA-C. Schedule an appointment as soon as possible for a visit in 2 week(s).   Specialty: Orthopedic  Surgery Contact information: [address] [place]  [place] [address] [phone]         [facility] [place] [facility]) Follow up.   Specialty: Home Health Services Why: [facility] will contact you for the first home visit. Contact information: [address] [place]  [address] [phone]                 Signed: Ronal CROME Jule 07/24/2024, 8:14 AM

## 2024-07-24 NOTE — Telephone Encounter (Signed)
 Leeroy (sister) calling stating they were told she would get a CPM, and she did not?  [phone]

## 2024-07-24 NOTE — Telephone Encounter (Signed)
 They called us  from the hospital about this and lori looked into it and sent the cpm folks a message.

## 2024-07-24 NOTE — Progress Notes (Signed)
 Physical Therapy Treatment Patient Details Name: Sheila Allen MRN: [id number] DOB: [birth year] Today's Date: 07/24/2024   History of Present Illness Pt is 69 year old presented to [facility] on  07/23/24 for rt TKR. PMH - htn, anxiety, depression, arthritis    PT Comments  Pt admitted with above diagnosis. Pt was able to ambulate incr distance and all education completed with pt to include bed mobility, transfers, gait, stairs and exercises. Husband present and educated in assisting pt as well. Pt issued gait belt and will get f/u per MD.  Of note, nurse made aware that pt with BP drop.  Pt was somewhat diaphoretic end of walk and BP sitting 102/53 with HR 72 bpm and standing 91/55 with HR 90 bpm. Supposed to go home today.   Pt currently with functional limitations due to the deficits listed below (see PT Problem List). Pt will benefit from acute skilled PT to increase their independence and safety with mobility to allow discharge.       If plan is discharge home, recommend the following: A little help with walking and/or transfers;A little help with bathing/dressing/bathroom;Assistance with cooking/housework;Assist for transportation;Help with stairs or ramp for entrance   Can travel by private vehicle        Equipment Recommendations  Rolling walker (2 wheels);BSC/3in1    Recommendations for Other Services       Precautions / Restrictions Precautions Precautions: Knee Restrictions Weight Bearing Restrictions Per Provider Order: Yes RLE Weight Bearing Per Provider Order: Weight bearing as tolerated     Mobility  Bed Mobility   Bed Mobility: Supine to Sit     Supine to sit: Supervision     General bed mobility comments: No assist needed    Transfers Overall transfer level: Needs assistance Equipment used: Rolling walker (2 wheels) Transfers: Sit to/from Stand Sit to Stand: Supervision           General transfer comment: cues on sequencing on standing and for hand  placement    Ambulation/Gait Ambulation/Gait assistance: Min assist Gait Distance (Feet): 200 Feet Assistive device: Rolling walker (2 wheels) Gait Pattern/deviations: Step-to pattern, Decreased step length - right, Decreased stance time - right, Decreased weight shift to right, Antalgic   Gait velocity interpretation: 1.31 - 2.62 ft/sec, indicative of limited community ambulator   General Gait Details: Pt was able to progress ambulation with RW.  Had 2 episodes of right knee buckling a little and needing steayding assist. Had husband guard pt to get practice for home. Husband assists pt appropriately.   Stairs Stairs: Yes Stairs assistance: Min assist Stair Management: One rail Right, Step to pattern, Sideways, With walker, Backwards Number of Stairs: 4 General stair comments: Pt plans to ascend and descend using right rail and demonstrated with min assist as pt had one episode of right knee buckling.  Husband understands how to assist pt on stairs.  Also demonstrated how to go up and down with RW backwards.   Wheelchair Mobility     Tilt Bed    Modified Rankin (Stroke Patients Only)       Balance Overall balance assessment: Needs assistance Sitting-balance support: Feet supported Sitting balance-Leahy Scale: Good     Standing balance support: Bilateral upper extremity supported Standing balance-Leahy Scale: Fair Standing balance comment: relaint on RW for ambulation                            Communication Communication Communication: No apparent difficulties  Cognition Arousal: Alert Behavior During Therapy: WFL for tasks assessed/performed   PT - Cognitive impairments: No apparent impairments                         Following commands: Intact      Cueing Cueing Techniques: Verbal cues  Exercises Total Joint Exercises Ankle Circles/Pumps: AROM, Both, Supine, 10 reps Quad Sets: AROM, Right, Supine, 10 reps Towel Squeeze: AROM, Both,  10 reps, Supine Short Arc Quad: AROM, Both, 10 reps, Supine Heel Slides: Right, Supine, AROM, 10 reps Hip ABduction/ADduction: Right, Supine, 10 reps, AROM Straight Leg Raises: Right, Supine, AROM, 10 reps Long Arc Quad: Right, Seated, AROM, 10 reps Knee Flexion: Right, Seated, AROM, 10 reps Goniometric ROM: 0-98    General Comments General comments (skin integrity, edema, etc.): Discussed car transfer with pt and husband.      Pertinent Vitals/Pain Pain Assessment Pain Assessment: Faces Faces Pain Scale: Hurts a little bit Pain Location: sx site Pain Descriptors / Indicators: Discomfort Pain Intervention(s): Limited activity within patient's tolerance, Monitored during session, Repositioned, Premedicated before session    Home Living                          Prior Function            PT Goals (current goals can now be found in the care plan section) Acute Rehab PT Goals Patient Stated Goal: return home Progress towards PT goals: Progressing toward goals    Frequency    7X/week      PT Plan      Co-evaluation              AM-PAC PT 6 Clicks Mobility   Outcome Measure  Help needed turning from your back to your side while in a flat bed without using bedrails?: A Little Help needed moving from lying on your back to sitting on the side of a flat bed without using bedrails?: A Little Help needed moving to and from a bed to a chair (including a wheelchair)?: A Little Help needed standing up from a chair using your arms (e.g., wheelchair or bedside chair)?: A Little Help needed to walk in hospital room?: A Little Help needed climbing 3-5 steps with a railing? : A Little 6 Click Score: 18    End of Session Equipment Utilized During Treatment: Gait belt Activity Tolerance: Patient tolerated treatment well Patient left: with call bell/phone within reach;in bed;with family/visitor present Nurse Communication: Mobility status (BP dropped after  walk/stairs) PT Visit Diagnosis: Other abnormalities of gait and mobility (R26.89);Difficulty in walking, not elsewhere classified (R26.2)     Time: 9069-8977 PT Time Calculation (min) (ACUTE ONLY): 52 min  Charges:    $Gait Training: 23-37 mins $Therapeutic Exercise: 8-22 mins PT General Charges $$ ACUTE PT VISIT: 1 Visit                     Cire Clute M,PT Acute Rehab Services [phone]    Stephane JULIANNA Bevel 07/24/2024, 12:09 PM

## 2024-07-24 NOTE — Telephone Encounter (Signed)
 Pharmacy Patient Advocate Encounter  Insurance verification completed.    The patient is insured through [employer]. Patient has Medicare and is not eligible for a copay card, but may be able to apply for patient assistance or Medicare RX Payment Plan (Patient Must reach out to their plan, if eligible for payment plan), if available.    Ran test claim for Eliquis  5mg  tablet and the current 30 day co-pay is $45.   This test claim was processed through [employer]- copay amounts may vary at other pharmacies due to [employer], or as the patient moves through the different stages of their insurance plan.

## 2024-07-24 NOTE — Plan of Care (Signed)
" °  Problem: Education: Goal: Knowledge of General Education information will improve Description: Including pain rating scale, medication(s)/side effects and non-pharmacologic comfort measures Outcome: Completed/Met   Problem: Health Behavior/Discharge Planning: Goal: Ability to manage health-related needs will improve Outcome: Completed/Met   Problem: Clinical Measurements: Goal: Ability to maintain clinical measurements within normal limits will improve Outcome: Completed/Met Goal: Will remain free from infection Outcome: Completed/Met Goal: Diagnostic test results will improve Outcome: Completed/Met Goal: Respiratory complications will improve Outcome: Completed/Met Goal: Cardiovascular complication will be avoided Outcome: Completed/Met   Problem: Activity: Goal: Risk for activity intolerance will decrease Outcome: Completed/Met   Problem: Nutrition: Goal: Adequate nutrition will be maintained Outcome: Completed/Met   Problem: Coping: Goal: Level of anxiety will decrease Outcome: Completed/Met   Problem: Elimination: Goal: Will not experience complications related to bowel motility Outcome: Completed/Met Goal: Will not experience complications related to urinary retention Outcome: Completed/Met   Problem: Pain Managment: Goal: General experience of comfort will improve and/or be controlled Outcome: Completed/Met   Problem: Safety: Goal: Ability to remain free from injury will improve Outcome: Completed/Met   Problem: Skin Integrity: Goal: Risk for impaired skin integrity will decrease Outcome: Completed/Met   Problem: Education: Goal: Knowledge of the prescribed therapeutic regimen will improve Outcome: Completed/Met   Problem: Bowel/Gastric: Goal: Gastrointestinal status for postoperative course will improve Outcome: Completed/Met   Problem: Cardiac: Goal: Ability to maintain an adequate cardiac output Outcome: Completed/Met Goal: Will show no  evidence of cardiac arrhythmias Outcome: Completed/Met   Problem: Nutritional: Goal: Will attain and maintain optimal nutritional status Outcome: Completed/Met   Problem: Neurological: Goal: Will regain or maintain usual level of consciousness Outcome: Completed/Met   Problem: Clinical Measurements: Goal: Ability to maintain clinical measurements within normal limits Outcome: Completed/Met Goal: Postoperative complications will be avoided or minimized Outcome: Completed/Met   Problem: Respiratory: Goal: Will regain and/or maintain adequate ventilation Outcome: Completed/Met Goal: Respiratory status will improve Outcome: Completed/Met   Problem: Skin Integrity: Goal: Demonstrates signs of wound healing without infection Outcome: Completed/Met   Problem: Urinary Elimination: Goal: Will remain free from infection Outcome: Completed/Met Goal: Ability to achieve and maintain adequate urine output Outcome: Completed/Met   Problem: Education: Goal: Knowledge of the prescribed therapeutic regimen will improve Outcome: Completed/Met Goal: Individualized Educational Video(s) Outcome: Completed/Met   Problem: Activity: Goal: Ability to avoid complications of mobility impairment will improve Outcome: Completed/Met Goal: Range of joint motion will improve Outcome: Completed/Met   Problem: Clinical Measurements: Goal: Postoperative complications will be avoided or minimized Outcome: Completed/Met   Problem: Pain Management: Goal: Pain level will decrease with appropriate interventions Outcome: Completed/Met   Problem: Skin Integrity: Goal: Will show signs of wound healing Outcome: Completed/Met   "

## 2024-07-24 NOTE — Evaluation (Signed)
 Occupational Therapy Evaluation Patient Details Name: Sheila Allen MRN: [id number] DOB: [birth year] Today's Date: 07/24/2024   History of Present Illness   Pt is 69 year old presented to [facility] on  07/23/24 for rt TKR. PMH - htn, anxiety, depression, arthritis     Clinical Impressions Pt reported at PLOF lives with her husband in a 4STE house with 2 levels but can live on the main level temporary if needed but only has half bath. She reported her husband can assist as needed. She was able to complete toileting tasks and UE/LE dressing with supervision to CGA. At this time educated on DME/AE with the return to home. Pt voiced an understanding.      If plan is discharge home, recommend the following:   Help with stairs or ramp for entrance;Assistance with cooking/housework;A little help with bathing/dressing/bathroom;A little help with walking and/or transfers     Functional Status Assessment   Patient has had a recent decline in their functional status and demonstrates the ability to make significant improvements in function in a reasonable and predictable amount of time.     Equipment Recommendations   BSC/3in1 (RW)     Recommendations for Other Services         Precautions/Restrictions   Precautions Precautions: Knee Restrictions Weight Bearing Restrictions Per Provider Order: Yes RLE Weight Bearing Per Provider Order: Weight bearing as tolerated     Mobility Bed Mobility Overal bed mobility: Independent             General bed mobility comments: bed placed completl in supine position    Transfers Overall transfer level: Needs assistance Equipment used: Rolling walker (2 wheels) Transfers: Sit to/from Stand Sit to Stand: Supervision           General transfer comment: cues on sequencing on standing      Balance Overall balance assessment: Needs assistance Sitting-balance support: Feet supported Sitting balance-Leahy Scale: Good      Standing balance support: Bilateral upper extremity supported Standing balance-Leahy Scale: Fair Standing balance comment: relaint on RW for ambulation                           ADL either performed or assessed with clinical judgement   ADL Overall ADL's : Needs assistance/impaired Eating/Feeding: Independent;Sitting   Grooming: Wash/dry hands;Modified independent;Standing   Upper Body Bathing: Modified independent;Standing   Lower Body Bathing: Supervison/ safety;Sit to/from stand   Upper Body Dressing : Modified independent;Standing   Lower Body Dressing: Supervision/safety;Sit to/from stand   Toilet Transfer: Supervision/safety;Cueing for safety;Cueing for sequencing;Rolling walker (2 wheels)   Toileting- Clothing Manipulation and Hygiene: Supervision/safety;Sit to/from stand       Functional mobility during ADLs: Supervision/safety;Cueing for safety;Cueing for sequencing;Rolling walker (2 wheels)       Vision Baseline Vision/History: 0 No visual deficits Ability to See in Adequate Light: 0 Adequate Patient Visual Report: No change from baseline Vision Assessment?: No apparent visual deficits     Perception Perception: Within Functional Limits       Praxis Praxis: WFL       Pertinent Vitals/Pain Pain Assessment Pain Assessment: 0-10 Pain Score: 1  Pain Location: sx site Pain Descriptors / Indicators: Discomfort     Extremity/Trunk Assessment Upper Extremity Assessment Upper Extremity Assessment: Overall WFL for tasks assessed   Lower Extremity Assessment Lower Extremity Assessment: Defer to PT evaluation   Cervical / Trunk Assessment Cervical / Trunk Assessment: Normal   Communication Communication Communication:  No apparent difficulties   Cognition Arousal: Alert Behavior During Therapy: WFL for tasks assessed/performed Cognition: No apparent impairments                               Following commands: Intact        Cueing  General Comments   Cueing Techniques: Verbal cues      Exercises     Shoulder Instructions      Home Living Family/patient expects to be discharged to:: Private residence Living Arrangements: Spouse/significant other Available Help at Discharge: Family;Available 24 hours/day Type of Home: House Home Access: Stairs to enter [employer] of Steps: 4 Entrance Stairs-Rails: Right;Left Home Layout: Two level;Bed/bath upstairs     Bathroom Shower/Tub: Producer, Television/film/video: Standard     Home Equipment: None          Prior Functioning/Environment Prior Level of Function : Independent/Modified Independent;Driving             Mobility Comments: No assistive device ADLs Comments: indep    OT Problem List: Decreased strength;Decreased activity tolerance;Impaired balance (sitting and/or standing);Pain;Decreased knowledge of use of DME or AE   OT Treatment/Interventions: Self-care/ADL training;Therapeutic exercise;DME and/or AE instruction;Therapeutic activities;Patient/family education;Balance training      OT Goals(Current goals can be found in the care plan section)   Acute Rehab OT Goals Patient Stated Goal: to go home OT Goal Formulation: With patient Time For Goal Achievement: 08/07/24 Potential to Achieve Goals: Good   OT Frequency:  Min 2X/week    Co-evaluation              AM-PAC OT 6 Clicks Daily Activity     Outcome Measure Help from another person eating meals?: None Help from another person taking care of personal grooming?: None Help from another person toileting, which includes using toliet, bedpan, or urinal?: A Little Help from another person bathing (including washing, rinsing, drying)?: A Little Help from another person to put on and taking off regular upper body clothing?: None Help from another person to put on and taking off regular lower body clothing?: A Little 6 Click Score: 21   End of Session  Equipment Utilized During Treatment: Gait belt;Rolling walker (2 wheels) Nurse Communication: Mobility status  Activity Tolerance: Patient tolerated treatment well Patient left: in chair;with call bell/phone within reach  OT Visit Diagnosis: Unsteadiness on feet (R26.81);Other abnormalities of gait and mobility (R26.89);Muscle weakness (generalized) (M62.81);Pain Pain - Right/Left: Right Pain - part of body: Knee                Time: 9264-9185 OT Time Calculation (min): 39 min Charges:  OT General Charges $OT Visit: 1 Visit OT Evaluation $OT Eval Low Complexity: 1 Low OT Treatments $Self Care/Home Management : 8-22 mins  Warrick POUR OTR/L  Acute Rehab Services  [phone] office number    Warrick Berber 07/24/2024, 8:25 AM

## 2024-07-24 NOTE — Telephone Encounter (Signed)
 Patient aware

## 2024-07-24 NOTE — Progress Notes (Signed)
 Patient alert and oriented, mae's well, voiding adequate amount of urine, swallowing without difficulty, no c/o pain at time of discharge. Patient discharged home with family. Script and discharged instructions given to patient. Patient and family stated understanding of instructions given. Room was checked and accounted for all patient's belongings; discharge instructions concerning his medications, incision care, follow up appointment and when to call the doctor as needed were all discussed with patient by RN and she expressed understanding on the instructions given

## 2024-07-27 ENCOUNTER — Encounter (HOSPITAL_COMMUNITY): Payer: Self-pay | Admitting: Orthopaedic Surgery

## 2024-07-31 ENCOUNTER — Telehealth: Payer: Self-pay

## 2024-07-31 ENCOUNTER — Other Ambulatory Visit: Payer: Self-pay | Admitting: Physician Assistant

## 2024-07-31 MED ORDER — OXYCODONE-ACETAMINOPHEN 5-325 MG PO TABS: 1.0000 | ORAL_TABLET | Freq: Three times a day (TID) | ORAL | 0 refills | Status: AC | PRN

## 2024-07-31 NOTE — Telephone Encounter (Signed)
 sent

## 2024-07-31 NOTE — Telephone Encounter (Signed)
Talked with patient and advised her that Rx was sent to her pharmacy.

## 2024-07-31 NOTE — Telephone Encounter (Signed)
 Patient would like a Rx refill on Oxycodone .  Please send to [facility].  CB# [phone].  Please advise.  Thank you.

## 2024-08-03 ENCOUNTER — Other Ambulatory Visit: Payer: Self-pay | Admitting: Physician Assistant

## 2024-08-03 ENCOUNTER — Telehealth: Payer: Self-pay | Admitting: Radiology

## 2024-08-03 MED ORDER — HYDROCODONE-ACETAMINOPHEN 5-325 MG PO TABS: 1.0000 | ORAL_TABLET | Freq: Four times a day (QID) | ORAL | 0 refills | Status: AC | PRN

## 2024-08-03 NOTE — Telephone Encounter (Signed)
 Patient had total knee by Dr. Jerri on Mon. 1/19.   States oxycodone  is making her break out and itch. Requesting different pain medication.  Call back # [phone]

## 2024-08-03 NOTE — Telephone Encounter (Signed)
 sent

## 2024-08-06 ENCOUNTER — Other Ambulatory Visit: Payer: Self-pay | Admitting: Podiatry

## 2024-08-07 ENCOUNTER — Ambulatory Visit: Admitting: Physician Assistant

## 2024-08-07 DIAGNOSIS — Z96651 Presence of right artificial knee joint: Secondary | ICD-10-CM

## 2024-08-08 ENCOUNTER — Telehealth: Payer: Self-pay | Admitting: Physician Assistant

## 2024-08-08 NOTE — Telephone Encounter (Signed)
 Would recommend trying a melatonin tonight.  If that does not work, can try a benadryl  the next night.  If still not helping, would recommend reaching out to pcp for prescription sleep med as we do not write those

## 2024-08-08 NOTE — Telephone Encounter (Signed)
 Pt states she is not sleeping and wanted to know if she could have something to help her sleep

## 2024-08-08 NOTE — Therapy (Incomplete)
 " OUTPATIENT PHYSICAL THERAPY LOWER EXTREMITY EVALUATION   Patient Name: TURKESSA OSTROM MRN: [id number] DOB:May 28, 1956, 69 y.o., female Today's Date: 08/08/2024  END OF SESSION:   Past Medical History:  Diagnosis Date   Anxiety    Arthritis    Complication of anesthesia    Depression    Disturbance of skin sensation 11/07/2013   Hip pain, left    History of kidney stones    HLD (hyperlipidemia)    Hypertension    PONV (postoperative nausea and vomiting)    Past Surgical History:  Procedure Laterality Date   ABDOMINAL HYSTERECTOMY     CHOLECYSTECTOMY  12/2017   CYSTOSCOPY WITH RETROGRADE PYELOGRAM, URETEROSCOPY AND STENT PLACEMENT Left 01/07/2023   Procedure: CYSTOSCOPY WITH LEFT  RETROGRADE PYELOGRAM, URETEROSCOPY AND STENT PLACEMENT;  Surgeon: Nieves Cough, MD;  Location: [facility];  Service: Urology;  Laterality: Left;  60 MINS FOR CASE   EXTRACORPOREAL SHOCK WAVE LITHOTRIPSY Right 01/02/2018   Procedure: RIGHT EXTRACORPOREAL SHOCK WAVE LITHOTRIPSY (ESWL);  Surgeon: Matilda Senior, MD;  Location: [facility];  Service: Urology;  Laterality: Right;   EXTRACORPOREAL SHOCK WAVE LITHOTRIPSY Left 07/20/2018   Procedure: EXTRACORPOREAL SHOCK WAVE LITHOTRIPSY (ESWL);  Surgeon: Watt Rush, MD;  Location: [facility];  Service: Urology;  Laterality: Left;   EXTRACORPOREAL SHOCK WAVE LITHOTRIPSY Left 01/10/2020   Procedure: EXTRACORPOREAL SHOCK WAVE LITHOTRIPSY (ESWL);  Surgeon: Alvaro Hummer, MD;  Location: [facility];  Service: Urology;  Laterality: Left;  NEEDS 75 MIN   EXTRACORPOREAL SHOCK WAVE LITHOTRIPSY Right 02/07/2020   Procedure: EXTRACORPOREAL SHOCK WAVE LITHOTRIPSY (ESWL);  Surgeon: Watt Rush, MD;  Location: [facility];  Service: Urology;  Laterality: Right;   EXTRACORPOREAL SHOCK WAVE LITHOTRIPSY Left 12/13/2022   Procedure: LEFT EXTRACORPOREAL SHOCK WAVE LITHOTRIPSY (ESWL);  Surgeon: Matilda Senior, MD;  Location: [facility];   Service: Urology;  Laterality: Left;  75 MINUTES NEEDED FOR CASE   HIP SURGERY Left    bursectomy   HOLMIUM LASER APPLICATION Left 01/07/2023   Procedure: HOLMIUM LASER APPLICATION;  Surgeon: Nieves Cough, MD;  Location: [facility];  Service: Urology;  Laterality: Left;   OVARIAN CYST REMOVAL     TONSILLECTOMY     TOTAL KNEE ARTHROPLASTY Right 07/23/2024   Procedure: ARTHROPLASTY, KNEE, TOTAL, RIGHT;  Surgeon: Jerri Kay HERO, MD;  Location: [facility];  Service: Orthopedics;  Laterality: Right;   Patient Active Problem List   Diagnosis Date Noted   Status post total right knee replacement 07/23/2024   Primary osteoarthritis of right knee 06/06/2024   Disturbance of skin sensation 11/07/2013   Chest pain 07/07/2013   Hypokalemia 07/07/2013   Hypertension 07/07/2013   Chest pain at rest 07/07/2013   Bursitis of left hip 07/20/2011    PCP: Dayna Motto, DO   REFERRING PROVIDER: Jule Ronal CROME, PA-C   REFERRING DIAG: [phone] (ICD-10-CM) - Status post total right knee replacement   THERAPY DIAG:  No diagnosis found.  Rationale for Evaluation and Treatment: Rehabilitation  ONSET DATE: ***  SUBJECTIVE:   SUBJECTIVE STATEMENT: ***  PERTINENT HISTORY: *** PAIN:  Are you having pain? Yes: NPRS scale: *** Pain location: *** Pain description: *** Aggravating factors: *** Relieving factors: ***  PRECAUTIONS: {Therapy precautions:24002}  RED FLAGS: {PT Red Flags:29287}   WEIGHT BEARING RESTRICTIONS: {Yes ***/No:24003}  FALLS:  Has patient fallen in last 6 months? {fallsyesno:27318}  LIVING ENVIRONMENT: Lives with: {[facility] lives with:25569::lives with their family} Lives in: {Lives in:25570} Stairs: {opstairs:27293} Has following equipment at home: {  Assistive devices:23999}  OCCUPATION: ***  PLOF: {PLOF:24004}  PATIENT GOALS: ***  NEXT MD VISIT: ***  OBJECTIVE:  Note: Objective measures were completed at Evaluation unless otherwise noted.  DIAGNOSTIC FINDINGS:  06/06/24 X-rays demonstrate severe tricompartmental osteoarthritis. Bone-on-bone joint space narrowing of medial compartment. Kellgren-Lawrence stage IV   PATIENT SURVEYS:  PSFS: THE PATIENT SPECIFIC FUNCTIONAL SCALE  Place score of 0-10 (0 = unable to perform activity and 10 = able to perform activity at the same level as before injury or problem)  Activity Date: 08/09/24         2.     3.     4.      Total Score ***      Total Score = Sum of activity scores/number of activities  Minimally Detectable Change: 3 points (for single activity); 2 points (for average score)  [facility] (nd). The Patient Specific Functional Scale . Retrieved from [email or web address]   COGNITION: Overall cognitive status: Within functional limits for tasks assessed     SENSATION: {sensation:27233}  EDEMA:  {edema:24020}  MUSCLE LENGTH: Hamstrings: Right *** deg; Left *** deg Debby test: Right *** deg; Left *** deg  POSTURE: {posture:25561}  PALPATION: ***  LOWER EXTREMITY ROM:  Active/Passive ROM Right eval Left eval  Hip flexion    Hip extension    Hip abduction    Hip adduction    Hip internal rotation    Hip external rotation    Knee flexion ***   Knee extension ***   Ankle dorsiflexion    Ankle plantarflexion    Ankle inversion    Ankle eversion     (Blank rows = not tested)  LOWER EXTREMITY MMT:  MMT Right eval Left eval  Hip flexion ***   Hip extension    Hip abduction    Hip adduction    Hip internal rotation    Hip external rotation    Knee flexion ***   Knee extension ***   Ankle dorsiflexion    Ankle plantarflexion    Ankle inversion    Ankle eversion     (Blank rows = not tested)  LOWER EXTREMITY SPECIAL TESTS:  Post surgical- no special tests  FUNCTIONAL TESTS:  5 times sit to stand: ***  GAIT: Distance walked: *** Assistive device utilized: {Assistive devices:23999} Level of  assistance: {Levels of assistance:24026} Comments: ***                                                                                                                                TREATMENT DATE: 08/09/24  Initial evaluation completed of right knee followed by its initial evaluation completed of right knee followed by instruction and trial set of HEP.   PATIENT EDUCATION:  Education details: HEP Person educated: Patient Education method: Programmer, Multimedia, Facilities Manager, Actor cues, Verbal cues, and Handouts Education comprehension: verbalized understanding, returned demonstration, and verbal cues required  HOME EXERCISE PROGRAM: ***  ASSESSMENT:  CLINICAL IMPRESSION: Patient is a 69y.o. female who was seen today for physical therapy evaluation and treatment for status post right TKA.  She presents with decreased range of motion, decreased strength, decreased flexibility, altered gait and pain.  Patient would benefit from skilled physical therapy services to address these deficits and return to previous level of activity.  OBJECTIVE IMPAIRMENTS: {opptimpairments:25111}.   ACTIVITY LIMITATIONS: {activitylimitations:27494}  PARTICIPATION LIMITATIONS: {participationrestrictions:25113}  PERSONAL FACTORS: {Personal factors:25162} are also affecting patient's functional outcome.   REHAB POTENTIAL: {rehabpotential:25112}  CLINICAL DECISION MAKING: {clinical decision making:25114}  EVALUATION COMPLEXITY: {Evaluation complexity:25115}   GOALS: Goals reviewed with patient? Yes  SHORT TERM GOALS: Target date: *** Patient to be independent with HEP. Baseline: Goal status: INITIAL  2.  Decrease pain by 1 level. Baseline:  Goal status: INITIAL  3.  *** Baseline:  Goal status: INITIAL  4.  *** Baseline:  Goal status: INITIAL  5.  *** Baseline:  Goal status: INITIAL  6.  *** Baseline:  Goal status: INITIAL  LONG TERM GOALS: Target date: ***  Patient to be independent  with self progressive HEP at discharge. Baseline:  Goal status: INITIAL  2.  Reduce max pain to 2 out of 10 with all activities. Baseline:  Goal status: INITIAL  3.  Increase active range of motion to at least  0> 110.  Baseline:  Goal status: INITIAL  4.  Increase strength of right lower extremity by half a grade to 1 full grade where deficits are present. Baseline:  Goal status: INITIAL  5.  Patient able to ambulate short community distances without assistive device. Baseline:  Goal status: INITIAL  6.  Improve score of PSFS by 3 points or more to show measurable improvement. Baseline:  Goal status: INITIAL   PLAN:  PT FREQUENCY: {rehab frequency:25116}  PT DURATION: {rehab duration:25117}  PLANNED INTERVENTIONS: 97164- PT Re-evaluation, 97750- Physical Performance Testing, 97110-Therapeutic exercises, 97530- Therapeutic activity, 97112- Neuromuscular re-education, 97535- Self Care, 02859- Manual therapy, [phone]- Gait training, [phone]- Aquatic Therapy, [phone]- Electrical stimulation (unattended), 97016- Vasopneumatic device, 20560 (1-2 muscles), 20561 (3+ muscles)- Dry Needling, Patient/Family education, Balance training, Stair training, Joint mobilization, Scar mobilization, Cryotherapy, and Moist heat  PLAN FOR NEXT SESSION: ***   Burnard CHRISTELLA Meth, PT 08/08/2024, 4:08 PM  "

## 2024-08-08 NOTE — Telephone Encounter (Signed)
 Called patient. No answer. LMOM with all details.

## 2024-08-09 ENCOUNTER — Ambulatory Visit: Admitting: Physical Therapy

## 2024-08-09 NOTE — Therapy (Incomplete)
 " OUTPATIENT PHYSICAL THERAPY LOWER EXTREMITY EVALUATION   Patient Name: Sheila Allen MRN: [id number] DOB:[birth year], 69 y.o., female Today's Date: 08/09/2024  END OF SESSION:   Past Medical History:  Diagnosis Date   Anxiety    Arthritis    Complication of anesthesia    Depression    Disturbance of skin sensation 11/07/2013   Hip pain, left    History of kidney stones    HLD (hyperlipidemia)    Hypertension    PONV (postoperative nausea and vomiting)    Past Surgical History:  Procedure Laterality Date   ABDOMINAL HYSTERECTOMY     CHOLECYSTECTOMY  12/2017   CYSTOSCOPY WITH RETROGRADE PYELOGRAM, URETEROSCOPY AND STENT PLACEMENT Left 01/07/2023   Procedure: CYSTOSCOPY WITH LEFT  RETROGRADE PYELOGRAM, URETEROSCOPY AND STENT PLACEMENT;  Surgeon: Nieves Cough, MD;  Location: [facility];  Service: Urology;  Laterality: Left;  60 MINS FOR CASE   EXTRACORPOREAL SHOCK WAVE LITHOTRIPSY Right 01/02/2018   Procedure: RIGHT EXTRACORPOREAL SHOCK WAVE LITHOTRIPSY (ESWL);  Surgeon: Matilda Senior, MD;  Location: [facility];  Service: Urology;  Laterality: Right;   EXTRACORPOREAL SHOCK WAVE LITHOTRIPSY Left 07/20/2018   Procedure: EXTRACORPOREAL SHOCK WAVE LITHOTRIPSY (ESWL);  Surgeon: Watt Rush, MD;  Location: [facility];  Service: Urology;  Laterality: Left;   EXTRACORPOREAL SHOCK WAVE LITHOTRIPSY Left 01/10/2020   Procedure: EXTRACORPOREAL SHOCK WAVE LITHOTRIPSY (ESWL);  Surgeon: Alvaro Hummer, MD;  Location: [facility];  Service: Urology;  Laterality: Left;  NEEDS 75 MIN   EXTRACORPOREAL SHOCK WAVE LITHOTRIPSY Right 02/07/2020   Procedure: EXTRACORPOREAL SHOCK WAVE LITHOTRIPSY (ESWL);  Surgeon: Watt Rush, MD;  Location: [facility];  Service: Urology;  Laterality: Right;   EXTRACORPOREAL SHOCK WAVE LITHOTRIPSY Left 12/13/2022   Procedure: LEFT EXTRACORPOREAL SHOCK WAVE LITHOTRIPSY (ESWL);  Surgeon: Matilda Senior, MD;  Location: [facility];   Service: Urology;  Laterality: Left;  75 MINUTES NEEDED FOR CASE   HIP SURGERY Left    bursectomy   HOLMIUM LASER APPLICATION Left 01/07/2023   Procedure: HOLMIUM LASER APPLICATION;  Surgeon: Nieves Cough, MD;  Location: [facility];  Service: Urology;  Laterality: Left;   OVARIAN CYST REMOVAL     TONSILLECTOMY     TOTAL KNEE ARTHROPLASTY Right 07/23/2024   Procedure: ARTHROPLASTY, KNEE, TOTAL, RIGHT;  Surgeon: Jerri Kay HERO, MD;  Location: [facility];  Service: Orthopedics;  Laterality: Right;   Patient Active Problem List   Diagnosis Date Noted   Status post total right knee replacement 07/23/2024   Primary osteoarthritis of right knee 06/06/2024   Disturbance of skin sensation 11/07/2013   Chest pain 07/07/2013   Hypokalemia 07/07/2013   Hypertension 07/07/2013   Chest pain at rest 07/07/2013   Bursitis of left hip 07/20/2011    PCP: Dayna Motto, DO  REFERRING PROVIDER: Jule Ronal CROME, PA-C  REFERRING DIAG: [phone] (ICD-10-CM) - Status post total right knee replacement   THERAPY DIAG:  No diagnosis found.  Rationale for Evaluation and Treatment: Rehabilitation  ONSET DATE: 07/23/24 Surgery L TKA  SUBJECTIVE:   SUBJECTIVE STATEMENT: Patient underwent a Rt TKA 07/23/24 with no  complications secondary to primary OA of the Rt. Knee.   PERTINENT HISTORY: L TKA 07/23/24, Primary OA of Rt knee, Chest pain, Anxiety, Depression, Hip pain left, HLD, Hypertension  DIAGNOSTIC FINDINGS: *** DG Knee Right Port 07/23/2024 FINDINGS: Right knee arthroplasty in expected alignment. No periprosthetic lucency or fracture. There has been patellar resurfacing. Recent postsurgical change includes air and edema in the soft tissues  and joint space.   IMPRESSION: Right knee arthroplasty without immediate postoperative complication.  XR Knee 3 View Right 06/06/2024 X-rays demonstrate severe tricompartmental osteoarthritis. Bone-on-bone joint space narrowing of medial compartment.  Kellgren-Lawrence stage IV   PAIN:  NPRS scale: ***/10 Pain location: *** Pain description: *** Aggravating factors: *** Relieving factors: ***  PRECAUTIONS: {Therapy precautions:24002}  WEIGHT BEARING RESTRICTIONS: No  FALLS:  Has patient fallen in last 6 months? {fallsyesno:27318}  LIVING ENVIRONMENT: Lives with: {[facility] lives with:25569::lives with their family} Lives in: {Lives in:[address]70} Stairs: {opstairs:27293} Has following equipment at home: {Assistive devices:23999}  OCCUPATION: ***  PLOF: Independent  PATIENT GOALS: ***  Next MD visit: 09/04/2024  OBJECTIVE:   PATIENT SURVEYS:  Patient-Specific Activity Scoring Scheme  0 represents unable to perform. 10 represents able to perform at prior level. 0 1 2 3 4 5 6 7 8 9  10 (Date and Score)  Activity Eval     1. ***      2. ***      3. ***    4.    5.    Score ***    Total score = sum of the activity scores/number of activities Minimum detectable change (90%CI) for average score = 2 points Minimum detectable change (90%CI) for single activity score = 3 points  COGNITION: Overall cognitive status: WFL    SENSATION: {sensation:27233}  EDEMA:  {edema:24020} RLE: above knee ***cm  around knee cm  below knee cm LLE: above knee ***cm  around knee cm  below knee cm  MUSCLE LENGTH: Hamstrings: Right *** deg; Left *** deg Thomas test: Right *** deg; Left *** deg  POSTURE:  {posture:25561}  PALPATION: ***  LOWER EXTREMITY ROM:   ROM Right eval Left eval  Hip flexion    Hip extension    Hip abduction    Hip adduction    Hip internal rotation    Hip external rotation    Knee flexion    Knee extension    Ankle dorsiflexion    Ankle plantarflexion    Ankle inversion    Ankle eversion     (Blank rows = not tested)  LOWER EXTREMITY MMT:  MMT Right eval Left eval  Hip flexion    Hip extension    Hip abduction    Hip adduction    Hip internal rotation    Hip external rotation     Knee flexion    Knee extension    Ankle dorsiflexion    Ankle plantarflexion    Ankle inversion    Ankle eversion     (Blank rows = not tested)  LOWER EXTREMITY SPECIAL TESTS:  {LEspecialtests:26242}  FUNCTIONAL TESTS:  18 inch chair transfer: Lt SLS: Rt SLS:  GAIT: Distance walked: *** Assistive device utilized: {Assistive devices:23999} Level of assistance: {Levels of assistance:24026} Comments: ***  TODAY'S TREATMENT                                                                          DATE: *** Therapeutic Exercise: HEP instruction/performance c cues for techniques, handout provided.  Trial set performed of each for comprehension and symptom assessment.  See below for exercise list  PATIENT EDUCATION:  Education details: HEP, POC Person educated: Patient Education method: Explanation, Demonstration, Verbal cues, and Handouts Education comprehension: verbalized understanding, returned demonstration, and verbal cues required  HOME EXERCISE PROGRAM: ***  ASSESSMENT:  CLINICAL IMPRESSION: Patient is a 69 y.o. female who comes to clinic s/p Rt TKA with complaints of pain with mobility, strength and movement coordination deficits that impair their ability to perform usual daily and recreational functional activities without increase difficulty/symptoms at this time.  Patient to benefit from skilled PT services to address impairments and limitations to improve to previous level of function without restriction secondary to condition.   OBJECTIVE IMPAIRMENTS: {opptimpairments:25111}.   ACTIVITY LIMITATIONS: {activitylimitations:27494}  PARTICIPATION LIMITATIONS: {participationrestrictions:25113}  PERSONAL FACTORS: {Personal factors:25162} are also affecting patient's functional outcome.   REHAB  POTENTIAL: {rehabpotential:25112}  CLINICAL DECISION MAKING: {clinical decision making:25114}  EVALUATION COMPLEXITY: {Evaluation complexity:25115}   GOALS: Goals reviewed with patient? Yes  SHORT TERM GOALS: (target date for Short term goals ***)   1.  Patient will demonstrate independent use of home exercise program to maintain progress from in clinic treatments.  Goal status: New  LONG TERM GOALS: (target dates for all long term goals  *** )   1. Patient will demonstrate/report pain at worst less than or equal to 2/10 to facilitate minimal limitation in daily activity secondary to pain symptoms. Goal status: New   2. Patient will demonstrate independent use of home exercise program to facilitate ability to maintain/progress functional gains from skilled physical therapy services. Goal status: New   3. Patient will demonstrate Patient specific functional scale avg > or = *** to indicate reduced disability due to condition.  Goal status: New   4.  Patient will demonstrate *** LE MMT 5/5 throughout to faciltiate usual transfers, stairs, squatting at [facility] for daily life.  Goal status: New   5.  Patient will demonstrate Goal status: New   6.  *** Goal status: New   7.  *** Goal Status: New   PLAN:  PT FREQUENCY: {rehab frequency:25116}  PT DURATION: {rehab duration:25117}  PLANNED INTERVENTIONS: {rehab planned interventions:25118::97110-Therapeutic exercises,97530- Therapeutic [phone]- Neuromuscular re-education,97535- Self Rjmz,02859- Manual therapy,Patient/Family education}  PLAN FOR NEXT SESSION: ***   Sherline Babe, Student-PT 08/09/2024, 7:50 AM     "

## 2024-08-14 ENCOUNTER — Ambulatory Visit: Admitting: Physical Therapy

## 2024-08-14 ENCOUNTER — Ambulatory Visit: Admitting: Radiology

## 2024-09-04 ENCOUNTER — Encounter: Admitting: Physician Assistant

## 2024-10-10 ENCOUNTER — Other Ambulatory Visit

## 2025-03-28 ENCOUNTER — Ambulatory Visit: Admitting: Adult Health
